# Patient Record
Sex: Female | Born: 1970
Health system: Southern US, Community
[De-identification: ages and names within clinical notes are randomized; demographics above are authoritative.]

## PROBLEM LIST (undated history)

## (undated) DIAGNOSIS — Z803 Family history of malignant neoplasm of breast: Secondary | ICD-10-CM

## (undated) DIAGNOSIS — Z227 Latent tuberculosis: Secondary | ICD-10-CM

## (undated) DIAGNOSIS — R112 Nausea with vomiting, unspecified: Secondary | ICD-10-CM

## (undated) DIAGNOSIS — Z8042 Family history of malignant neoplasm of prostate: Secondary | ICD-10-CM

## (undated) DIAGNOSIS — Z9889 Other specified postprocedural states: Secondary | ICD-10-CM

## (undated) DIAGNOSIS — K219 Gastro-esophageal reflux disease without esophagitis: Secondary | ICD-10-CM

## (undated) DIAGNOSIS — T7840XA Allergy, unspecified, initial encounter: Secondary | ICD-10-CM

## (undated) DIAGNOSIS — C50919 Malignant neoplasm of unspecified site of unspecified female breast: Secondary | ICD-10-CM

## (undated) DIAGNOSIS — Z800 Family history of malignant neoplasm of digestive organs: Secondary | ICD-10-CM

## (undated) HISTORY — DX: Gastro-esophageal reflux disease without esophagitis: K21.9

## (undated) HISTORY — PX: WISDOM TOOTH EXTRACTION: SHX21

## (undated) HISTORY — DX: Family history of malignant neoplasm of digestive organs: Z80.0

## (undated) HISTORY — DX: Latent tuberculosis: Z22.7

## (undated) HISTORY — DX: Family history of malignant neoplasm of breast: Z80.3

## (undated) HISTORY — PX: DILATION AND CURETTAGE OF UTERUS: SHX78

## (undated) HISTORY — DX: Family history of malignant neoplasm of prostate: Z80.42

## (undated) HISTORY — DX: Malignant neoplasm of unspecified site of unspecified female breast: C50.919

## (undated) HISTORY — DX: Allergy, unspecified, initial encounter: T78.40XA

---

## 2003-12-26 ENCOUNTER — Emergency Department (HOSPITAL_COMMUNITY): Admission: AD | Admit: 2003-12-26 | Discharge: 2003-12-26 | Payer: Self-pay | Admitting: Family Medicine

## 2004-02-27 ENCOUNTER — Other Ambulatory Visit: Admission: RE | Admit: 2004-02-27 | Discharge: 2004-02-27 | Payer: Self-pay | Admitting: Obstetrics and Gynecology

## 2004-12-18 ENCOUNTER — Ambulatory Visit: Payer: Self-pay | Admitting: Internal Medicine

## 2005-01-13 ENCOUNTER — Other Ambulatory Visit: Admission: RE | Admit: 2005-01-13 | Discharge: 2005-01-13 | Payer: Self-pay | Admitting: Obstetrics and Gynecology

## 2005-01-21 ENCOUNTER — Encounter (INDEPENDENT_AMBULATORY_CARE_PROVIDER_SITE_OTHER): Payer: Self-pay | Admitting: *Deleted

## 2005-01-21 ENCOUNTER — Ambulatory Visit (HOSPITAL_COMMUNITY): Admission: RE | Admit: 2005-01-21 | Discharge: 2005-01-21 | Payer: Self-pay | Admitting: Obstetrics and Gynecology

## 2005-07-09 ENCOUNTER — Encounter (INDEPENDENT_AMBULATORY_CARE_PROVIDER_SITE_OTHER): Payer: Self-pay | Admitting: Specialist

## 2005-07-09 ENCOUNTER — Ambulatory Visit (HOSPITAL_COMMUNITY): Admission: RE | Admit: 2005-07-09 | Discharge: 2005-07-09 | Payer: Self-pay | Admitting: Obstetrics and Gynecology

## 2005-11-19 ENCOUNTER — Ambulatory Visit: Payer: Self-pay | Admitting: Internal Medicine

## 2006-10-04 ENCOUNTER — Encounter: Admission: RE | Admit: 2006-10-04 | Discharge: 2006-11-07 | Payer: Self-pay | Admitting: Obstetrics and Gynecology

## 2006-10-24 ENCOUNTER — Observation Stay (HOSPITAL_COMMUNITY): Admission: AD | Admit: 2006-10-24 | Discharge: 2006-10-25 | Payer: Self-pay | Admitting: Obstetrics and Gynecology

## 2006-11-05 ENCOUNTER — Inpatient Hospital Stay (HOSPITAL_COMMUNITY): Admission: AD | Admit: 2006-11-05 | Discharge: 2006-11-05 | Payer: Self-pay | Admitting: Obstetrics and Gynecology

## 2006-11-08 ENCOUNTER — Encounter (INDEPENDENT_AMBULATORY_CARE_PROVIDER_SITE_OTHER): Payer: Self-pay | Admitting: Specialist

## 2006-11-08 ENCOUNTER — Inpatient Hospital Stay (HOSPITAL_COMMUNITY): Admission: AD | Admit: 2006-11-08 | Discharge: 2006-11-11 | Payer: Self-pay | Admitting: Obstetrics and Gynecology

## 2007-06-03 ENCOUNTER — Emergency Department (HOSPITAL_COMMUNITY): Admission: EM | Admit: 2007-06-03 | Discharge: 2007-06-03 | Payer: Self-pay | Admitting: Emergency Medicine

## 2007-07-29 DIAGNOSIS — J309 Allergic rhinitis, unspecified: Secondary | ICD-10-CM | POA: Insufficient documentation

## 2007-08-16 ENCOUNTER — Ambulatory Visit: Payer: Self-pay | Admitting: Internal Medicine

## 2008-06-12 ENCOUNTER — Emergency Department (HOSPITAL_COMMUNITY): Admission: EM | Admit: 2008-06-12 | Discharge: 2008-06-12 | Payer: Self-pay | Admitting: Family Medicine

## 2008-06-27 ENCOUNTER — Ambulatory Visit: Payer: Self-pay | Admitting: Internal Medicine

## 2009-05-31 ENCOUNTER — Ambulatory Visit: Payer: Self-pay | Admitting: Internal Medicine

## 2010-07-23 ENCOUNTER — Encounter: Admission: RE | Admit: 2010-07-23 | Discharge: 2010-07-23 | Payer: Self-pay | Admitting: Obstetrics and Gynecology

## 2011-01-04 ENCOUNTER — Encounter: Payer: Self-pay | Admitting: Obstetrics and Gynecology

## 2011-05-01 NOTE — Op Note (Signed)
NAME:  Megan [name], Megan [name] NO.:  [id number]   MEDICAL RECORD NO.:  [id number]          PATIENT TYPE:  INP   LOCATION:  9199                          FACILITY:  [facility]   PHYSICIAN:  Juluis Mire, M.D.   DATE OF BIRTH:  [birth year]   DATE OF PROCEDURE:  11/08/2006  DATE OF DISCHARGE:                               OPERATIVE REPORT   PREOP DIAGNOSIS:  Intrauterine pregnancy at 35 weeks with spontaneous  rupture of membranes; footling breech presentation;  and advanced  cervical dilatation.   POSTOP DIAGNOSIS:  Intrauterine pregnancy at 35 weeks with spontaneous  rupture of membranes; footling breech presentation;  and advanced  cervical dilatation.   PROCEDURE:  Low transverse cesarean section; however, we did have to T  the uterine incision.   ANESTHESIA:  Spinal.   ESTIMATED BLOOD LOSS:  Approximately 5-600 mL.   PACKS AND DRAINS:  None.   INTRAOPERATIVE BLOOD:  None.   COMPLICATIONS:  None.   INDICATIONS:  A  40 year old female presents to [facility] with spontaneous  rupture of membranes at 35 weeks.  Exams revealed a footling breech with  almost complete cervical dilatation and feet present in the vagina.  Her  prenatal course was complicated by gestational diabetes requiring  metformin.  The decision was to proceed with urgent cesarean section.  The risks were discussed with the risk of infection.  The risk of  hemorrhage that could require transfusion, risk of AIDS or hepatitis.  Risk of injury to adjacent organs.  This includes bladder, bowel,  ureters that could require further exploratory surgery.  Risk of deep  venous thrombosis and pulmonary embolus.  The patient expressed  understanding of the indications and risks   DESCRIPTION OF PROCEDURE:  The patient was taken to the OR, placed in  supine position with left lateral tilt.  After satisfactory level spinal  anesthesia obtained, the abdomen prepped out with Betadine and draped  into a sterile field.   A low transverse skin incision made with a knife and carried through  subcutaneous tissue.  The fascia was entered sharp and the incision  fashioned laterally, fascia taken off the muscle superiorly inferiorly.  Rectus muscles were separated midline.  The anterior peritoneum was  entered sharply and this incision into the peritoneum extended both  superiorly inferiorly.  A low transverse bladder flap was developed,  a  low transverse uterine incision begun with a knife extending laterally  using manual traction.   The infant was in footling breech  presentation.  We first delivered the  legs and we could get the baby down to the head and the uterus  contracted around that.  We had to T uterine incision to complete the  delivery the head. The infant was a viable female weighing was 5 pounds 11  ounces, Apgars were 7/9.  Umbilical artery pH was 7.26.  Placenta was  delivered manually.  Uterus was exteriorized for closure.   First the T part of the uterine incision was closed with a running  locking suture of zero chromic using a two-layer closure technique.  Then  the low transverse incision was closed with a running locking  suture of zero chromic using a two-layer closure technique.  Some  bleeding from the left side, brought under control with figure-of-eights  of zero chromic.  Urine output remained clear and adequate.  The uterus  was returned to the abdominal cavity.  We thoroughly irrigated the  pelvis and had good hemostasis.  Tubes and ovaries unremarkable.  Muscles and peritoneum closed with a suture of 3-0 Vicryl.  Fascia was  with a suture of zero PDS.  Skin was closed with staples and Steri-  Strips.  Sponge, instrument count reported as correct by circulating  nurse x2.  Foley catheter remained clear at time of closure.   The patient tolerated the procedure well and was returned to the  recovery room in good condition.      Juluis Mire, M.D.  Electronically  Signed     JSM/MEDQ  D:  11/08/2006  T:  11/08/2006  Job:  [id number]

## 2011-05-01 NOTE — Op Note (Signed)
NAME:  Megan [name], Megan [name]                ACCOUNT NO.:  [id number]   MEDICAL RECORD NO.:  [id number]          PATIENT TYPE:  AMB   LOCATION:  SDC                           [facility]ACILITY:  [facility]   PHYSICIAN:  Dineen Kid. Rana Snare, M.D.    DATE OF BIRTH:  1971-04-18   DATE OF PROCEDURE:  01/21/2005  DATE OF DISCHARGE:                                 OPERATIVE REPORT   PREOPERATIVE DIAGNOSES:  Missed abortion eight weeks estimated gestational  size.   POSTOPERATIVE DIAGNOSES:  Missed abortion eight weeks estimated gestational  size.   SURGEON:  Dineen Kid. Rana Snare, M.D.   PROCEDURE:  Dilation evacuation.   INDICATIONS FOR PROCEDURE:  Ms. Laski is a 40 year old, G2, P1 who  presented to the office for new OB evaluation.  Unable to hear the fetal  heart tones, she underwent an ultrasound showing an approximately eight week  size fetal demise consistent with a missed abortion. She presents today for  dilation and evacuation. The risks and benefits were discussed, informed  consent was obtained. Blood type is O positive.   ANESTHESIA:  Monitored anesthetic care and paracervical block.   DESCRIPTION OF PROCEDURE:  After adequate analgesia, the patient was placed  in the dorsal lithotomy position, she was sterilely prepped and draped. The  bladder was sterilely drained. A Graves speculum was placed, Tenaculum was  placed on the anterior lip of the cervix, paracervical block was placed with  1% Xylocaine with 1:100,000 epinephrine, 20 mL total used.  The uterus  sounded to 9 cm, use of the dilator to a #27 Pratt dilator. An 8 mm suction  curette was inserted, products of conception were retrieved. This is  performed until all products were retrieved and a gritty surface felt  throughout the endometrial cavity. The patient was given Methergine 0.2 mg  IM with good unit response.  The curette was then removed from the uterus,  tenaculum was then removed, cervix was noted to be hemostatic. The speculum  was then  removed and the patient transferred to the recovery room in stable  condition.  The patient received Toradol 30 mg IV. Estimated blood loss was  minimal.   DISPOSITION:  The patient will be discharged home to followup in the office  in 2-3 weeks, sent home with a routine instruction sheet for D&E, also a  prescription for doxycycline 100 mg p.o. b.i.d. x7 days and Methergine 0.2  mg to take three times a day for two days.      DCL/MEDQ  D:  01/21/2005  T:  01/21/2005  Job:  [id number]

## 2011-05-01 NOTE — H&P (Signed)
NAME:  LURLIE, Megan [name]                ACCOUNT NO.:  [id number]   MEDICAL RECORD NO.:  [id number]          PATIENT TYPE:  AMB   LOCATION:  SDC                           [facility]   PHYSICIAN:  Dineen Kid. Rana Snare, M.D.    DATE OF BIRTH:  [birth year]   DATE OF ADMISSION:  DATE OF DISCHARGE:                                HISTORY & PHYSICAL   HISTORY OF PRESENT ILLNESS:  Megan [name] is a 40 year old G3 P1 who presented  to the office today with cramping. She is in the first trimester. Ultrasound  evaluation today shows a fetus measuring 7-and-a-half weeks gestational age  with no fetal heart activity consistent with a fetal demise. She presents  for dilatation and evacuation. She had a previous miscarriage earlier this  year in February that required a D&E as well. Her blood type is O positive.   PAST MEDICAL HISTORY:  Negative.   PAST SURGICAL HISTORY:  Significant only for a D&E in February. She has had  a vaginal delivery in 2003.   PHYSICAL EXAMINATION:  VITAL SIGNS:  Her blood pressure is 118/60.  HEART:  Regular rate and rhythm.  LUNGS:  Clear to auscultation bilaterally.  ABDOMEN:  Nondistended, nontender. Uterus by ultrasound measuring 7-week  size consistent with a missed abortion.   IMPRESSION AND PLAN:  Missed abortion, approximately 7-and-a-half weeks  size, 9 weeks estimated gestational age. The patient desires dilation and  evacuation. The risks and benefits of the procedure were discussed at length  which include but are not limited to risk of infection, bleeding, damage to  uterus, tubes, ovaries, bowel, or bladder. Her blood type is O positive. She  does give her informed consent and wished to proceed. I did discuss  laboratory evaluation for habitual aborter. We will go ahead and perform  preliminary work with antinuclear antibody, lupus anticoagulant,  anticardiolipin antibody, glucose level, thyroid function test.       DCL/MEDQ  D:  07/08/2005  T:  07/08/2005   Job:  [id number]

## 2011-05-01 NOTE — Discharge Summary (Signed)
NAME:  Megan [name], Megan [name] NO.:  [id number]   MEDICAL RECORD NO.:  [id number]          PATIENT TYPE:  INP   LOCATION:  9110                          [facility]ACILITY:  [facility]   PHYSICIAN:  Zelphia Cairo, MD    DATE OF BIRTH:  1971-04-20   DATE OF ADMISSION:  11/08/2006  DATE OF DISCHARGE:  11/11/2006                               DISCHARGE SUMMARY   ADMITTING DIAGNOSES:  1. Intrauterine pregnancy at 35 weeks estimated gestational age.  2. Spontaneous rupture of membranes.  3. Footling breech presentation.   DISCHARGE DIAGNOSES:  1. Status post low transverse cesarean section.  2. Viable female infant.   PROCEDURE:  Primary low transverse cesarean section with T extension of  the incisional site.   REASON FOR ADMISSION:  Please see written H&P.   HOSPITAL COURSE:  The patient was a 40 year old female that presented to  [facility] with spontaneous rupture of membranes at 35  weeks estimated gestational age.  The exam revealed a footling breech  with almost complete cervical dilatation and feet present in the vagina.   The patient's prenatal course had been complicated by gestational  diabetes requiring metformin.  Decision was made to proceed with a  primary low transverse cesarean section.  The patient was then  transferred to the operating room, where spinal anesthesia was  administered without difficulty.  A low transverse incision was made  with delivery of the legs, so that we could get the baby down; but the  head was contracted by the uterus.  Therefore, a T uterine incision was  completed to deliver the head.  A viable female infant was delivered  weighing 5 pounds 11 ounces; Apgar's of 7 at one minute and 9 at 5  minutes.  Umbilical artery pH was 7.26.   The patient tolerated the procedure well and was taken to the recovery  room in stable condition.  In the recovery room the patient was noted to  have some decreased urine output.  IV fluids were  increased, with  increasing urine output.  On the following morning the patient was  without complaint.  She denied any dizziness.  She was ambulating well.  Vital signs were stable with temperature current 99.3.  Abdomen was  soft.  Fundus firm and nontender.  Abdominal dressing was noted to be  clean, dry and intact.  The patient was now voiding adequately.  Hemoglobin 8.5, platelet count 164,000, WBC count 23.1.  Fasting blood  sugar was 109 mg/dL.  CBC was ordered for the a.m. and the patient was  started on some iron supplementation.   On postoperative day #2 the patient was without complaint.  Vital signs  remained stable.  Abdomen soft.  Fundus firm and nontender.  Incision  was clean, dry and intact.  Repeat laboratory data revealed hemoglobin  of 8.1, platelet count 198,000, WBC count of 21.7.  Fasting blood sugar  was 219 mg/dL; however, the patient was noted to have orange juice prior  to fasting blood sugar being drawn.   On the following morning the patient was without complaint.  Vital signs  were stable.  Abdomen soft.  Fundus firm and nontender.  Incision was  clean, dry and intact.  Staples removed and the patient was later  discharged home.   CONDITION ON DISCHARGE:  Good.   DIET:  Regular as tolerated.   ACTIVITY:  No heavy lifting, no driving x2 weeks, no vaginal entry.   FOLLOW-UP:  To follow up in the office in 1-2 weeks for incision check.  She is to call for temperature greater than 100 degrees, persistent  nausea, vomiting, heavy vaginal bleeding and/or redness or drainage from  the incisional site.   DISCHARGE MEDICATIONS:  1. Percocet 5/325 mg (#31) p.o. q.4-6h. p.r.n.  2. Motrin 600 mg every 6 hours.  3. Prenatal vitamins one p.o. daily.  4. Colace one p.o. daily.      Julio Sicks, N.P.      Zelphia Cairo, MD  Electronically Signed    CC/MEDQ  D:  12/20/2006  T:  12/20/2006  Job:  [id number]

## 2011-05-01 NOTE — Op Note (Signed)
NAME:  Megan [name], Megan [name]                ACCOUNT NO.:  [id number]   MEDICAL RECORD NO.:  [id number]          PATIENT TYPE:  AMB   LOCATION:  SDC                           [facility]   PHYSICIAN:  Dineen Kid. Rana Snare, M.D.    DATE OF BIRTH:  09/26/1971   DATE OF PROCEDURE:  07/09/2005  DATE OF DISCHARGE:                                 OPERATIVE REPORT   PREOPERATIVE DIAGNOSIS:  Missed abortion at approximately 7-1/2 weeks'  gestational size.   POSTOPERATIVE DIAGNOSIS:  Missed abortion at approximately 7-1/2 weeks'  gestational size.   PROCEDURE:  Dilation and evacuation.   SURGEON:  Dineen Kid. Rana Snare, M.D.   ANESTHESIA:  Monitored anesthesia care and local by paracervical block.   INDICATIONS:  [name] is a 40 year old G3, P1, who presented to the  office with cramping.  Ultrasound shows a 7-1/2 week size fetus with no  fetal heart activity consistent with fetal demise.  She desires dilation and  evacuation.  The risks and benefits were discussed at length and informed  consent was obtained.  Blood type is O positive.   DESCRIPTION OF PROCEDURE:  After adequate analgesia, the patient was placed  in the dorsal lithotomy position, where she was sterilely prepped and  draped, the bladder was sterilely drained, a Graves speculum was placed.  A  tenaculum was placed on the anterior lip of the cervix.  A paracervical  block was placed with 1% Xylocaine and 1:100,000 epinephrine 20 mL total  used.  The uterus was sounded to 9 cm, easily dilated to a #27 Pratt  dilator.  A 7 mm suction curette was inserted.  Products of conception were  retrieved.  Suction curettage was performed until no residual products were  being retrieved and a gritty surface felt throughout the endometrial cavity.  The patient was given Methergine 0.2 mg IM and a good uterine response  noted.  The curette was then removed, the tenaculum released from the  cervix.  Hemostasis was achieved with direct pressure.  The  speculum was  then removed and the patient was transferred to the recovery room in stable  condition.  The patient received Toradol 30 mg IV.  Estimated blood loss was  minimal.   DISPOSITION:  The patient will be discharged home and will follow up in the  office in three weeks.  Sent home with a routine instruction sheet for D&E,  told to return for any increased pain, fever or bleeding.  Given a  prescription for Methergine 0.2 mg to take every eight hours for two days  and doxycycline 100 mg p.o. b.i.d. x7 days.       DCL/MEDQ  D:  07/09/2005  T:  07/09/2005  Job:  [id number]

## 2011-06-23 ENCOUNTER — Other Ambulatory Visit: Payer: Self-pay | Admitting: Internal Medicine

## 2011-12-31 ENCOUNTER — Ambulatory Visit (INDEPENDENT_AMBULATORY_CARE_PROVIDER_SITE_OTHER): Payer: 59 | Admitting: Family

## 2011-12-31 ENCOUNTER — Encounter: Payer: Self-pay | Admitting: Family

## 2011-12-31 VITALS — BP 110/82 | Temp 98.4°F | Wt 155.0 lb

## 2011-12-31 DIAGNOSIS — R05 Cough: Secondary | ICD-10-CM

## 2011-12-31 DIAGNOSIS — J069 Acute upper respiratory infection, unspecified: Secondary | ICD-10-CM

## 2011-12-31 MED ORDER — AMOXICILLIN-POT CLAVULANATE 875-125 MG PO TABS: 1.0000 | ORAL_TABLET | Freq: Two times a day (BID) | ORAL | Status: AC

## 2011-12-31 NOTE — Progress Notes (Signed)
  Subjective:    Patient ID: Megan [name], female    DOB: 10-01-1971, 41 y.o.   MRN: [id number]  HPI A 41 year old white female, nonsmoker, patient of Dr. Cato Mulligan is in today with complaints of cough, chest congestion, nasal congestion and one of her mouth. Her symptoms wax and wane. She's been taking Mucinex, Robitussin, and Flonase was little to no relief. She denies any fever, muscle aches or pain, shortness of breath, chest pain or palpitations.    Review of Systems  Constitutional: Negative.   HENT: Positive for congestion and postnasal drip.   Respiratory: Positive for cough.   Cardiovascular: Negative.   Genitourinary: Negative.   Musculoskeletal: Negative.   Skin: Negative.   Neurological: Negative.   Hematological: Negative.   Psychiatric/Behavioral: Negative.    Past Medical History  Diagnosis Date  . Allergic rhinitis     History   Social History  . Marital Status: Married    Spouse Name: N/A    Number of Children: N/A  . Years of Education: N/A   Occupational History  . Not on file.   Social History Main Topics  . Smoking status: Never Smoker   . Smokeless tobacco: Not on file  . Alcohol Use: No  . Drug Use: No  . Sexually Active: Not on file   Other Topics Concern  . 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 Wisdom tooth extraction     Family History  Problem Relation Age of Onset  . Breast cancer    . Hyperlipidemia    . Hypertension    . Stroke      No Known Allergies  Current Outpatient Prescriptions on File Prior to Visit  Medication Sig Dispense Refill  . fluticasone (FLONASE) 50 MCG/ACT nasal spray USE 2 SPRAYS IN EACH NOSTRIL DAILY  16 g  2    BP 110/82  Temp(Src) 98.4 F (36.9 C) (Oral)  Wt 155 lb (70.308 kg)chart   Objective:   Physical Exam  Constitutional: She is oriented to person, place, and time. She appears well-developed and  well-nourished.  HENT:  Right Ear: External ear normal.  Left Ear: External ear normal.  Nose: Nose normal.  Mouth/Throat: Oropharynx is clear and moist.  Neck: Neck supple.  Cardiovascular: Normal rate and normal heart sounds.   Pulmonary/Chest: Effort normal and breath sounds normal.  Neurological: She is alert and oriented to person, place, and time.  Skin: Skin is warm and dry.  Psychiatric: She has a normal mood and affect.          Assessment & Plan:  Assessment: Upper respiratory infection, cough  Plan: Augmentin 875 one by mouth twice a day x10 days. Continue Mucinex D. over-the-counter rest. Drink clear fluids. As his symptoms worsen or persist. Recheck as scheduled, and when necessary.

## 2011-12-31 NOTE — Patient Instructions (Signed)

## 2012-05-16 ENCOUNTER — Other Ambulatory Visit: Payer: Self-pay | Admitting: Internal Medicine

## 2012-06-07 ENCOUNTER — Ambulatory Visit (INDEPENDENT_AMBULATORY_CARE_PROVIDER_SITE_OTHER): Payer: 59 | Admitting: Family Medicine

## 2012-06-07 ENCOUNTER — Encounter: Payer: Self-pay | Admitting: Family Medicine

## 2012-06-07 VITALS — BP 106/70 | HR 91 | Temp 98.7°F | Wt 151.0 lb

## 2012-06-07 DIAGNOSIS — L259 Unspecified contact dermatitis, unspecified cause: Secondary | ICD-10-CM

## 2012-06-07 MED ORDER — METHYLPREDNISOLONE ACETATE 80 MG/ML IJ SUSP
120.0000 mg | Freq: Once | INTRAMUSCULAR | Status: AC
  Administered 2012-06-07: 120 mg via INTRAMUSCULAR

## 2012-06-07 MED ORDER — FLUTICASONE PROPIONATE 50 MCG/ACT NA SUSP: 2.0000 | Freq: Every day | NASAL | Status: DC

## 2012-06-07 MED ORDER — METHYLPREDNISOLONE 4 MG PO KIT: PACK | ORAL | Status: AC

## 2012-06-07 NOTE — Progress Notes (Signed)
  Subjective:    Patient ID: Megan [name], female    DOB: June 04, 1971, 41 y.o.   MRN: [id number]  HPI Here for itchy red rashes all over her body that started 4 days ago. Using Benadryl. She thinks this came from clearing some weeds from around her house.    Review of Systems  Constitutional: Negative.   Skin: Positive for rash.       Objective:   Physical Exam  Constitutional: She appears well-developed and well-nourished.  Skin:       Widespread small patches of red macules and vesicles on arms, legs, neck, and face           Assessment & Plan:  Recheck prn

## 2012-06-07 NOTE — Addendum Note (Signed)
Addended by: Aniceto Boss A on: 06/07/2012 03:32 PM   Modules accepted: Orders

## 2012-08-17 ENCOUNTER — Other Ambulatory Visit: Payer: Self-pay | Admitting: Obstetrics and Gynecology

## 2012-08-17 DIAGNOSIS — R928 Other abnormal and inconclusive findings on diagnostic imaging of breast: Secondary | ICD-10-CM

## 2012-08-19 ENCOUNTER — Ambulatory Visit
Admission: RE | Admit: 2012-08-19 | Discharge: 2012-08-19 | Disposition: A | Discharge status: Home / Self Care | Payer: 59 | Source: Ambulatory Visit | Attending: Obstetrics and Gynecology | Admitting: Obstetrics and Gynecology

## 2012-08-19 ENCOUNTER — Other Ambulatory Visit: Payer: Self-pay | Admitting: Obstetrics and Gynecology

## 2012-08-19 DIAGNOSIS — R928 Other abnormal and inconclusive findings on diagnostic imaging of breast: Secondary | ICD-10-CM

## 2012-08-24 ENCOUNTER — Other Ambulatory Visit: Payer: Self-pay | Admitting: Diagnostic Radiology

## 2012-08-24 ENCOUNTER — Ambulatory Visit
Admission: RE | Admit: 2012-08-24 | Discharge: 2012-08-24 | Disposition: A | Discharge status: Home / Self Care | Payer: 59 | Source: Ambulatory Visit | Attending: Obstetrics and Gynecology | Admitting: Obstetrics and Gynecology

## 2012-08-24 ENCOUNTER — Other Ambulatory Visit: Payer: Self-pay | Admitting: Obstetrics and Gynecology

## 2012-08-24 DIAGNOSIS — R928 Other abnormal and inconclusive findings on diagnostic imaging of breast: Secondary | ICD-10-CM

## 2012-08-24 HISTORY — PX: BREAST BIOPSY: SHX20

## 2013-03-14 ENCOUNTER — Other Ambulatory Visit: Payer: Self-pay | Admitting: Occupational Medicine

## 2013-03-14 ENCOUNTER — Ambulatory Visit: Payer: Self-pay

## 2013-03-14 DIAGNOSIS — R7612 Nonspecific reaction to cell mediated immunity measurement of gamma interferon antigen response without active tuberculosis: Secondary | ICD-10-CM

## 2013-07-31 ENCOUNTER — Other Ambulatory Visit: Payer: Self-pay | Admitting: Family Medicine

## 2013-09-08 ENCOUNTER — Other Ambulatory Visit: Payer: Self-pay

## 2013-09-08 DIAGNOSIS — Z1231 Encounter for screening mammogram for malignant neoplasm of breast: Secondary | ICD-10-CM

## 2013-09-26 ENCOUNTER — Ambulatory Visit: Payer: Self-pay

## 2013-10-10 ENCOUNTER — Ambulatory Visit
Admission: RE | Admit: 2013-10-10 | Discharge: 2013-10-10 | Disposition: A | Discharge status: Home / Self Care | Payer: 59 | Source: Ambulatory Visit

## 2013-10-10 DIAGNOSIS — Z1231 Encounter for screening mammogram for malignant neoplasm of breast: Secondary | ICD-10-CM

## 2013-11-17 ENCOUNTER — Encounter: Payer: Self-pay | Admitting: Family Medicine

## 2013-11-17 ENCOUNTER — Ambulatory Visit (INDEPENDENT_AMBULATORY_CARE_PROVIDER_SITE_OTHER): Payer: 59 | Admitting: Family Medicine

## 2013-11-17 VITALS — BP 110/78 | Temp 99.8°F | Wt 158.0 lb

## 2013-11-17 DIAGNOSIS — J309 Allergic rhinitis, unspecified: Secondary | ICD-10-CM

## 2013-11-17 DIAGNOSIS — Z23 Encounter for immunization: Secondary | ICD-10-CM

## 2013-11-17 MED ORDER — FLUTICASONE PROPIONATE 50 MCG/ACT NA SUSP: 2.0000 | Freq: Every day | NASAL | Status: DC

## 2013-11-17 NOTE — Progress Notes (Signed)
Chief Complaint  Patient presents with  . seasonal allergies    HPI:  Acute visit for:  Nasal Congestion: -reports this is her allergies -she reports has had this chronically and used flonase in the past and needs refills -symptoms: sneezing, nasal congestion, itchy eyes -has been using zyrtec which helps a little -denies: fevers, chills, sinus pain, ear pain, wheezing, cough, SOB  ROS: See pertinent positives and negatives per HPI.  Past Medical History  Diagnosis Date  . Allergic rhinitis     Past Surgical History  Procedure Laterality Date  . Wisdom tooth extraction      Family History  Problem Relation Age of Onset  . Breast cancer    . Hyperlipidemia    . Hypertension    . Stroke      History   Social History  . Marital Status: Married    Spouse Name: N/A    Number of Children: N/A  . Years of Education: N/A   Social History Main Topics  . Smoking status: Never Smoker   . Smokeless tobacco: Never Used  . Alcohol Use: Yes     Comment: twice a year  . Drug Use: No  . Sexual Activity: None   Other Topics Concern  . None   Social History Narrative   Occupation: Charity fundraiser   Married   Regular exercise- no    Current outpatient prescriptions:Calcium-Vitamin D (CALTRATE 600 PLUS-VIT D PO), Take by mouth., Disp: , Rfl: ;  oxymetazoline (AFRIN) 0.05 % nasal spray, Place 2 sprays into the nose 2 (two) times daily., Disp: , Rfl: ;  rifampin (RIFADIN) 300 MG capsule, Take 600 mg by mouth daily., Disp: , Rfl: ;  fluticasone (FLONASE) 50 MCG/ACT nasal spray, Place 2 sprays into both nostrils daily., Disp: 16 g, Rfl: 6  EXAM:  Filed Vitals:   11/17/13 0838  BP: 110/78  Temp: 99.8 F (37.7 C)    There is no height on file to calculate BMI.  GENERAL: vitals reviewed and listed above, alert, oriented, appears well hydrated and in no acute distress  HEENT: atraumatic, conjunttiva clear, no obvious abnormalities on inspection of external nose and ears  NECK: no  obvious masses on inspection  LUNGS: clear to auscultation bilaterally, no wheezes, rales or rhonchi, good air movement  CV: HRRR, no peripheral edema  MS: moves all extremities without noticeable abnormality  PSYCH: pleasant and cooperative, no obvious depression or anxiety  ASSESSMENT AND PLAN:  Discussed the following assessment and plan:  Need for prophylactic vaccination and inoculation against influenza - Plan: Flu Vaccine QUAD 36+ mos PF IM (Fluarix)  ALLERGIC RHINITIS - Plan: fluticasone (FLONASE) 50 MCG/ACT nasal spray  -refilled flonase -discussed allergy treatment -Patient advised to return or notify a doctor immediately if symptoms worsen or persist or new concerns arise.  There are no Patient Instructions on file for this visit.   Kriste Basque R.

## 2013-11-17 NOTE — Progress Notes (Signed)
Pre visit review using our clinic review tool, if applicable. No additional management support is needed unless otherwise documented below in the visit note. 

## 2014-10-26 ENCOUNTER — Other Ambulatory Visit: Payer: Self-pay

## 2014-10-26 DIAGNOSIS — Z1231 Encounter for screening mammogram for malignant neoplasm of breast: Secondary | ICD-10-CM

## 2014-11-20 ENCOUNTER — Ambulatory Visit
Admission: RE | Admit: 2014-11-20 | Discharge: 2014-11-20 | Disposition: A | Discharge status: Home / Self Care | Payer: 59 | Source: Ambulatory Visit

## 2014-11-20 DIAGNOSIS — Z1231 Encounter for screening mammogram for malignant neoplasm of breast: Secondary | ICD-10-CM

## 2014-12-11 ENCOUNTER — Telehealth: Payer: Self-pay | Admitting: Internal Medicine

## 2014-12-11 NOTE — Telephone Encounter (Signed)
Pt would like to switch to dr kim. Can I sch?

## 2014-12-11 NOTE — Telephone Encounter (Signed)
Sure. Please schedule npv. Thanks.

## 2014-12-13 NOTE — Telephone Encounter (Signed)
lmom for pt to cb

## 2014-12-17 NOTE — Telephone Encounter (Signed)
Pt has been sch

## 2015-01-22 ENCOUNTER — Ambulatory Visit (INDEPENDENT_AMBULATORY_CARE_PROVIDER_SITE_OTHER): Payer: 59 | Admitting: Family Medicine

## 2015-01-22 ENCOUNTER — Encounter: Payer: Self-pay | Admitting: Family Medicine

## 2015-01-22 VITALS — BP 128/80 | HR 71 | Temp 98.3°F | Ht 65.25 in | Wt 169.1 lb

## 2015-01-22 DIAGNOSIS — Z7689 Persons encountering health services in other specified circumstances: Secondary | ICD-10-CM

## 2015-01-22 DIAGNOSIS — Z7189 Other specified counseling: Secondary | ICD-10-CM

## 2015-01-22 DIAGNOSIS — Z0000 Encounter for general adult medical examination without abnormal findings: Secondary | ICD-10-CM

## 2015-01-22 DIAGNOSIS — R5383 Other fatigue: Secondary | ICD-10-CM

## 2015-01-22 DIAGNOSIS — Z23 Encounter for immunization: Secondary | ICD-10-CM

## 2015-01-22 NOTE — Progress Notes (Signed)
HPI:  Megan [name] is here to establish care. She used to see Dr. Leanne Chang. Sees Dr. Corinna Capra for physicals - has yearly paps as has remote hx mildly abnormal pap, mammos. Wants her basic labs, preventive care.  Has the following chronic problems that require follow up and concerns today:  Chronic fatigue: -started over 1 year ago: -feels like in good health, CV exercise on a regular basis, diet is pretty healthy -symptoms: mild fatigue intermittently and has had some weight gain over the last year -Wants to check labs -denies: weight loss, fevers, night sweats, cough, SOB or any other symptoms, anxiety or depression -hx positive quantiferon - s/p treatment, always had neg CXR, has had several PPD tests since which were negative as well -FDLMP: has IUD so rarely has menstrual bleeding  Vaccines: Tdap today  Declined STI testing  Seeing gyn for her breast cancer and followed more closely due to [place] breast Ca.  ROS negative for unless reported above: fevers, unintentional weight loss, hearing or vision loss, chest pain, palpitations, struggling to breath, hemoptysis, melena, hematochezia, hematuria, falls, loc, si, thoughts of self harm  Past Medical History  Diagnosis Date  . Allergic rhinitis   . TB lung, latent     +  quantiferon, neg PPD - s/p treatment - completed    Past Surgical History  Procedure Laterality Date  . Wisdom tooth extraction    . Dilation and curettage of uterus    . Cesarean section      Family History  Problem Relation Age of Onset  . Cancer Mother     breast  . Hypertension Mother   . Hyperlipidemia Father   . Diabetes Father   . Stroke Maternal Grandmother     History   Social History  . Marital Status: Married    Spouse Name: N/A    Number of Children: N/A  . Years of Education: N/A   Social History Main Topics  . Smoking status: Never Smoker   . Smokeless tobacco: Never Used  . Alcohol Use: Yes     Comment: twice a year  . Drug Use:  No  . Sexual Activity: None   Other Topics Concern  . 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  levonorgestrel (MIRENA) 20 MCG/24HR IUD, 1 each by Intrauterine route once., Disp: , Rfl:  .  Multiple Vitamin (MULTIVITAMIN) capsule, Take 1 capsule by mouth daily., Disp: , Rfl:   EXAM:  Filed Vitals:   01/22/15 1426  BP: 128/80  Pulse: 71  Temp: 98.3 F (36.8 C)    Body mass index is 27.94 kg/(m^2).  GENERAL: vitals reviewed and listed above, alert, oriented, appears well hydrated and in no acute distress  HEENT: atraumatic, conjunttiva clear, no obvious abnormalities on inspection of external nose and ears  NECK: no obvious masses on inspection  LUNGS: clear to auscultation bilaterally, no wheezes, rales or rhonchi, good air movement  CV: HRRR, no peripheral edema  ABD: BS+, soft, NTTP  GU: declined - sees gyn  BREAST: declined - sees gyn  MS: moves all extremities without noticeable abnormality  PSYCH: pleasant and cooperative, no obvious depression or anxiety  ASSESSMENT AND PLAN:  Discussed the following assessment and plan:  Visit for preventive health  examination - Plan: Lipid Panel, CMP, CBC with Differential, TSH  Other fatigue - Plan: CBC with Differential, TSH  Encounter to establish care   -We reviewed the PMH, PSH, FH, SH, Meds and Allergies. -We provided refills for any medications we will prescribe as needed. -We addressed current concerns per orders and patient instructions. -We have asked for records for pertinent exams, studies, vaccines and notes from previous providers. -We have advised patient to follow up per instructions below. -all uspstf level a and b recs discussed -she will set up FASTING lab appointment -Tdap   -Patient advised to return or notify  a doctor immediately if symptoms worsen or persist or new concerns arise.  Patient Instructions  BEFORE YOU LEAVE: -schedule fasting lab appointment - drink plenty of water -Tdap  We recommend the following healthy lifestyle measures: - eat a healthy diet consisting of lots of vegetables, fruits, beans, nuts, seeds, healthy meats such as white chicken and fish and whole grains.  - avoid fried foods, fast food, processed foods, sodas, red meet and other fattening foods.  - get a least 150 minutes of aerobic exercise per week.   Get at least 8 hours of sleep per night and try to allow yourself to sleep in one day per week if possible     KIM, HANNAH R.

## 2015-01-22 NOTE — Progress Notes (Signed)
Pre visit review using our clinic review tool, if applicable. No additional management support is needed unless otherwise documented below in the visit note. 

## 2015-01-22 NOTE — Addendum Note (Signed)
Addended by: Agnes Lawrence on: 01/22/2015 05:27 PM   Modules accepted: Orders

## 2015-01-22 NOTE — Patient Instructions (Signed)
BEFORE YOU LEAVE: -schedule fasting lab appointment - drink plenty of water -Tdap  We recommend the following healthy lifestyle measures: - eat a healthy diet consisting of lots of vegetables, fruits, beans, nuts, seeds, healthy meats such as white chicken and fish and whole grains.  - avoid fried foods, fast food, processed foods, sodas, red meet and other fattening foods.  - get a least 150 minutes of aerobic exercise per week.   Get at least 8 hours of sleep per night and try to allow yourself to sleep in one day per week if possible

## 2015-01-25 ENCOUNTER — Other Ambulatory Visit (INDEPENDENT_AMBULATORY_CARE_PROVIDER_SITE_OTHER): Payer: 59

## 2015-01-25 DIAGNOSIS — R5383 Other fatigue: Secondary | ICD-10-CM

## 2015-01-25 DIAGNOSIS — Z0000 Encounter for general adult medical examination without abnormal findings: Secondary | ICD-10-CM

## 2015-01-25 LAB — CBC WITH DIFFERENTIAL/PLATELET
BASOS PCT: 0.5 % (ref 0.0–3.0)
Basophils Absolute: 0 10*3/uL (ref 0.0–0.1)
Eosinophils Absolute: 0.1 10*3/uL (ref 0.0–0.7)
Eosinophils Relative: 1.3 % (ref 0.0–5.0)
HCT: 39.6 % (ref 36.0–46.0)
Hemoglobin: 13.5 g/dL (ref 12.0–15.0)
LYMPHS PCT: 28.3 % (ref 12.0–46.0)
Lymphs Abs: 1.8 10*3/uL (ref 0.7–4.0)
MCHC: 34.2 g/dL (ref 30.0–36.0)
MCV: 91.8 fl (ref 78.0–100.0)
Monocytes Absolute: 0.5 10*3/uL (ref 0.1–1.0)
Monocytes Relative: 8.5 % (ref 3.0–12.0)
Neutro Abs: 3.9 10*3/uL (ref 1.4–7.7)
Neutrophils Relative %: 61.4 % (ref 43.0–77.0)
PLATELETS: 251 10*3/uL (ref 150.0–400.0)
RBC: 4.32 Mil/uL (ref 3.87–5.11)
RDW: 13.3 % (ref 11.5–15.5)
WBC: 6.4 10*3/uL (ref 4.0–10.5)

## 2015-01-25 LAB — LIPID PANEL
Cholesterol: 147 mg/dL (ref 0–200)
HDL: 45.5 mg/dL (ref >39.00)
LDL Cholesterol: 82 mg/dL (ref 0–99)
NONHDL: 101.5
Total CHOL/HDL Ratio: 3
Triglycerides: 98 mg/dL (ref 0.0–149.0)
VLDL: 19.6 mg/dL (ref 0.0–40.0)

## 2015-01-25 LAB — COMPREHENSIVE METABOLIC PANEL
ALBUMIN: 4.4 g/dL (ref 3.5–5.2)
ALT: 16 U/L (ref 0–35)
AST: 16 U/L (ref 0–37)
Alkaline Phosphatase: 84 U/L (ref 39–117)
BILIRUBIN TOTAL: 0.7 mg/dL (ref 0.2–1.2)
BUN: 12 mg/dL (ref 6–23)
CO2: 28 mEq/L (ref 19–32)
Calcium: 9.3 mg/dL (ref 8.4–10.5)
Chloride: 105 mEq/L (ref 96–112)
Creatinine, Ser: 0.71 mg/dL (ref 0.40–1.20)
GFR: 96.24 mL/min (ref >60.00)
Glucose, Bld: 91 mg/dL (ref 70–99)
POTASSIUM: 4.1 meq/L (ref 3.5–5.1)
SODIUM: 138 meq/L (ref 135–145)
TOTAL PROTEIN: 7.4 g/dL (ref 6.0–8.3)

## 2015-01-25 LAB — TSH: TSH: 1.4 u[IU]/mL (ref 0.35–4.50)

## 2015-04-26 ENCOUNTER — Other Ambulatory Visit: Payer: Self-pay | Admitting: Obstetrics and Gynecology

## 2015-04-29 LAB — CYTOLOGY - PAP

## 2015-12-13 ENCOUNTER — Encounter: Payer: Self-pay | Admitting: Family Medicine

## 2015-12-13 ENCOUNTER — Ambulatory Visit (INDEPENDENT_AMBULATORY_CARE_PROVIDER_SITE_OTHER): Payer: 59 | Admitting: Family Medicine

## 2015-12-13 VITALS — BP 120/80 | HR 95 | Temp 98.6°F | Ht 65.25 in | Wt 169.9 lb

## 2015-12-13 DIAGNOSIS — J309 Allergic rhinitis, unspecified: Secondary | ICD-10-CM | POA: Diagnosis not present

## 2015-12-13 MED ORDER — FLUTICASONE PROPIONATE 50 MCG/ACT NA SUSP: 2.0000 | Freq: Every day | NASAL | Status: DC

## 2015-12-13 NOTE — Patient Instructions (Signed)
BEFORE YOU LEAVE: -schedule follow up in 1 month for allergies  Start flonase 2 sprays each nostril daily for 1 month, then 1 spray each nostril daily  Stop the claritin-D and start allegra or zyrtec daily

## 2015-12-13 NOTE — Progress Notes (Signed)
   HPI:  Acute visit for:  Allergic Rhinitis: -started: chronic, worse for several months -symptoms:nasal congestion, sneezing, itchy nose, PND -denies:fever, SOB, NVD, tooth pain, sinus pain -has tried: claritin d, used to use flonase and this worked great but then cost too much over the counter so stopped -sick contacts/travel/risks: denies flu exposure, tick exposure or or Ebola risks  ROS: See pertinent positives and negatives per HPI.  Past Medical History  Diagnosis Date  . Allergic rhinitis   . TB lung, latent     +  quantiferon, neg PPD - s/p treatment - completed    Past Surgical History  Procedure Laterality Date  . Wisdom tooth extraction    . Dilation and curettage of uterus    . Cesarean section      Family History  Problem Relation Age of Onset  . Cancer Mother     breast  . Hypertension Mother   . Hyperlipidemia Father   . Diabetes Father   . Stroke Maternal Grandmother     Social History   Social History  . Marital Status: Married    Spouse Name: N/A  . Number of Children: N/A  . Years of Education: N/A   Social History Main Topics  . Smoking status: Never Smoker   . Smokeless tobacco: Never Used  . Alcohol Use: Yes     Comment: twice a year  . Drug Use: No  . Sexual Activity: Not Asked   Other Topics Concern  . 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  levonorgestrel (MIRENA) 20 MCG/24HR IUD, 1 each by Intrauterine route once., Disp: , Rfl:  .  Multiple Vitamin (MULTIVITAMIN) capsule, Take 1 capsule by mouth daily., Disp: , Rfl:  .  fluticasone (FLONASE) 50 MCG/ACT nasal spray, Place 2 sprays into both nostrils daily., Disp: 16 g, Rfl: 6  EXAM:  Filed Vitals:   12/13/15 1051  BP: 120/80  Pulse: 95  Temp: 98.6 F (37 C)    Body mass index is 28.07  kg/(m^2).  GENERAL: vitals reviewed and listed above, alert, oriented, appears well hydrated and in no acute distress  HEENT: atraumatic, conjunttiva clear, no obvious abnormalities on inspection of external nose and ears, normal appearance of ear canals and TMs, clear nasal congestion, boggy pale turbinates, mild post oropharyngeal erythema with PND, no tonsillar edema or exudate, no sinus TTP  NECK: no obvious masses on inspection  LUNGS: clear to auscultation bilaterally, no wheezes, rales or rhonchi, good air movement  CV: HRRR, no peripheral edema  MS: moves all extremities without noticeable abnormality  PSYCH: pleasant and cooperative, no obvious depression or anxiety  ASSESSMENT AND PLAN:  Discussed the following assessment and plan:  Allergic rhinitis, unspecified allergic rhinitis type  -stop allegra d -add INS -use zyrtec or allegra -follow up in 1 month -of course, we advised to return or notify a doctor immediately if symptoms worsen or persist or new concerns arise.    Patient Instructions  BEFORE YOU LEAVE: -schedule follow up in 1 month for allergies  Start flonase 2 sprays each nostril daily for 1 month, then 1 spray each nostril daily  Stop the claritin-D and start allegra or zyrtec daily     Avrianna Smart R.

## 2015-12-13 NOTE — Progress Notes (Signed)
Pre visit review using our clinic review tool, if applicable. No additional management support is needed unless otherwise documented below in the visit note. 

## 2015-12-15 DIAGNOSIS — A4902 Methicillin resistant Staphylococcus aureus infection, unspecified site: Secondary | ICD-10-CM

## 2015-12-15 HISTORY — DX: Methicillin resistant Staphylococcus aureus infection, unspecified site: A49.02

## 2016-01-14 ENCOUNTER — Encounter: Payer: Self-pay | Admitting: Family Medicine

## 2016-01-14 ENCOUNTER — Ambulatory Visit (INDEPENDENT_AMBULATORY_CARE_PROVIDER_SITE_OTHER): Payer: 59 | Admitting: Family Medicine

## 2016-01-14 VITALS — BP 108/80 | HR 71 | Temp 98.8°F | Ht 65.25 in | Wt 170.7 lb

## 2016-01-14 DIAGNOSIS — J309 Allergic rhinitis, unspecified: Secondary | ICD-10-CM

## 2016-01-14 NOTE — Progress Notes (Signed)
Pre visit review using our clinic review tool, if applicable. No additional management support is needed unless otherwise documented below in the visit note. 

## 2016-01-14 NOTE — Progress Notes (Signed)
  HPI:  Allergic Rhinitis: -had severe nasal congestion, snoring, PND -advised antihistamine and INS -reports amazing resolution of symptoms -using flonase 2 sprays each nostril, no antihistamine -denies: nose bleeds, breathing issues, sinus pain  ROS: See pertinent positives and negatives per HPI.  Past Medical History  Diagnosis Date  . Allergic rhinitis   . TB lung, latent     +  quantiferon, neg PPD - s/p treatment - completed    Past Surgical History  Procedure Laterality Date  . Wisdom tooth extraction    . Dilation and curettage of uterus    . Cesarean section      Family History  Problem Relation Age of Onset  . Cancer Mother     breast  . Hypertension Mother   . Hyperlipidemia Father   . Diabetes Father   . Stroke Maternal Grandmother     Social History   Social History  . Marital Status: Married    Spouse Name: N/A  . Number of Children: N/A  . Years of Education: N/A   Social History Main Topics  . Smoking status: Never Smoker   . Smokeless tobacco: Never Used  . Alcohol Use: Yes     Comment: twice a year  . Drug Use: No  . Sexual Activity: Not Asked   Other Topics Concern  . 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  fluticasone (FLONASE) 50 MCG/ACT nasal spray, Place 2 sprays into both nostrils daily., Disp: 16 g, Rfl: 6 .  levonorgestrel (MIRENA) 20 MCG/24HR IUD, 1 each by Intrauterine route once., Disp: , Rfl:  .  Multiple Vitamin (MULTIVITAMIN) capsule, Take 1 capsule by mouth daily., Disp: , Rfl:   EXAM:  Filed Vitals:   01/14/16 1052  BP: 108/80  Pulse: 71  Temp: 98.8 F (37.1 C)    Body mass index is 28.2 kg/(m^2).  GENERAL: vitals reviewed and listed above, alert, oriented, appears well hydrated and in no acute distress  HEENT: atraumatic,  conjunttiva clear, no obvious abnormalities on inspection of external nose and ears, normal appearance of ear canals and TMs, no nasal congestion, mild post oropharyngeal erythema with PND, no tonsillar edema or exudate, no sinus TTP  NECK: no obvious masses on inspection  MS: moves all extremities without noticeable abnormality  PSYCH: pleasant and cooperative, no obvious depression or anxiety  ASSESSMENT AND PLAN:  Discussed the following assessment and plan:  Allergic rhinitis, unspecified allergic rhinitis type  -dong great, decrease dose flonase to 1 spray each nostril -follow up as needed and yearly -Patient advised to return or notify a doctor immediately if symptoms worsen or persist or new concerns arise.  There are no Patient Instructions on file for this visit.   Colin Benton R.

## 2016-02-11 ENCOUNTER — Other Ambulatory Visit: Payer: Self-pay

## 2016-02-11 DIAGNOSIS — Z1231 Encounter for screening mammogram for malignant neoplasm of breast: Secondary | ICD-10-CM

## 2016-02-12 MED FILL — FLUTICASONE PROP 50 MCG SPR: 50 | 30 days supply | Qty: 16 | Fill #1

## 2016-02-28 ENCOUNTER — Ambulatory Visit
Admission: RE | Admit: 2016-02-28 | Discharge: 2016-02-28 | Disposition: A | Discharge status: Home / Self Care | Payer: 59 | Source: Ambulatory Visit

## 2016-02-28 DIAGNOSIS — Z1231 Encounter for screening mammogram for malignant neoplasm of breast: Secondary | ICD-10-CM

## 2016-04-28 DIAGNOSIS — Z01419 Encounter for gynecological examination (general) (routine) without abnormal findings: Secondary | ICD-10-CM | POA: Diagnosis not present

## 2016-04-28 DIAGNOSIS — Z6827 Body mass index (BMI) 27.0-27.9, adult: Secondary | ICD-10-CM | POA: Diagnosis not present

## 2016-04-28 DIAGNOSIS — N951 Menopausal and female climacteric states: Secondary | ICD-10-CM | POA: Diagnosis not present

## 2016-06-02 ENCOUNTER — Encounter: Payer: Self-pay | Admitting: Family Medicine

## 2016-06-02 ENCOUNTER — Ambulatory Visit (INDEPENDENT_AMBULATORY_CARE_PROVIDER_SITE_OTHER): Payer: 59 | Admitting: Family Medicine

## 2016-06-02 VITALS — BP 130/70 | HR 76 | Temp 98.3°F | Ht 65.25 in | Wt 166.5 lb

## 2016-06-02 DIAGNOSIS — L089 Local infection of the skin and subcutaneous tissue, unspecified: Secondary | ICD-10-CM

## 2016-06-02 DIAGNOSIS — L03317 Cellulitis of buttock: Secondary | ICD-10-CM | POA: Diagnosis not present

## 2016-06-02 DIAGNOSIS — L0889 Other specified local infections of the skin and subcutaneous tissue: Secondary | ICD-10-CM

## 2016-06-02 MED ORDER — SULFAMETHOXAZOLE-TRIMETHOPRIM 800-160 MG PO TABS: 1.0000 | ORAL_TABLET | Freq: Two times a day (BID) | ORAL | Status: DC

## 2016-06-02 MED ORDER — CEPHALEXIN 500 MG PO CAPS: 500.0000 mg | ORAL_CAPSULE | Freq: Three times a day (TID) | ORAL | Status: DC

## 2016-06-02 MED FILL — SULFAMETHOXAZOLE/TMP DS TAB: 800-160 | 5 days supply | Qty: 10 | Fill #0

## 2016-06-02 MED FILL — CEPHALEXIN 500 MG CAPSULE: 500 | 5 days supply | Qty: 15 | Fill #0

## 2016-06-02 MED FILL — FLUTICASONE PROP 50 MCG SPR: 50 | 30 days supply | Qty: 16 | Fill #2

## 2016-06-02 NOTE — Patient Instructions (Signed)
Follow-up in 2 days on Thursday.   Seek care sooner if symptoms are worsening, spreading redness or fevers or sickness.   Start antibiotics today. Cannot be pregnant while taking these antibiotics.

## 2016-06-02 NOTE — Progress Notes (Signed)
Pre visit review using our clinic review tool, if applicable. No additional management support is needed unless otherwise documented below in the visit note. 

## 2016-06-02 NOTE — Progress Notes (Signed)
HPI:   Megan [name] is a pleasant 45 year old here for an acute visit for a painful skin lesion on the left buttock. She reports this started about 3 days ago as a small pimple. Now she has developed some redness around the area. Denies drainage, fevers, malaise. Does not believe she had an insect bite in this area. She will be leaving for [place] this weekend and will be gone for 2 weeks.  ROS: See pertinent positives and negatives per HPI.  Past Medical History  Diagnosis Date  . Allergic rhinitis   . TB lung, latent     +  quantiferon, neg PPD - s/p treatment - completed    Past Surgical History  Procedure Laterality Date  . Wisdom tooth extraction    . Dilation and curettage of uterus    . Cesarean section      Family History  Problem Relation Age of Onset  . Cancer Mother     breast  . Hypertension Mother   . Hyperlipidemia Father   . Diabetes Father   . Stroke Maternal Grandmother     Social History   Social History  . Marital Status: Married    Spouse Name: N/A  . Number of Children: N/A  . Years of Education: N/A   Social History Main Topics  . Smoking status: Never Smoker   . Smokeless tobacco: Never Used  . Alcohol Use: Yes     Comment: twice a year  . Drug Use: No  . Sexual Activity: Not Asked   Other Topics Concern  . 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  fluticasone (FLONASE) 50 MCG/ACT nasal spray, Place 2 sprays into both nostrils daily., Disp: 16 g, Rfl: 6 .  levonorgestrel (MIRENA) 20 MCG/24HR IUD, 1 each by Intrauterine route once., Disp: , Rfl:  .  Multiple Vitamin (MULTIVITAMIN) capsule, Take 1 capsule by mouth daily., Disp: , Rfl:  .  cephALEXin (KEFLEX) 500 MG capsule, Take 1 capsule (500 mg total) by mouth 3 (three) times daily., Disp: 15 capsule, Rfl: 0 .   sulfamethoxazole-trimethoprim (BACTRIM DS,SEPTRA DS) 800-160 MG tablet, Take 1 tablet by mouth 2 (two) times daily., Disp: 10 tablet, Rfl: 0  EXAM:  Filed Vitals:   06/02/16 1553  BP: 130/70  Pulse: 76  Temp: 98.3 F (36.8 C)    Body mass index is 27.51 kg/(m^2).  GENERAL: vitals reviewed and listed above, alert, oriented, appears well hydrated and in no acute distress  HEENT: atraumatic, conjunttiva clear, no obvious abnormalities on inspection of external nose and ears  NECK: no obvious masses on inspection  SKIN:  Small pustule left buttock, some erythema and induration in an area about the size of a small orange, no fluctuance or abscess appreciated on exam. I was able to express the entire contents of the pustule and this material was sent for a wound culture.  MS: moves all extremities without noticeable abnormality  PSYCH: pleasant and cooperative, no obvious depression or anxiety  ASSESSMENT AND PLAN:  Discussed the following assessment and plan:  Pustule - Plan: WOUND CULTURE  Cellulitis of buttock - Plan: cephALEXin (KEFLEX) 500 MG capsule, sulfamethoxazole-trimethoprim (BACTRIM DS,SEPTRA DS) 800-160 MG tablet  - TX with ABX including MRSA coverage given pustule -Follow up in 2  days; advised she seek care immediately if worsening, spreading redness, fever or malaise -Patient advised to return or notify a doctor immediately if symptoms worsen or persist or new concerns arise.  Patient Instructions   Follow-up in 2 days on Thursday.   Seek care sooner if symptoms are worsening, spreading redness or fevers or sickness.   Start antibiotics today. Cannot be pregnant while taking these antibiotics.       Colin Benton R.

## 2016-06-04 ENCOUNTER — Ambulatory Visit (INDEPENDENT_AMBULATORY_CARE_PROVIDER_SITE_OTHER): Payer: 59 | Admitting: Family Medicine

## 2016-06-04 ENCOUNTER — Encounter: Payer: Self-pay | Admitting: Family Medicine

## 2016-06-04 VITALS — BP 110/70 | HR 79 | Temp 98.3°F | Resp 12 | Ht 65.25 in | Wt 167.4 lb

## 2016-06-04 DIAGNOSIS — L03317 Cellulitis of buttock: Secondary | ICD-10-CM | POA: Diagnosis not present

## 2016-06-04 DIAGNOSIS — L0231 Cutaneous abscess of buttock: Secondary | ICD-10-CM | POA: Diagnosis not present

## 2016-06-04 MED ORDER — SULFAMETHOXAZOLE-TRIMETHOPRIM 800-160 MG PO TABS: 1.0000 | ORAL_TABLET | Freq: Two times a day (BID) | ORAL | Status: AC

## 2016-06-04 NOTE — Patient Instructions (Signed)
A few things to remember from today's visit:   1. Abscess of buttock, left  Complete 7-8 days of Bactrim. Stop Keflex. Warm compresses.  Monitor for warning signs and f/u as needed.    - sulfamethoxazole-trimethoprim (BACTRIM DS,SEPTRA DS) 800-160 MG tablet; Take 1 tablet by mouth 2 (two) times daily.  Dispense: 6 tablet; Refill: 0

## 2016-06-04 NOTE — Progress Notes (Signed)
Pre visit review using our clinic review tool, if applicable. No additional management support is needed unless otherwise documented below in the visit note. 

## 2016-06-04 NOTE — Progress Notes (Signed)
HPI:   [name] is a 45 y.o. female, who is here today to follow on recent office visit.  She was seen on 06/02/16 because erythematosus lesion on left buttocks.  She was started on Keflex and Bactrim, 2nd day today. She has tolerated medication well and denies any side effect. Overall she is feeling much better: lesion has decreased in size, pain has improved as well as erythema. She denies any fever, chills, or myalgias. She has been applying warm compresses and has noted yellowish drainage upon applying pressure.  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     Results for orders placed or performed in visit on 06/02/16  WOUND CULTURE     Status: None (Preliminary result)   Collection Time: 06/02/16  5:08 PM  Result Value Ref Range Status   Gram Stain No WBC Seen  Preliminary   Gram Stain No Squamous Epithelial Cells Seen  Preliminary   Gram Stain No Organisms Seen  Preliminary   Preliminary Report Moderate STAPHYLOCOCCUS AUREUS  Preliminary    Comment: Rifampin and Gentamicin should not be used as single drugs for treatment of Staph infections.     Concerns today: None.   Review of Systems  Constitutional: Negative for fever, chills, activity change, appetite change and fatigue.  HENT: Negative for mouth sores, sore throat and trouble swallowing.   Eyes: Negative for visual disturbance.  Respiratory: Negative for cough, shortness of breath and wheezing.   Gastrointestinal: Negative for nausea, vomiting and abdominal pain.       No changes in bowel habits.  Musculoskeletal: Negative for myalgias and back pain.  Neurological: Negative for syncope, weakness, numbness and headaches.  Hematological: Negative for adenopathy.      Current Outpatient Prescriptions on File Prior to Visit  Medication Sig Dispense Refill  .  fluticasone (FLONASE) 50 MCG/ACT nasal spray Place 2 sprays into both nostrils daily. 16 g 6  . levonorgestrel (MIRENA) 20 MCG/24HR IUD 1 each by Intrauterine route once.    . Multiple Vitamin (MULTIVITAMIN) capsule Take 1 capsule by mouth daily.     No current facility-administered medications on file prior to visit.     Past Medical History  Diagnosis Date  . Allergic rhinitis   . TB lung, latent     +  quantiferon, neg PPD - s/p treatment - completed   No Known Allergies  Social History   Social History  . Marital Status: Married    Spouse Name: N/A  . Number of Children: N/A  . Years of Education: N/A   Social History Main Topics  . Smoking status: Never Smoker   . Smokeless tobacco: Never Used  . Alcohol Use: Yes     Comment: twice a year  . Drug Use: No  . Sexual Activity: Not Asked   Other Topics Concern  . 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      Physical Exam  Constitutional: She is oriented  to person, place, and time. She appears well-developed and well-nourished. She does not appear ill. No distress.  HENT:  Head: Atraumatic.  Eyes: Conjunctivae are normal.  Respiratory: No respiratory distress.  Musculoskeletal: She exhibits no edema.  Neurological: She is alert and oriented to person, place, and time. Coordination and gait normal.  Skin: Skin is warm. There is erythema.     3 cm induration on upper aspect of left buttocks, punctuate wound in meddle with yellowish drainage when applying pressure, minimal amount drained, no fluctuant area appreciated.  Psychiatric: She has a normal mood and affect.  Well groomed, good eye contact.      ASSESSMENT AND  PLAN:     Megan [name] was seen today for follow-up.  Diagnoses and all orders for this visit:  Abscess of buttock, left  Cellulitis of buttock -     sulfamethoxazole-trimethoprim (BACTRIM DS,SEPTRA DS) 800-160 MG tablet; Take 1 tablet by mouth 2 (two) times daily.   Reporting great improvement. Wound Cx final is still pending. Recommend stopping Keflex, complete 7-8 days of Bactrim. Continue warm compresses on area. Clearly instructed about warning signs. We will continue following culture, I think at this point she can follow up as needed.        -She was advised to return or notify a doctor immediately if symptoms worsen or persist or new concerns arise, she voices understanding.       Megan G. Martin[place], MD  [facility]. [place] office.

## 2016-06-05 LAB — WOUND CULTURE
GRAM STAIN: NONE SEEN
Gram Stain: NONE SEEN
Gram Stain: NONE SEEN

## 2016-06-05 MED FILL — SULFAMETHOXAZOLE/TMP DS TAB: 800-160 | 3 days supply | Qty: 6 | Fill #0

## 2016-06-08 ENCOUNTER — Ambulatory Visit: Payer: Self-pay | Admitting: Family Medicine

## 2017-04-02 ENCOUNTER — Other Ambulatory Visit: Payer: Self-pay | Admitting: Obstetrics and Gynecology

## 2017-04-02 DIAGNOSIS — Z1231 Encounter for screening mammogram for malignant neoplasm of breast: Secondary | ICD-10-CM

## 2017-04-16 DIAGNOSIS — Z30433 Encounter for removal and reinsertion of intrauterine contraceptive device: Secondary | ICD-10-CM | POA: Diagnosis not present

## 2017-04-27 ENCOUNTER — Ambulatory Visit
Admission: RE | Admit: 2017-04-27 | Discharge: 2017-04-27 | Disposition: A | Discharge status: Home / Self Care | Payer: 59 | Source: Ambulatory Visit | Attending: Obstetrics and Gynecology | Admitting: Obstetrics and Gynecology

## 2017-04-27 DIAGNOSIS — Z1231 Encounter for screening mammogram for malignant neoplasm of breast: Secondary | ICD-10-CM

## 2017-05-21 DIAGNOSIS — Z01419 Encounter for gynecological examination (general) (routine) without abnormal findings: Secondary | ICD-10-CM | POA: Diagnosis not present

## 2017-05-21 DIAGNOSIS — Z6827 Body mass index (BMI) 27.0-27.9, adult: Secondary | ICD-10-CM | POA: Diagnosis not present

## 2017-09-02 ENCOUNTER — Encounter: Payer: Self-pay | Admitting: Family Medicine

## 2017-12-23 ENCOUNTER — Encounter: Payer: Self-pay | Admitting: Family Medicine

## 2018-01-19 DIAGNOSIS — H5213 Myopia, bilateral: Secondary | ICD-10-CM | POA: Diagnosis not present

## 2018-05-25 NOTE — Progress Notes (Signed)
HPI:  Using dictation device. Unfortunately this device frequently misinterprets words/phrases.  Was supposed to have physical but has issues to address so changed to regular visit.  GERD/Bloating: -x several months at least -has bloating/gerd frequently - taking tums and zantac but not helping -so bad that at least once per month she will throw up at night -some intermittent loose stools/constipation -no fevers, focal abd pain, hematochezia, melena, wt loss (reports wt gain) -feels eating bread makes it worse, has been avoiding -father has chron's   ROS: See pertinent positives and negatives per HPI.  Past Medical History:  Diagnosis Date  . Allergic rhinitis   . TB lung, latent    +  quantiferon, neg PPD - s/p treatment - completed    Past Surgical History:  Procedure Laterality Date  . BREAST BIOPSY Right 08/24/2012   benign  . CESAREAN SECTION    . DILATION AND CURETTAGE OF UTERUS    . WISDOM TOOTH EXTRACTION      Family History  Problem Relation Age of Onset  . Cancer Mother        breast  . Hypertension Mother   . Hyperlipidemia Father   . Diabetes Father   . Stroke Maternal Grandmother     SOCIAL HX: see hpi   Current Outpatient Medications:  .  Calcium Carbonate Antacid (TUMS PO), Take by mouth as needed., Disp: , Rfl:  .  fluticasone (FLONASE) 50 MCG/ACT nasal spray, Place 2 sprays into both nostrils daily., Disp: 16 g, Rfl: 6 .  levonorgestrel (MIRENA) 20 MCG/24HR IUD, 1 each by Intrauterine route once., Disp: , Rfl:  .  Multiple Vitamin (MULTIVITAMIN) capsule, Take 1 capsule by mouth daily., Disp: , Rfl:  .  OVER THE COUNTER MEDICATION, Omega 3, Disp: , Rfl:  .  raNITIdine HCl (ZANTAC PO), Take by mouth as needed., Disp: , Rfl:  .  esomeprazole (NEXIUM) 40 MG capsule, Take 1 capsule (40 mg total) by mouth daily., Disp: 14 capsule, Rfl: 0  EXAM:  Vitals:   05/26/18 1650  BP: 110/70  Pulse: 73  Temp: 98.7 F (37.1 C)    Body mass index is  29.41 kg/m.  GENERAL: vitals reviewed and listed above, alert, oriented, appears well hydrated and in no acute distress  HEENT: atraumatic, conjunttiva clear, no obvious abnormalities on inspection of external nose and ears  NECK: no obvious masses on inspection  LUNGS: clear to auscultation bilaterally, no wheezes, rales or rhonchi, good air movement  CV: HRRR, no peripheral edema  ABD: BS+, soft, NTTP, no rebound or guarding  MS: moves all extremities without noticeable abnormality  PSYCH: pleasant and cooperative, no obvious depression or anxiety  ASSESSMENT AND PLAN:  Discussed the following assessment and plan:  Gastroesophageal reflux disease, esophagitis presence not specified  Screening for depression  Bloating - Plan: Hemoglobin A1c, C-reactive Protein  Vomiting, intractability of vomiting not specified, presence of nausea not specified, unspecified vomiting type - Plan: CBC, Comprehensive metabolic panel, TSH, Helicobacter pylori special antigen  Screening cholesterol level - Plan: Cholesterol, total, HDL cholesterol  -we discussed possible serious and likely etiologies, workup and treatment, treatment risks and return precautions -after this discussion, Megan [name] opted for labs per orders, trial nexium, already trying lifestyle changes for GERD, OTC dose nexium after 2 wks prescriptions dose -wheat daily for 8 wks so can do celiac screen -GI eval if persistent symptoms or abnormalities on labs -follow up advised 1 month, sooner as needed -of course, we advised Megan [name]  to  return or notify a doctor immediately if symptoms worsen or persist or new concerns arise.  Patient Instructions  BEFORE YOU LEAVE: -lab -follow up or CPE in one month (CPE not done today)   Start the nexium and take once daily for 2 weeks then take 20mg  daily until our next visit in 1 month  Eat 1 serving of wheat per day  We have ordered labs or studies at this visit. It can take up to 1-2  weeks for results and processing. IF results require follow up or explanation, we will call you with instructions. Clinically stable results will be released to your [facility] If you have not heard from [place] or cannot find your results in [facility] in 2 weeks please contact our office at [phone].  If you are not yet signed up for [facility], please consider signing up.   Follow up sooner if any worsening or concerns.       Megan Kern, DO

## 2018-05-26 ENCOUNTER — Encounter: Payer: Self-pay | Admitting: Family Medicine

## 2018-05-26 ENCOUNTER — Ambulatory Visit (INDEPENDENT_AMBULATORY_CARE_PROVIDER_SITE_OTHER): Payer: 59 | Admitting: Family Medicine

## 2018-05-26 VITALS — BP 110/70 | HR 73 | Temp 98.7°F | Ht 65.25 in | Wt 178.1 lb

## 2018-05-26 DIAGNOSIS — Z1331 Encounter for screening for depression: Secondary | ICD-10-CM

## 2018-05-26 DIAGNOSIS — Z13220 Encounter for screening for lipoid disorders: Secondary | ICD-10-CM | POA: Diagnosis not present

## 2018-05-26 DIAGNOSIS — R1110 Vomiting, unspecified: Secondary | ICD-10-CM

## 2018-05-26 DIAGNOSIS — K219 Gastro-esophageal reflux disease without esophagitis: Secondary | ICD-10-CM

## 2018-05-26 DIAGNOSIS — R140 Abdominal distension (gaseous): Secondary | ICD-10-CM

## 2018-05-26 MED ORDER — ESOMEPRAZOLE MAGNESIUM 40 MG PO CPDR: 40.0000 mg | DELAYED_RELEASE_CAPSULE | Freq: Every day | ORAL | 0 refills | Status: DC

## 2018-05-26 MED FILL — ESOMEPRAZOLE MAG DR 40 MG C: 40 | 14 days supply | Qty: 14 | Fill #0

## 2018-05-26 NOTE — Patient Instructions (Addendum)
BEFORE YOU LEAVE: -lab -follow up or CPE in one month (CPE not done today)   Start the nexium and take once daily for 2 weeks then take 20mg  daily until our next visit in 1 month  Eat 1 serving of wheat per day  We have ordered labs or studies at this visit. It can take up to 1-2 weeks for results and processing. IF results require follow up or explanation, we will call you with instructions. Clinically stable results will be released to your [facility]. If you have not heard from [place] or cannot find your results in [facility] in 2 weeks please contact our office at [phone].  If you are not yet signed up for [facility], please consider signing up.   Follow up sooner if any worsening or concerns.

## 2018-05-27 LAB — C-REACTIVE PROTEIN: CRP: 0.1 mg/dL — ABNORMAL LOW (ref 0.5–20.0)

## 2018-05-27 LAB — CBC
HCT: 41.8 % (ref 36.0–46.0)
HEMOGLOBIN: 14.5 g/dL (ref 12.0–15.0)
MCHC: 34.6 g/dL (ref 30.0–36.0)
MCV: 92.5 fl (ref 78.0–100.0)
Platelets: 295 10*3/uL (ref 150.0–400.0)
RBC: 4.53 Mil/uL (ref 3.87–5.11)
RDW: 13.1 % (ref 11.5–15.5)
WBC: 10.4 10*3/uL (ref 4.0–10.5)

## 2018-05-27 LAB — HELICOBACTER PYLORI  SPECIAL ANTIGEN
MICRO NUMBER:: 90710701
SPECIMEN QUALITY: ADEQUATE

## 2018-05-27 LAB — CHOLESTEROL, TOTAL: Cholesterol: 213 mg/dL — ABNORMAL HIGH (ref 0–200)

## 2018-05-27 LAB — COMPREHENSIVE METABOLIC PANEL WITH GFR
ALT: 15 U/L (ref 0–35)
AST: 14 U/L (ref 0–37)
Albumin: 4.9 g/dL (ref 3.5–5.2)
Alkaline Phosphatase: 80 U/L (ref 39–117)
BUN: 17 mg/dL (ref 6–23)
CO2: 27 meq/L (ref 19–32)
Calcium: 9.9 mg/dL (ref 8.4–10.5)
Chloride: 103 meq/L (ref 96–112)
Creatinine, Ser: 0.75 mg/dL (ref 0.40–1.20)
GFR: 88.93 mL/min
Glucose, Bld: 88 mg/dL (ref 70–99)
Potassium: 4 meq/L (ref 3.5–5.1)
Sodium: 139 meq/L (ref 135–145)
Total Bilirubin: 0.4 mg/dL (ref 0.2–1.2)
Total Protein: 7.7 g/dL (ref 6.0–8.3)

## 2018-05-27 LAB — HDL CHOLESTEROL: HDL: 45.4 mg/dL (ref >39.00)

## 2018-05-27 LAB — TSH: TSH: 2.55 u[IU]/mL (ref 0.35–4.50)

## 2018-05-27 LAB — HEMOGLOBIN A1C: Hgb A1c MFr Bld: 5.7 % (ref 4.6–6.5)

## 2018-06-26 ENCOUNTER — Encounter: Payer: Self-pay | Admitting: Family Medicine

## 2018-07-19 ENCOUNTER — Other Ambulatory Visit: Payer: Self-pay | Admitting: Obstetrics and Gynecology

## 2018-07-19 DIAGNOSIS — Z1231 Encounter for screening mammogram for malignant neoplasm of breast: Secondary | ICD-10-CM

## 2018-08-02 ENCOUNTER — Ambulatory Visit (INDEPENDENT_AMBULATORY_CARE_PROVIDER_SITE_OTHER): Payer: 59 | Admitting: Family Medicine

## 2018-08-02 ENCOUNTER — Encounter: Payer: Self-pay | Admitting: Family Medicine

## 2018-08-02 VITALS — BP 118/82 | HR 71 | Temp 98.4°F | Ht 65.0 in | Wt 178.4 lb

## 2018-08-02 DIAGNOSIS — R1084 Generalized abdominal pain: Secondary | ICD-10-CM

## 2018-08-02 DIAGNOSIS — Z0000 Encounter for general adult medical examination without abnormal findings: Secondary | ICD-10-CM | POA: Diagnosis not present

## 2018-08-02 DIAGNOSIS — Z1331 Encounter for screening for depression: Secondary | ICD-10-CM

## 2018-08-02 DIAGNOSIS — R195 Other fecal abnormalities: Secondary | ICD-10-CM | POA: Diagnosis not present

## 2018-08-02 NOTE — Patient Instructions (Signed)
BEFORE YOU LEAVE: -labs -follow up: yearly for physical and as needed pending labs  -We placed a referral for you as discussed to the gastroenterologist. It usually takes about 1-2 weeks to process and schedule this referral. If you have not heard from [place] regarding this appointment in 2 weeks please contact our office.   We recommend the following healthy lifestyle for LIFE: 1) Small portions. But, make sure to get regular (at least 3 per day), healthy meals and small healthy snacks if needed.  2) Eat a healthy clean diet.   TRY TO EAT: -at least 5-7 servings of low sugar, colorful, and nutrient rich vegetables per day (not corn, potatoes or bananas.) -berries are the best choice if you wish to eat fruit (only eat small amounts if trying to reduce weight)  -lean meets (fish, white meat of chicken or [place]) -vegan proteins for some meals - beans or tofu, whole grains, nuts and seeds -Replace bad fats with good fats - good fats include: fish, nuts and seeds, canola oil, olive oil -small amounts of low fat or non fat dairy -small amounts of100 % whole grains - check the lables -drink plenty of water  AVOID: -SUGAR, sweets, anything with added sugar, corn syrup or sweeteners - must read labels as even foods advertised as "healthy" often are loaded with sugar -if you must have a sweetener, small amounts of stevia may be best -sweetened beverages and artificially sweetened beverages -simple starches (rice, bread, potatoes, pasta, chips, etc - small amounts of 100% whole grains are ok) -red meat, pork, butter -fried foods, fast food, processed food, excessive dairy, eggs and coconut.  3)Get at least 150 minutes of sweaty aerobic exercise per week.  4)Reduce stress - consider counseling, meditation and relaxation to balance other aspects of your life.

## 2018-08-02 NOTE — Progress Notes (Signed)
HPI:  Using dictation device. Unfortunately this device frequently misinterprets words/phrases.  Here for CPE:  -Concerns and/or follow up today:  Chronic medical problems summarized below were reviewed for changes. She reports doing much much better on nexium. No further acid reflux of heartburn. She has continued to eat wheat to do the celiac testing and notices cramping and loose bowels every time she eats wheat. No fevers, malaise, wt loss, hematochezia, melena or rashes.  -Diet: variety of foods, balance and well rounded -Exercise: no regular exercise -Taking folic acid, vitamin D or calcium: no -Diabetes and Dyslipidemia Screening: done -Vaccines: see vaccine section Epic - plans to do flu shot at work -pap history: sees gyn, utd -FDLMP: see nursing notes -wants STI testing (Hep C if born [birth year]): no -FH breast, colon or ovarian ca: see FH Last mammogram: does with gyn Last colon cancer screening: n/a Breast Ca Risk Assessment: see family history and pt history DEXA (>/= 65): n/a  -Alcohol, Tobacco, drug use: see social history  Review of Systems - no fevers, unintentional weight loss, vision loss, hearing loss, chest pain, sob, hemoptysis, melena, hematochezia, hematuria, genital discharge, changing or concerning skin lesions, bleeding, bruising, loc, thoughts of self harm or SI  Past Medical History:  Diagnosis Date  . Allergic rhinitis   . TB lung, latent    +  quantiferon, neg PPD - s/p treatment - completed    Past Surgical History:  Procedure Laterality Date  . BREAST BIOPSY Right 08/24/2012   benign  . CESAREAN SECTION    . DILATION AND CURETTAGE OF UTERUS    . WISDOM TOOTH EXTRACTION      Family History  Problem Relation Age of Onset  . Cancer Mother        breast  . Hypertension Mother   . Hyperlipidemia Father   . Diabetes Father   . Stroke Maternal Grandmother     Social History   Socioeconomic History  . Marital status: Married    Spouse  name: Not on file  . Number of children: Not on file  . Years of education: Not on file  . Highest education level: Not on file  Occupational History  . Not on file  Social Needs  . Financial resource strain: Not on file  . Food insecurity:    Worry: Not on file    Inability: Not on file  . Transportation needs:    Medical: Not on file    Non-medical: Not on file  Tobacco Use  . Smoking status: Never Smoker  . Smokeless tobacco: Never Used  Substance and Sexual Activity  . Alcohol use: Yes    Comment: twice a year  . Drug use: No  . Sexual activity: Not on file  Lifestyle  . Physical activity:    Days per week: Not on file    Minutes per session: Not on file  . Stress: Not on file  Relationships  . Social connections:    Talks on phone: Not on file    Gets together: Not on file    Attends religious service: Not on file    Active member of club or organization: Not on file    Attends meetings of clubs or organizations: Not on file    Relationship status: Not on file  Other Topics Concern  . Not on file  Social History Narrative   Occupation: Therapist, sports - [employer]   Married - 2 kids (45 and 8 in 2016)   Regular exercise-  yes   Eating a healthy diet   Spiritual: catholic     Current Outpatient Medications:  .  esomeprazole (NEXIUM) 40 MG capsule, Take 1 capsule (40 mg total) by mouth daily., Disp: 14 capsule, Rfl: 0 .  fluticasone (FLONASE) 50 MCG/ACT nasal spray, Place 2 sprays into both nostrils daily., Disp: 16 g, Rfl: 6 .  levonorgestrel (MIRENA) 20 MCG/24HR IUD, 1 each by Intrauterine route once., Disp: , Rfl:   EXAM:  Vitals:   08/02/18 1631  BP: 118/82  Pulse: 71  Temp: 98.4 F (36.9 C)    GENERAL: vitals reviewed and listed below, alert, oriented, appears well hydrated and in no acute distress  HEENT: head atraumatic, PERRLA, normal appearance of eyes, ears, nose and mouth. moist mucus membranes.  NECK: supple, no masses or  lymphadenopathy  LUNGS: clear to auscultation bilaterally, no rales, rhonchi or wheeze  CV: HRRR, no peripheral edema or cyanosis, normal pedal pulses  ABDOMEN: bowel sounds normal, soft, non tender to palpation, no masses, no rebound or guarding  GU/BREAST: declined, sees gym  SKIN: no rash or abnormal lesions  MS: normal gait, moves all extremities normally  NEURO: normal gait, speech and thought processing grossly intact, muscle tone grossly intact throughout  PSYCH: normal affect, pleasant and cooperative  ASSESSMENT AND PLAN:  Discussed the following assessment and plan:  PREVENTIVE EXAM: -Discussed and advised all [place] preventive services health task force level A and B recommendations for age, sex and risks. -Advised at least 150 minutes of exercise per week and a healthy diet with avoidance of (less then 1 serving per week) processed foods, white starches, red meat, fast foods and sweets and consisting of: * 5-9 servings of fresh fruits and vegetables (not corn or potatoes) *nuts and seeds, beans *olives and olive oil *lean meats such as fish and white chicken  *whole grains -labs, studies and vaccines per orders this encounter   2. Screening for depression -see epic  3. Generalized abdominal pain 4. Loose stools - Celiac Disease Comprehensive Panel with Reflexes -refer gi   Patient advised to return to clinic immediately if symptoms worsen or persist or new concerns.  Patient Instructions  BEFORE YOU LEAVE: -labs -follow up: yearly for physical and as needed pending labs  -We placed a referral for you as discussed to the gastroenterologist. It usually takes about 1-2 weeks to process and schedule this referral. If you have not heard from [place] regarding this appointment in 2 weeks please contact our office.   We recommend the following healthy lifestyle for LIFE: 1) Small portions. But, make sure to get regular (at least 3 per day), healthy meals and small  healthy snacks if needed.  2) Eat a healthy clean diet.   TRY TO EAT: -at least 5-7 servings of low sugar, colorful, and nutrient rich vegetables per day (not corn, potatoes or bananas.) -berries are the best choice if you wish to eat fruit (only eat small amounts if trying to reduce weight)  -lean meets (fish, white meat of chicken or [place]) -vegan proteins for some meals - beans or tofu, whole grains, nuts and seeds -Replace bad fats with good fats - good fats include: fish, nuts and seeds, canola oil, olive oil -small amounts of low fat or non fat dairy -small amounts of100 % whole grains - check the lables -drink plenty of water  AVOID: -SUGAR, sweets, anything with added sugar, corn syrup or sweeteners - must read labels as even foods advertised as "healthy"  often are loaded with sugar -if you must have a sweetener, small amounts of stevia may be best -sweetened beverages and artificially sweetened beverages -simple starches (rice, bread, potatoes, pasta, chips, etc - small amounts of 100% whole grains are ok) -red meat, pork, butter -fried foods, fast food, processed food, excessive dairy, eggs and coconut.  3)Get at least 150 minutes of sweaty aerobic exercise per week.  4)Reduce stress - consider counseling, meditation and relaxation to balance other aspects of your life.    No follow-ups on file.  Lucretia Kern, DO

## 2018-08-03 LAB — CELIAC DISEASE COMPREHENSIVE PANEL WITH REFLEXES
(tTG) Ab, IgA: 1 U/mL
Immunoglobulin A: 191 mg/dL (ref 47–310)

## 2018-08-04 ENCOUNTER — Encounter: Payer: Self-pay | Admitting: Family Medicine

## 2018-08-04 NOTE — Telephone Encounter (Signed)
Dr. Kim patient 

## 2018-08-22 ENCOUNTER — Ambulatory Visit
Admission: RE | Admit: 2018-08-22 | Discharge: 2018-08-22 | Disposition: A | Discharge status: Home / Self Care | Payer: 59 | Source: Ambulatory Visit | Attending: Obstetrics and Gynecology | Admitting: Obstetrics and Gynecology

## 2018-08-22 DIAGNOSIS — Z1231 Encounter for screening mammogram for malignant neoplasm of breast: Secondary | ICD-10-CM | POA: Diagnosis not present

## 2018-09-12 ENCOUNTER — Encounter: Payer: Self-pay | Admitting: Gastroenterology

## 2018-10-14 ENCOUNTER — Encounter: Payer: Self-pay | Admitting: Gastroenterology

## 2018-10-14 ENCOUNTER — Other Ambulatory Visit: Payer: 59

## 2018-10-14 ENCOUNTER — Ambulatory Visit (INDEPENDENT_AMBULATORY_CARE_PROVIDER_SITE_OTHER): Payer: 59 | Admitting: Gastroenterology

## 2018-10-14 ENCOUNTER — Other Ambulatory Visit (INDEPENDENT_AMBULATORY_CARE_PROVIDER_SITE_OTHER): Payer: 59

## 2018-10-14 VITALS — BP 110/80 | HR 80 | Ht 65.75 in | Wt 173.2 lb

## 2018-10-14 DIAGNOSIS — R195 Other fecal abnormalities: Secondary | ICD-10-CM

## 2018-10-14 DIAGNOSIS — R1084 Generalized abdominal pain: Secondary | ICD-10-CM

## 2018-10-14 LAB — C-REACTIVE PROTEIN: CRP: 0.2 mg/dL — ABNORMAL LOW (ref 0.5–20.0)

## 2018-10-14 LAB — SEDIMENTATION RATE: Sed Rate: 12 mm/hr (ref 0–20)

## 2018-10-14 MED ORDER — ESOMEPRAZOLE MAGNESIUM 40 MG PO CPDR: 40.0000 mg | DELAYED_RELEASE_CAPSULE | Freq: Every day | ORAL | 0 refills | Status: DC

## 2018-10-14 MED FILL — ESOMEPRAZOLE MAG DR 40 MG C: 40 | 14 days supply | Qty: 14 | Fill #0

## 2018-10-14 NOTE — Progress Notes (Signed)
Referring Provider: Lucretia Kern, DO Primary Care Physician:  Lucretia Kern, DO   Reason for Consultation: Generalized abdominal pain   IMPRESSION:  Generalized abdominal pain and heartburn x 1 year Loose stools Father with Crohns  Differential is broad and includes irritable bowel syndrome, IBD, chronic infection (such as giardia), food intolerance, microscopic colitis,  other functional GI disease.     PLAN: - Trial of proobiotics - Continue Nexium 40 mg daily  - Stool and food diary - Fecal calprotectin, ESR, and CRP to screen for IBD - Giardia testing - EGD with biopsies and colonoscopy with biopsies - do not reduce gluten from the diet for 2 weeks prior to the endoscopy  - low threshold to evauate for SBBO and/or trial FODMAP diet if evaluation above is negative  I consented the patient at the bedside today discussing the risks, benefits, and alternatives to endoscopic evaluation. In particular, we discussed the risks that include, but are not limited to, reaction to medication, cardiopulmonary compromise, bleeding requiring blood transfusion, aspiration resulting in pneumonia, perforation requiring surgery, and even death. We reviewed the risk of missed lesion including polyps or even cancer. The patient acknowledges these risks and asks that we proceed.   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 Maudie Mercury for further evaluation of generalized abdominal pain and loose stools.  The history is obtained through the patient and review of her electronic health record.  Almost one year of severe heartburn. Twice a month nocturnal symptoms with the need to vomit. No speicific triggering foods. Tums bottle every 2 weeks. Dietary changes did not help. Ate a bisquit this summer, felt bloated and 9 months pregnant. Stopped eating bread after that episode with some improvement.  Short of breath. Triggered appointment with Dr. Maudie Mercury.  Nexium relieved heartburn and nocturnal vomiting. Tested for celiac on a wheat heavy diet.   Diffuse, non-radiating, lower abdominal cramping and loose stools. Pasty, brown. 2-3 BM within a couple hours of eating. Worse with meals that contain wheat. No other associated symptoms. No identified exacerbating or relieving features.   Recent labs obtained through her PCP include a H. pylori antigen stool study that was negative, C-reactive protein that was low, and negative celiac disease profile including TTGA 1nd IgA. TSH 2.55.  There is been no recent abdominal imaging.  No prior endoscopy.   Father with mild crohns. He is on azathioprine.   Past Medical History:  Diagnosis Date  . Allergic rhinitis   . TB lung, latent    +  quantiferon, neg PPD - s/p treatment - completed    Past Surgical History:  Procedure Laterality Date  . BREAST BIOPSY Right 08/24/2012   benign  . CESAREAN SECTION    . DILATION AND CURETTAGE OF UTERUS    . WISDOM TOOTH EXTRACTION      Prior to Admission medications   Medication Sig Start Date End Date Taking? Authorizing Provider  esomeprazole (NEXIUM) 40 MG capsule Take 1 capsule (40 mg total) by mouth daily. 05/26/18   Lucretia Kern, DO  fluticasone (FLONASE) 50 MCG/ACT nasal spray Place 2 sprays into both nostrils daily. 12/13/15   Lucretia Kern, DO  levonorgestrel (MIRENA) 20 MCG/24HR IUD 1 each by Intrauterine route once.    [provider]    Current Outpatient Medications  Medication Sig Dispense Refill  . esomeprazole (NEXIUM) 40 MG capsule Take 1 capsule (40 mg  total) by mouth daily. 14 capsule 0  . fluticasone (FLONASE) 50 MCG/ACT nasal spray Place 2 sprays into both nostrils daily. 16 g 6  . levonorgestrel (MIRENA) 20 MCG/24HR IUD 1 each by Intrauterine route once.     No current facility-administered medications for this visit.     Allergies as of 10/14/2018  . (No Known Allergies)    Family History  Problem Relation Age of Onset    . Cancer Mother        breast  . Hypertension Mother   . Hyperlipidemia Father   . Diabetes Father   . Stroke Maternal Grandmother     Social History   Socioeconomic History  . Marital status: Married    Spouse name: Not on file  . Number of children: Not on file  . Years of education: Not on file  . Highest education level: Not on file  Occupational History  . Not on file  Social Needs  . Financial resource strain: Not on file  . Food insecurity:    Worry: Not on file    Inability: Not on file  . Transportation needs:    Medical: Not on file    Non-medical: Not on file  Tobacco Use  . Smoking status: Never Smoker  . Smokeless tobacco: Never Used  Substance and Sexual Activity  . Alcohol use: Yes    Comment: twice a year  . Drug use: No  . Sexual activity: Not on file  Lifestyle  . Physical activity:    Days per week: Not on file    Minutes per session: Not on file  . Stress: Not on file  Relationships  . Social connections:    Talks on phone: Not on file    Gets together: Not on file    Attends religious service: Not on file    Active member of club or organization: Not on file    Attends meetings of clubs or organizations: Not on file    Relationship status: Not on file  . Intimate partner violence:    Fear of current or ex partner: Not on file    Emotionally abused: Not on file    Physically abused: Not on file    Forced sexual activity: Not on file  Other Topics Concern  . 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   Physical Exam: Vital signs were reviewed. General:   Alert, well-nourished, pleasant and cooperative in NAD Head:  Normocephalic and atraumatic. Eyes:  Sclera clear, no icterus.   Conjunctiva pink. Mouth:  No deformity or  lesions.   Neck:  Supple; no thyromegaly. Lungs:  Clear throughout to auscultation.   No wheezes.  Heart:  Regular rate and rhythm; no murmurs Abdomen:  Soft, nontender, normal bowel sounds. No rebound or guarding. No hepatosplenomegaly Rectal:  Deferred  Msk:  Symmetrical without gross deformities. Extremities:  No gross deformities or edema. Neurologic:  Alert and  oriented x4;  grossly nonfocal Skin:  No rash or bruise. Psych:  Alert and cooperative. Normal mood and affect.   Addie Alonge L. Tarri Glenn Md, MPH [facility] 10/14/2018, 8:01 AM

## 2018-10-14 NOTE — Patient Instructions (Signed)
Continue Nexium 40 mg daily.   Keep a food and stool diary until your procedures.   Try a daily probiotic such as Align.   Your provider has requested that you go to the basement level for lab work before leaving today. Press "B" on the elevator. The lab is located at the first door on the left as you exit the elevator.  It has been recommended to you by your physician that you have a(n) endoscopy and colonoscopy completed. Per your request, we did not schedule the procedure(s) today. Please contact our office at [phone] should you decide to have the procedure completed.

## 2018-10-15 LAB — GIARDIA ANTIGEN
MICRO NUMBER:: 91317239
RESULT: NOT DETECTED
SPECIMEN QUALITY:: ADEQUATE

## 2018-10-16 ENCOUNTER — Encounter: Payer: Self-pay | Admitting: Gastroenterology

## 2018-10-18 LAB — CALPROTECTIN, FECAL: Calprotectin, Fecal: 106 ug/g (ref 0–120)

## 2018-11-17 ENCOUNTER — Encounter: Payer: Self-pay | Admitting: Gastroenterology

## 2018-11-17 ENCOUNTER — Ambulatory Visit (AMBULATORY_SURGERY_CENTER): Payer: Self-pay | Admitting: *Deleted

## 2018-11-17 ENCOUNTER — Other Ambulatory Visit: Payer: Self-pay

## 2018-11-17 VITALS — Ht 65.0 in | Wt 176.0 lb

## 2018-11-17 DIAGNOSIS — R195 Other fecal abnormalities: Secondary | ICD-10-CM

## 2018-11-17 DIAGNOSIS — R1084 Generalized abdominal pain: Secondary | ICD-10-CM

## 2018-11-17 MED ORDER — SUPREP BOWEL PREP KIT 17.5-3.13-1.6 GM/177ML PO SOLN: 1.0000 | Freq: Once | ORAL | 0 refills | Status: AC

## 2018-11-17 MED FILL — SUPREP BOWEL PREP KIT: 17.5-3.13-1 | 1 days supply | Qty: 354 | Fill #0

## 2018-11-17 NOTE — Progress Notes (Signed)
Patient denies any allergies to egg or soy products. Patient denies complications with anesthesia/sedation.  Patient denies oxygen use at home and denies diet medications.  Pamphlet given on colonoscopy and endoscopy.

## 2018-11-28 DIAGNOSIS — Z6828 Body mass index (BMI) 28.0-28.9, adult: Secondary | ICD-10-CM | POA: Diagnosis not present

## 2018-11-28 DIAGNOSIS — Z8042 Family history of malignant neoplasm of prostate: Secondary | ICD-10-CM | POA: Diagnosis not present

## 2018-11-28 DIAGNOSIS — Z803 Family history of malignant neoplasm of breast: Secondary | ICD-10-CM | POA: Diagnosis not present

## 2018-11-28 DIAGNOSIS — Z01419 Encounter for gynecological examination (general) (routine) without abnormal findings: Secondary | ICD-10-CM | POA: Diagnosis not present

## 2018-12-01 ENCOUNTER — Encounter: Payer: Self-pay | Admitting: Gastroenterology

## 2018-12-01 ENCOUNTER — Ambulatory Visit (AMBULATORY_SURGERY_CENTER): Payer: 59 | Admitting: Gastroenterology

## 2018-12-01 VITALS — BP 125/69 | HR 61 | Temp 97.3°F | Resp 14 | Ht 65.0 in | Wt 176.0 lb

## 2018-12-01 DIAGNOSIS — D124 Benign neoplasm of descending colon: Secondary | ICD-10-CM

## 2018-12-01 DIAGNOSIS — K635 Polyp of colon: Secondary | ICD-10-CM

## 2018-12-01 DIAGNOSIS — K2970 Gastritis, unspecified, without bleeding: Secondary | ICD-10-CM

## 2018-12-01 DIAGNOSIS — R197 Diarrhea, unspecified: Secondary | ICD-10-CM | POA: Diagnosis not present

## 2018-12-01 DIAGNOSIS — D123 Benign neoplasm of transverse colon: Secondary | ICD-10-CM

## 2018-12-01 DIAGNOSIS — K2900 Acute gastritis without bleeding: Secondary | ICD-10-CM

## 2018-12-01 DIAGNOSIS — K2950 Unspecified chronic gastritis without bleeding: Secondary | ICD-10-CM | POA: Diagnosis not present

## 2018-12-01 DIAGNOSIS — R195 Other fecal abnormalities: Secondary | ICD-10-CM

## 2018-12-01 DIAGNOSIS — R1084 Generalized abdominal pain: Secondary | ICD-10-CM

## 2018-12-01 MED ORDER — SODIUM CHLORIDE 0.9 % IV SOLN: 500.0000 mL | Freq: Once | INTRAVENOUS | Status: DC

## 2018-12-01 NOTE — Patient Instructions (Signed)
YOU HAD AN ENDOSCOPIC PROCEDURE TODAY AT [facility]:   Refer to the procedure report that was given to you for any specific questions about what was found during the examination.  If the procedure report does not answer your questions, please call your gastroenterologist to clarify.  If you requested that your care partner not be given the details of your procedure findings, then the procedure report has been included in a sealed envelope for you to review at your convenience later.  YOU SHOULD EXPECT: Some feelings of bloating in the abdomen. Passage of more gas than usual.  Walking can help get rid of the air that was put into your GI tract during the procedure and reduce the bloating. If you had a lower endoscopy (such as a colonoscopy or flexible sigmoidoscopy) you may notice spotting of blood in your stool or on the toilet paper. If you underwent a bowel prep for your procedure, you may not have a normal bowel movement for a few days.  Please Note:  You might notice some irritation and congestion in your nose or some drainage.  This is from the oxygen used during your procedure.  There is no need for concern and it should clear up in a day or so.  SYMPTOMS TO REPORT IMMEDIATELY:   Following lower endoscopy (colonoscopy or flexible sigmoidoscopy):  Excessive amounts of blood in the stool  Significant tenderness or worsening of abdominal pains  Swelling of the abdomen that is new, acute  Fever of 100F or higher   Following upper endoscopy (EGD)  Vomiting of blood or coffee ground material  New chest pain or pain under the shoulder blades  Painful or persistently difficult swallowing  New shortness of breath  Fever of 100F or higher  Black, tarry-looking stools  For urgent or emergent issues, a gastroenterologist can be reached at any hour by calling [phone].   DIET:  We do recommend a small meal at first, but then you may proceed to your regular diet.  Drink  plenty of fluids but you should avoid alcoholic beverages for 24 hours.  MEDICATIONS: Continue present medications.  Please see handouts given to you by your recovery nurse.  Follow up with Dr. Tarri Glenn in her office to review these results.  ACTIVITY:  You should plan to take it easy for the rest of today and you should NOT DRIVE or use heavy machinery until tomorrow (because of the sedation medicines used during the test).    FOLLOW UP: Our staff will call the number listed on your records the next business day following your procedure to check on you and address any questions or concerns that you may have regarding the information given to you following your procedure. If we do not reach you, we will leave a message.  However, if you are feeling well and you are not experiencing any problems, there is no need to return our call.  We will assume that you have returned to your regular daily activities without incident.  If any biopsies were taken you will be contacted by phone or by letter within the next 1-3 weeks.  Please call us at [phone] if you have not heard about the biopsies in 3 weeks.   Thank you for allowing [place] to provide for your healthcare needs today.   SIGNATURES/CONFIDENTIALITY: You and/or your care partner have signed paperwork which will be entered into your electronic medical record.  These signatures attest to the fact that that  the information above on your After Visit Summary has been reviewed and is understood.  Full responsibility of the confidentiality of this discharge information lies with you and/or your care-partner.

## 2018-12-01 NOTE — Progress Notes (Signed)
Called to room to assist during endoscopic procedure.  Patient ID and intended procedure confirmed with present staff. Received instructions for my participation in the procedure from the performing physician.  

## 2018-12-01 NOTE — Progress Notes (Signed)
PT taken to PACU. Monitors in place. VSS. Report given to RN. 

## 2018-12-01 NOTE — Op Note (Signed)
[place] Patient Name: Megan [name] Procedure Date: 12/01/2018 2:59 PM MRN: [id number] Endoscopist: Thornton Park MD, MD Age: 47 Referring MD:  Date of Birth: November 09, 1971 Gender: Female Account #: [id number] Procedure:                Upper GI endoscopy Indications:              Generalized abdominal pain Medicines:                See the Anesthesia note for documentation of the                            administered medications Procedure:                Pre-Anesthesia Assessment:                           - Prior to the procedure, a History and Physical                            was performed, and patient medications and                            allergies were reviewed. The patient's tolerance of                            previous anesthesia was also reviewed. The risks                            and benefits of the procedure and the sedation                            options and risks were discussed with the patient.                            All questions were answered, and informed consent                            was obtained. Prior Anticoagulants: The patient has                            taken no previous anticoagulant or antiplatelet                            agents. ASA Grade Assessment: II - A patient with                            mild systemic disease. After reviewing the risks                            and benefits, the patient was deemed in                            satisfactory condition to undergo the procedure.  After obtaining informed consent, the endoscope was                            passed under direct vision. Throughout the                            procedure, the patient's blood pressure, pulse, and                            oxygen saturations were monitored continuously. The                            Model GIF-HQ190 [id number]) scope was introduced                            through the mouth, and  advanced to the second part                            of duodenum. The upper GI endoscopy was                            accomplished without difficulty. The patient                            tolerated the procedure well. Scope In: Scope Out: Findings:                 The examined esophagus was normal.                           The entire examined stomach was normal. Biopsies                            were taken with a cold forceps for histology.                            Estimated blood loss was minimal.                           The examined duodenum was normal. Biopsies for                            histology were taken with a cold forceps for                            evaluation of celiac disease. Complications:            No immediate complications. Estimated Blood Loss:     Estimated blood loss was minimal. Impression:               - Normal esophagus.                           - Normal stomach. Biopsied.                           -  Normal examined duodenum. Biopsied.                           - No source for her abdominal pain was identified                            on this examination. Awaiting biopsy results. Recommendation:           - Await pathology results.                           - Proceed with colonoscopy as previously planned. Thornton Park MD, MD 12/01/2018 3:29:34 PM This report has been signed electronically.

## 2018-12-01 NOTE — Op Note (Signed)
[place] Patient Name: Megan [name] Procedure Date: 12/01/2018 2:58 PM MRN: [id number] Endoscopist: Thornton Park MD, MD Age: 47 Referring MD:  Date of Birth: Jun 17, 1971 Gender: Female Account #: [id number] Procedure:                Colonoscopy Indications:              Generalized abdominal pain. No known family history                            of colon cancer or polyps. Medicines:                See the Anesthesia note for documentation of the                            administered medications Procedure:                Pre-Anesthesia Assessment:                           - Prior to the procedure, a History and Physical                            was performed, and patient medications and                            allergies were reviewed. The patient's tolerance of                            previous anesthesia was also reviewed. The risks                            and benefits of the procedure and the sedation                            options and risks were discussed with the patient.                            All questions were answered, and informed consent                            was obtained. Prior Anticoagulants: The patient has                            taken no previous anticoagulant or antiplatelet                            agents. ASA Grade Assessment: II - A patient with                            mild systemic disease. After reviewing the risks                            and benefits, the patient was deemed in  satisfactory condition to undergo the procedure.                           After obtaining informed consent, the colonoscope                            was passed under direct vision. Throughout the                            procedure, the patient's blood pressure, pulse, and                            oxygen saturations were monitored continuously. The                            Colonoscope was introduced  through the anus and                            advanced to the the terminal ileum, with                            identification of the appendiceal orifice and IC                            valve. Scope In: 3:11:58 PM Scope Out: 3:25:03 PM Scope Withdrawal Time: 0 hours 11 minutes 29 seconds  Total Procedure Duration: 0 hours 13 minutes 5 seconds  Findings:                 The perianal and digital rectal examinations were                            normal.                           Three sessile polyps were found in the descending                            colon and transverse colon. The polyps were 2 to 3                            mm in size. These polyps were removed with a cold                            biopsy forceps. Resection and retrieval were                            complete.                           The colon (entire examined portion) appeared                            normal. Biopsies were taken with a cold forceps for  histology.                           The exam was otherwise without abnormality on                            direct and retroflexion views. Complications:            No immediate complications. Estimated Blood Loss:     Estimated blood loss: none. Impression:               - Three 2 to 3 mm polyps in the descending colon                            and in the transverse colon, removed with a cold                            biopsy forceps. Resected and retrieved.                           - The entire examined colon is normal. Biopsied.                           - The examination was otherwise normal on direct                            and retroflexion views.                           - No obvious source for her symptoms identified on                            this examination. No evidence for Crohn's disease. Recommendation:           - Discharge patient to home.                           - Resume regular diet today.                            - Await biopsy results.                           - Repeat colonoscopy in 5 years for surveillance if                            at least one or two polyps is adenomatous. Repeat                            colonoscopy in 3 years if all three polyps are                            adenomatous.                           - Follow-up in the office to review these results.  Thornton Park MD, MD 12/01/2018 3:36:19 PM This report has been signed electronically.

## 2018-12-02 ENCOUNTER — Telehealth: Payer: Self-pay

## 2018-12-02 NOTE — Telephone Encounter (Signed)
  Follow up Call-  Call back number 12/01/2018  Post procedure Call Back phone  # [phone]  Permission to leave phone message Yes  Some recent data might be hidden     Patient questions:   Do you have a fever, pain , or abdominal swelling? No. Pain Score  0 *  Have you tolerated food without any problems? Yes.    Have you been able to return to your normal activities? Yes.    Do you have any questions about your discharge instructions: Diet   No. Medications  No. Follow up visit  No.  Do you have questions or concerns about your Care? No.  Actions: * If pain score is 4 or above: No action needed, pain <4.

## 2018-12-15 ENCOUNTER — Encounter: Payer: Self-pay | Admitting: Gastroenterology

## 2019-11-16 ENCOUNTER — Other Ambulatory Visit: Payer: Self-pay

## 2019-11-17 ENCOUNTER — Ambulatory Visit: Payer: Self-pay | Admitting: *Deleted

## 2019-11-17 ENCOUNTER — Ambulatory Visit (INDEPENDENT_AMBULATORY_CARE_PROVIDER_SITE_OTHER): Payer: No Typology Code available for payment source | Admitting: Internal Medicine

## 2019-11-17 ENCOUNTER — Encounter: Payer: Self-pay | Admitting: Internal Medicine

## 2019-11-17 ENCOUNTER — Other Ambulatory Visit: Payer: Self-pay | Admitting: Internal Medicine

## 2019-11-17 VITALS — BP 120/64 | HR 69 | Temp 97.5°F | Wt 169.0 lb

## 2019-11-17 DIAGNOSIS — E785 Hyperlipidemia, unspecified: Secondary | ICD-10-CM | POA: Insufficient documentation

## 2019-11-17 DIAGNOSIS — E559 Vitamin D deficiency, unspecified: Secondary | ICD-10-CM

## 2019-11-17 DIAGNOSIS — Z0000 Encounter for general adult medical examination without abnormal findings: Secondary | ICD-10-CM | POA: Diagnosis not present

## 2019-11-17 DIAGNOSIS — K219 Gastro-esophageal reflux disease without esophagitis: Secondary | ICD-10-CM | POA: Diagnosis not present

## 2019-11-17 LAB — LIPID PANEL
Cholesterol: 200 mg/dL (ref 0–200)
HDL: 38.4 mg/dL — ABNORMAL LOW (ref >39.00)
LDL Cholesterol: 139 mg/dL — ABNORMAL HIGH (ref 0–99)
NonHDL: 161.99
Total CHOL/HDL Ratio: 5
Triglycerides: 113 mg/dL (ref 0.0–149.0)
VLDL: 22.6 mg/dL (ref 0.0–40.0)

## 2019-11-17 LAB — COMPREHENSIVE METABOLIC PANEL
ALT: 29 U/L (ref 0–35)
AST: 17 U/L (ref 0–37)
Albumin: 4.7 g/dL (ref 3.5–5.2)
Alkaline Phosphatase: 89 U/L (ref 39–117)
BUN: 11 mg/dL (ref 6–23)
CO2: 26 mEq/L (ref 19–32)
Calcium: 9.5 mg/dL (ref 8.4–10.5)
Chloride: 104 mEq/L (ref 96–112)
Creatinine, Ser: 0.67 mg/dL (ref 0.40–1.20)
GFR: 94.68 mL/min (ref >60.00)
Glucose, Bld: 105 mg/dL — ABNORMAL HIGH (ref 70–99)
Potassium: 4.4 mEq/L (ref 3.5–5.1)
Sodium: 141 mEq/L (ref 135–145)
Total Bilirubin: 0.9 mg/dL (ref 0.2–1.2)
Total Protein: 6.9 g/dL (ref 6.0–8.3)

## 2019-11-17 LAB — TSH: TSH: 1.81 u[IU]/mL (ref 0.35–4.50)

## 2019-11-17 LAB — CBC WITH DIFFERENTIAL/PLATELET
Basophils Absolute: 0.1 10*3/uL (ref 0.0–0.1)
Basophils Relative: 0.7 % (ref 0.0–3.0)
Eosinophils Absolute: 0.1 10*3/uL (ref 0.0–0.7)
Eosinophils Relative: 1 % (ref 0.0–5.0)
HCT: 42.2 % (ref 36.0–46.0)
Hemoglobin: 14 g/dL (ref 12.0–15.0)
Lymphocytes Relative: 24.4 % (ref 12.0–46.0)
Lymphs Abs: 2.1 10*3/uL (ref 0.7–4.0)
MCHC: 33.2 g/dL (ref 30.0–36.0)
MCV: 93.5 fl (ref 78.0–100.0)
Monocytes Absolute: 0.7 10*3/uL (ref 0.1–1.0)
Monocytes Relative: 8.5 % (ref 3.0–12.0)
Neutro Abs: 5.5 10*3/uL (ref 1.4–7.7)
Neutrophils Relative %: 65.4 % (ref 43.0–77.0)
Platelets: 285 10*3/uL (ref 150.0–400.0)
RBC: 4.51 Mil/uL (ref 3.87–5.11)
RDW: 13.2 % (ref 11.5–15.5)
WBC: 8.4 10*3/uL (ref 4.0–10.5)

## 2019-11-17 LAB — VITAMIN D 25 HYDROXY (VIT D DEFICIENCY, FRACTURES): VITD: 20.5 ng/mL — ABNORMAL LOW (ref 30.00–100.00)

## 2019-11-17 LAB — VITAMIN B12: Vitamin B-12: 314 pg/mL (ref 211–911)

## 2019-11-17 LAB — HEMOGLOBIN A1C: Hgb A1c MFr Bld: 5.5 % (ref 4.6–6.5)

## 2019-11-17 MED ORDER — VITAMIN D (ERGOCALCIFEROL) 1.25 MG (50000 UNIT) PO CAPS: 50000.0000 [IU] | ORAL_CAPSULE | ORAL | 0 refills | Status: AC

## 2019-11-17 MED FILL — VIT D2 1.25 MG (50,000 UNIT: 1.25 MG | 84 days supply | Qty: 12 | Fill #0

## 2019-11-17 NOTE — Progress Notes (Signed)
Established Patient Office Visit     This visit occurred during the SARS-CoV-2 public health emergency.  Safety protocols were in place, including screening questions prior to the visit, additional usage of staff PPE, and extensive cleaning of exam room while observing appropriate contact time as indicated for disinfecting solutions.    CC/Reason for Visit: Annual preventive exam  HPI: Megan [name] is a 48 y.o. female who is coming in today for the above mentioned reasons. Past Medical History is significant for: GERD that have been acting up until recently when she restarted taking her Nexium.  She had an EGD and colonoscopy back in December 2019 by Dr. Tarri Glenn due to a sensation of abdominal bloating and nausea.  She states these scopes were negative.  She has been taking Nexium again consistently for the past 4 weeks and has noticed significant improvement in her symptoms.  Other than this her history was significant for seasonal allergies.  She has routine eye and dental care, her immunizations are up-to-date, colonoscopy was in 2019 and she is a 5-year callback.  She is overdue for screening mammogram which is important given her mother was diagnosed with breast cancer in her 46s.  She works as a Contractor.  She has 2 boys 1 is a senior in high school and is 1/7 grader, no known drug allergies, past surgical history is only significant for C-section and 2 D&Cs.  She is a never smoker, does not drink alcohol.  Family history significant for a father with hyperlipidemia and Crohn's disease and a mother with hypertension and breast cancer at age 46.   Past Medical/Surgical History: Past Medical History:  Diagnosis Date  . Allergic rhinitis   . Allergy   . GERD (gastroesophageal reflux disease)   . SVD (spontaneous vaginal delivery)    x 1  . TB lung, latent    +  quantiferon, neg PPD - s/p treatment - completed    Past Surgical History:  Procedure  Laterality Date  . BREAST BIOPSY Right 08/24/2012   benign  . CESAREAN SECTION     x 1  . DILATION AND CURETTAGE OF UTERUS     x 2  . WISDOM TOOTH EXTRACTION      Social History:  reports that she has never smoked. She has never used smokeless tobacco. She reports current alcohol use. She reports that she does not use drugs.  Allergies: No Known Allergies  Family History:  Family History  Problem Relation Age of Onset  . Hypertension Mother   . Breast cancer Mother   . Hyperlipidemia Father   . Diabetes Father   . Crohn's disease Father   . Stroke Maternal Grandmother   . Diabetes Maternal Grandmother   . Colon cancer Neg Hx   . Rectal cancer Neg Hx   . Stomach cancer Neg Hx   . Esophageal cancer Neg Hx      Current Outpatient Medications:  .  cetirizine (ZYRTEC) 10 MG tablet, Take 1 mg by mouth daily., Disp: , Rfl:  .  esomeprazole (NEXIUM) 40 MG capsule, Take 1 capsule (40 mg total) by mouth daily., Disp: 14 capsule, Rfl: 0 .  fluticasone (FLONASE) 50 MCG/ACT nasal spray, Place 2 sprays into both nostrils daily., Disp: 16 g, Rfl: 6 .  levonorgestrel (MIRENA) 20 MCG/24HR IUD, 1 each by Intrauterine route once., Disp: , Rfl:  .  Probiotic Product (ALIGN PO), Take 1 capsule by mouth., Disp: , Rfl:  Current Facility-Administered Medications:  .  0.9 %  sodium chloride infusion, 500 mL, Intravenous, Once, [place], MD  Review of Systems:  Constitutional: Denies fever, chills, diaphoresis, appetite change and fatigue.  HEENT: Denies photophobia, eye pain, redness, hearing loss, ear pain, congestion, sore throat, rhinorrhea, sneezing, mouth sores, trouble swallowing, neck pain, neck stiffness and tinnitus.   Respiratory: Denies SOB, DOE, cough, chest tightness,  and wheezing.   Cardiovascular: Denies chest pain, palpitations and leg swelling.  Gastrointestinal: Denies diarrhea, constipation, blood in stool and abdominal distention.  Genitourinary: Denies dysuria,  urgency, frequency, hematuria, flank pain and difficulty urinating.  Endocrine: Denies: hot or cold intolerance, sweats, changes in hair or nails, polyuria, polydipsia. Musculoskeletal: Denies myalgias, back pain, joint swelling, arthralgias and gait problem.  Skin: Denies pallor, rash and wound.  Neurological: Denies dizziness, seizures, syncope, weakness, light-headedness, numbness and headaches.  Hematological: Denies adenopathy. Easy bruising, personal or family bleeding history  Psychiatric/Behavioral: Denies suicidal ideation, mood changes, confusion, nervousness, sleep disturbance and agitation    Physical Exam: Vitals:   11/17/19 0817  BP: 120/64  Pulse: 69  Temp: (!) 97.5 F (36.4 C)  TempSrc: Temporal  SpO2: 96%  Weight: 169 lb (76.7 kg)    Body mass index is 28.12 kg/m.   Constitutional: NAD, calm, comfortable Eyes: PERRL, lids and conjunctivae normal ENMT: Mucous membranes are moist.Tympanic membrane is pearly white, no erythema or bulging. Neck: normal, supple, no masses, no thyromegaly Respiratory: clear to auscultation bilaterally, no wheezing, no crackles. Normal respiratory effort. No accessory muscle use.  Cardiovascular: Regular rate and rhythm, no murmurs / rubs / gallops. No extremity edema. 2+ pedal pulses.  Abdomen: no tenderness, no masses palpated. No hepatosplenomegaly. Bowel sounds positive.  Musculoskeletal: no clubbing / cyanosis. No joint deformity upper and lower extremities. Good ROM, no contractures. Normal muscle tone.  Skin: no rashes, lesions, ulcers. No induration Neurologic: CN 2-12 grossly intact. Sensation intact, DTR normal. Strength 5/5 in all 4.  Psychiatric: Normal judgment and insight. Alert and oriented x 3. Normal mood.     Office Visit from 11/17/2019 in [facility] [place]  PHQ-9 Total Score  0       Impression and Plan:  Encounter for preventive health examination  -She has routine eye and dental care.  -Immunizations are up-to-date and age-appropriate. -Screening labs today. -Healthy lifestyle has been discussed in detail. -She had a colonoscopy in 2019 and is a 5-year callback. -She is overdue for screening mammogram, she will call breast center to schedule. -Her GYN [facility], she has routine follow-up with him for cervical cancer screening.  Gastroesophageal reflux disease without esophagitis -Symptoms are well controlled now that she is back on Nexium.     Patient Instructions  -Nice seeing you today!!  -Lab work today; will notify you once results are available.  -Schedule follow up in 1 year or sooner if needed.   Preventive Care 57-27 Years Old, Female Preventive care refers to visits with your health care provider and lifestyle choices that can promote health and wellness. This includes:  A yearly physical exam. This may also be called an annual well check.  Regular dental visits and eye exams.  Immunizations.  Screening for certain conditions.  Healthy lifestyle choices, such as eating a healthy diet, getting regular exercise, not using drugs or products that contain nicotine and tobacco, and limiting alcohol use. What can I expect for my preventive care visit? Physical exam Your health care provider  will check your:  Height and weight. This may be used to calculate body mass index (BMI), which tells if you are at a healthy weight.  Heart rate and blood pressure.  Skin for abnormal spots. Counseling Your health care provider may ask you questions about your:  Alcohol, tobacco, and drug use.  Emotional well-being.  Home and relationship well-being.  Sexual activity.  Eating habits.  Work and work Statistician.  Method of birth control.  Menstrual cycle.  Pregnancy history. What immunizations do I need?  Influenza (flu) vaccine  This is recommended every year. Tetanus, diphtheria, and pertussis (Tdap) vaccine   You may need a Td booster every 10 years. Varicella (chickenpox) vaccine  You may need this if you have not been vaccinated. Zoster (shingles) vaccine  You may need this after age 80. Measles, mumps, and rubella (MMR) vaccine  You may need at least one dose of MMR if you were born in 1957 or later. You may also need a second dose. Pneumococcal conjugate (PCV13) vaccine  You may need this if you have certain conditions and were not previously vaccinated. Pneumococcal polysaccharide (PPSV23) vaccine  You may need one or two doses if you smoke cigarettes or if you have certain conditions. Meningococcal conjugate (MenACWY) vaccine  You may need this if you have certain conditions. Hepatitis A vaccine  You may need this if you have certain conditions or if you travel or work in places where you may be exposed to hepatitis A. Hepatitis B vaccine  You may need this if you have certain conditions or if you travel or work in places where you may be exposed to hepatitis B. Haemophilus influenzae type b (Hib) vaccine  You may need this if you have certain conditions. Human papillomavirus (HPV) vaccine  If recommended by your health care provider, you may need three doses over 6 months. You may receive vaccines as individual doses or as more than one vaccine together in one shot (combination vaccines). Talk with your health care provider about the risks and benefits of combination vaccines. What tests do I need? Blood tests  Lipid and cholesterol levels. These may be checked every 5 years, or more frequently if you are over 18 years old.  Hepatitis C test.  Hepatitis B test. Screening  Lung cancer screening. You may have this screening every year starting at age 44 if you have a 30-pack-year history of smoking and currently smoke or have quit within the past 15 years.  Colorectal cancer screening. All adults should have this screening starting at age 11 and continuing until age 38.  Your health care provider may recommend screening at age 71 if you are at increased risk. You will have tests every 1-10 years, depending on your results and the type of screening test.  Diabetes screening. This is done by checking your blood sugar (glucose) after you have not eaten for a while (fasting). You may have this done every 1-3 years.  Mammogram. This may be done every 1-2 years. Talk with your health care provider about when you should start having regular mammograms. This may depend on whether you have a family history of breast cancer.  BRCA-related cancer screening. This may be done if you have a family history of breast, ovarian, tubal, or peritoneal cancers.  Pelvic exam and Pap test. This may be done every 3 years starting at age 62. Starting at age 42, this may be done every 5 years if you have a Pap test in  combination with an HPV test. Other tests  Sexually transmitted disease (STD) testing.  Bone density scan. This is done to screen for osteoporosis. You may have this scan if you are at high risk for osteoporosis. Follow these instructions at home: Eating and drinking  Eat a diet that includes fresh fruits and vegetables, whole grains, lean protein, and low-fat dairy.  Take vitamin and mineral supplements as recommended by your health care provider.  Do not drink alcohol if: ? Your health care provider tells you not to drink. ? You are pregnant, may be pregnant, or are planning to become pregnant.  If you drink alcohol: ? Limit how much you have to 0-1 drink a day. ? Be aware of how much alcohol is in your drink. In the [place] one drink equals one 12 oz bottle of beer (355 mL), one 5 oz glass of wine (148 mL), or one 1 oz glass of hard liquor (44 mL). Lifestyle  Take daily care of your teeth and gums.  Stay active. Exercise for at least 30 minutes on 5 or more days each week.  Do not use any products that contain nicotine or tobacco, such as cigarettes,  e-cigarettes, and chewing tobacco. If you need help quitting, ask your health care provider.  If you are sexually active, practice safe sex. Use a condom or other form of birth control (contraception) in order to prevent pregnancy and STIs (sexually transmitted infections).  If told by your health care provider, take low-dose aspirin daily starting at age 56. What's next?  Visit your health care provider once a year for a well check visit.  Ask your health care provider how often you should have your eyes and teeth checked.  Stay up to date on all vaccines. This information is not intended to replace advice given to you by your health care provider. Make sure you discuss any questions you have with your health care provider. Document Released: 12/27/2015 Document Revised: 08/11/2018 Document Reviewed: 08/11/2018 Elsevier Patient Education  2020 [place], MD [facility]

## 2019-11-17 NOTE — Telephone Encounter (Signed)
Message from Mathis Bud sent at 11/17/2019 2:15 PM EST  Summary: lab results   Patient is calling to get lab results. Office states NT can disclose. Patient would like detailed message if no answer  Call back [phone]          Pt called and given results. See result note.

## 2019-11-17 NOTE — Patient Instructions (Signed)
-Nice seeing you today!!  -Lab work today; will notify you once results are available.  -Schedule follow up in 1 year or sooner if needed.   Preventive Care 55-46 Years Old, Female Preventive care refers to visits with your health care provider and lifestyle choices that can promote health and wellness. This includes:  A yearly physical exam. This may also be called an annual well check.  Regular dental visits and eye exams.  Immunizations.  Screening for certain conditions.  Healthy lifestyle choices, such as eating a healthy diet, getting regular exercise, not using drugs or products that contain nicotine and tobacco, and limiting alcohol use. What can I expect for my preventive care visit? Physical exam Your health care provider will check your:  Height and weight. This may be used to calculate body mass index (BMI), which tells if you are at a healthy weight.  Heart rate and blood pressure.  Skin for abnormal spots. Counseling Your health care provider may ask you questions about your:  Alcohol, tobacco, and drug use.  Emotional well-being.  Home and relationship well-being.  Sexual activity.  Eating habits.  Work and work Statistician.  Method of birth control.  Menstrual cycle.  Pregnancy history. What immunizations do I need?  Influenza (flu) vaccine  This is recommended every year. Tetanus, diphtheria, and pertussis (Tdap) vaccine  You may need a Td booster every 10 years. Varicella (chickenpox) vaccine  You may need this if you have not been vaccinated. Zoster (shingles) vaccine  You may need this after age 60. Measles, mumps, and rubella (MMR) vaccine  You may need at least one dose of MMR if you were born in 1957 or later. You may also need a second dose. Pneumococcal conjugate (PCV13) vaccine  You may need this if you have certain conditions and were not previously vaccinated. Pneumococcal polysaccharide (PPSV23) vaccine  You may need  one or two doses if you smoke cigarettes or if you have certain conditions. Meningococcal conjugate (MenACWY) vaccine  You may need this if you have certain conditions. Hepatitis A vaccine  You may need this if you have certain conditions or if you travel or work in places where you may be exposed to hepatitis A. Hepatitis B vaccine  You may need this if you have certain conditions or if you travel or work in places where you may be exposed to hepatitis B. Haemophilus influenzae type b (Hib) vaccine  You may need this if you have certain conditions. Human papillomavirus (HPV) vaccine  If recommended by your health care provider, you may need three doses over 6 months. You may receive vaccines as individual doses or as more than one vaccine together in one shot (combination vaccines). Talk with your health care provider about the risks and benefits of combination vaccines. What tests do I need? Blood tests  Lipid and cholesterol levels. These may be checked every 5 years, or more frequently if you are over 46 years old.  Hepatitis C test.  Hepatitis B test. Screening  Lung cancer screening. You may have this screening every year starting at age 46 if you have a 30-pack-year history of smoking and currently smoke or have quit within the past 15 years.  Colorectal cancer screening. All adults should have this screening starting at age 46 and continuing until age 30. Your health care provider may recommend screening at age 46 if you are at increased risk. You will have tests every 1-10 years, depending on your results and the type  of screening test.  Diabetes screening. This is done by checking your blood sugar (glucose) after you have not eaten for a while (fasting). You may have this done every 1-3 years.  Mammogram. This may be done every 1-2 years. Talk with your health care provider about when you should start having regular mammograms. This may depend on whether you have a family  history of breast cancer.  BRCA-related cancer screening. This may be done if you have a family history of breast, ovarian, tubal, or peritoneal cancers.  Pelvic exam and Pap test. This may be done every 3 years starting at age 46. Starting at age 37, this may be done every 5 years if you have a Pap test in combination with an HPV test. Other tests  Sexually transmitted disease (STD) testing.  Bone density scan. This is done to screen for osteoporosis. You may have this scan if you are at high risk for osteoporosis. Follow these instructions at home: Eating and drinking  Eat a diet that includes fresh fruits and vegetables, whole grains, lean protein, and low-fat dairy.  Take vitamin and mineral supplements as recommended by your health care provider.  Do not drink alcohol if: ? Your health care provider tells you not to drink. ? You are pregnant, may be pregnant, or are planning to become pregnant.  If you drink alcohol: ? Limit how much you have to 0-1 drink a day. ? Be aware of how much alcohol is in your drink. In the [place] one drink equals one 12 oz bottle of beer (355 mL), one 5 oz glass of wine (148 mL), or one 1 oz glass of hard liquor (44 mL). Lifestyle  Take daily care of your teeth and gums.  Stay active. Exercise for at least 30 minutes on 5 or more days each week.  Do not use any products that contain nicotine or tobacco, such as cigarettes, e-cigarettes, and chewing tobacco. If you need help quitting, ask your health care provider.  If you are sexually active, practice safe sex. Use a condom or other form of birth control (contraception) in order to prevent pregnancy and STIs (sexually transmitted infections).  If told by your health care provider, take low-dose aspirin daily starting at age 26. What's next?  Visit your health care provider once a year for a well check visit.  Ask your health care provider how often you should have your eyes and teeth checked.   Stay up to date on all vaccines. This information is not intended to replace advice given to you by your health care provider. Make sure you discuss any questions you have with your health care provider. Document Released: 12/27/2015 Document Revised: 08/11/2018 Document Reviewed: 08/11/2018 Elsevier Patient Education  2020 [employer].

## 2020-01-29 ENCOUNTER — Other Ambulatory Visit: Payer: Self-pay | Admitting: Internal Medicine

## 2020-01-29 ENCOUNTER — Ambulatory Visit
Admission: RE | Admit: 2020-01-29 | Discharge: 2020-01-29 | Disposition: A | Discharge status: Home / Self Care | Payer: 59 | Source: Ambulatory Visit

## 2020-01-29 ENCOUNTER — Other Ambulatory Visit: Payer: Self-pay

## 2020-01-29 DIAGNOSIS — Z1231 Encounter for screening mammogram for malignant neoplasm of breast: Secondary | ICD-10-CM | POA: Diagnosis not present

## 2020-01-31 ENCOUNTER — Other Ambulatory Visit: Payer: Self-pay | Admitting: Internal Medicine

## 2020-01-31 DIAGNOSIS — R928 Other abnormal and inconclusive findings on diagnostic imaging of breast: Secondary | ICD-10-CM

## 2020-02-16 ENCOUNTER — Ambulatory Visit
Admission: RE | Admit: 2020-02-16 | Discharge: 2020-02-16 | Disposition: A | Discharge status: Home / Self Care | Payer: 59 | Source: Ambulatory Visit | Attending: Internal Medicine | Admitting: Internal Medicine

## 2020-02-16 ENCOUNTER — Other Ambulatory Visit: Payer: Self-pay | Admitting: Internal Medicine

## 2020-02-16 ENCOUNTER — Other Ambulatory Visit: Payer: Self-pay

## 2020-02-16 DIAGNOSIS — N6313 Unspecified lump in the right breast, lower outer quadrant: Secondary | ICD-10-CM | POA: Diagnosis not present

## 2020-02-16 DIAGNOSIS — N6002 Solitary cyst of left breast: Secondary | ICD-10-CM | POA: Diagnosis not present

## 2020-02-16 DIAGNOSIS — R928 Other abnormal and inconclusive findings on diagnostic imaging of breast: Secondary | ICD-10-CM

## 2020-02-16 DIAGNOSIS — N6320 Unspecified lump in the left breast, unspecified quadrant: Secondary | ICD-10-CM

## 2020-02-16 DIAGNOSIS — N6310 Unspecified lump in the right breast, unspecified quadrant: Secondary | ICD-10-CM

## 2020-02-19 ENCOUNTER — Other Ambulatory Visit: Payer: 59

## 2020-02-20 ENCOUNTER — Ambulatory Visit
Admission: RE | Admit: 2020-02-20 | Discharge: 2020-02-20 | Disposition: A | Discharge status: Home / Self Care | Payer: 59 | Source: Ambulatory Visit | Attending: Internal Medicine | Admitting: Internal Medicine

## 2020-02-20 ENCOUNTER — Other Ambulatory Visit: Payer: Self-pay | Admitting: Internal Medicine

## 2020-02-20 ENCOUNTER — Other Ambulatory Visit: Payer: Self-pay

## 2020-02-20 DIAGNOSIS — N6313 Unspecified lump in the right breast, lower outer quadrant: Secondary | ICD-10-CM | POA: Diagnosis not present

## 2020-02-20 DIAGNOSIS — N6082 Other benign mammary dysplasias of left breast: Secondary | ICD-10-CM | POA: Diagnosis not present

## 2020-02-20 DIAGNOSIS — N6321 Unspecified lump in the left breast, upper outer quadrant: Secondary | ICD-10-CM | POA: Diagnosis not present

## 2020-02-20 DIAGNOSIS — N6320 Unspecified lump in the left breast, unspecified quadrant: Secondary | ICD-10-CM

## 2020-02-20 DIAGNOSIS — N6310 Unspecified lump in the right breast, unspecified quadrant: Secondary | ICD-10-CM

## 2020-02-20 DIAGNOSIS — D0511 Intraductal carcinoma in situ of right breast: Secondary | ICD-10-CM | POA: Diagnosis not present

## 2020-02-27 ENCOUNTER — Other Ambulatory Visit: Payer: 59

## 2020-02-27 ENCOUNTER — Ambulatory Visit: Payer: Self-pay | Admitting: General Surgery

## 2020-02-27 DIAGNOSIS — D0511 Intraductal carcinoma in situ of right breast: Secondary | ICD-10-CM | POA: Diagnosis not present

## 2020-02-27 DIAGNOSIS — D242 Benign neoplasm of left breast: Secondary | ICD-10-CM | POA: Diagnosis not present

## 2020-02-28 ENCOUNTER — Encounter: Payer: Self-pay | Admitting: Adult Health

## 2020-02-28 DIAGNOSIS — C50411 Malignant neoplasm of upper-outer quadrant of right female breast: Secondary | ICD-10-CM | POA: Insufficient documentation

## 2020-02-28 DIAGNOSIS — Z170 Estrogen receptor positive status [ER+]: Secondary | ICD-10-CM | POA: Insufficient documentation

## 2020-02-29 ENCOUNTER — Other Ambulatory Visit: Payer: Self-pay | Admitting: General Surgery

## 2020-02-29 DIAGNOSIS — D0511 Intraductal carcinoma in situ of right breast: Secondary | ICD-10-CM

## 2020-03-01 ENCOUNTER — Telehealth: Payer: Self-pay | Admitting: Hematology and Oncology

## 2020-03-01 NOTE — Telephone Encounter (Signed)
Received a new pt referral from Dr. Marlou Starks for dx of breast cancer. Megan [name] has been cld and scheduled to see Dr. Lindi Adie on 3/24 at 1pm. Pt aware to arrive 15 minutes early.

## 2020-03-04 NOTE — Progress Notes (Signed)
Location of Breast Cancer: Right Breast DCIS   Histology per Pathology Report: 1. PROGNOSTIC INDICATORS Results: IMMUNOHISTOCHEMICAL AND MORPHOMETRIC ANALYSIS PERFORMED MANUALLY Estrogen Receptor: 95%, POSITIVE, STRONG STAINING INTENSITY Progesterone Receptor: 90%, POSITIVE, STRONG STAINING INTENSITY  1. Breast, right, needle core biopsy, 8:30 o'clock - DUCTAL CARCINOMA IN SITU - SEE COMMENT 2. Breast, left, needle core biopsy, 2 o'clock - PAPILLARY LESION WITH EXTENSIVE APOCRINE METAPLASIA - SEE COMMENT Receptor Status: ER(95% +), PR (90% +), Her2-neu (), Ki-()  Did patient present with symptoms (if so, please note symptoms) or was this found on screening mammography?: screening mammogram  Mammogram: 8 mm lesions in the lateral aspect of both breasts.  Left side was biopsied and came back as an ill-defined papillary lesion.  The right side came back as ductal carcinoma in situ that was ER/PR positive.  Past/Anticipated interventions by surgeon, if any: Dr. Marlou Starks 02/27/2020 -I have discussed with her in detail all the different options for treatment and at this point given her family history, young age, and ill defined lesion in the left breast she is favoring bilateral mastectomies with reconstruction. -She would be a candidate for sentinel node mapping on the right. -We will go ahead and make her referrals today to plastic surgery to talk about reconstruction. -I will also make a referral to medical and radiation oncology to discuss adjuvant therapy.   Past/Anticipated interventions by medical oncology, if any: Chemotherapy  Dr. Lindi Adie 03/06/2020 1 pm  Seeing Dr. Marla Roe on 3/29   Lymphedema issues, if any:  No  Pain issues, if any:  No  SAFETY ISSUES:  Prior radiation? No  Pacemaker/ICD? No  Possible current pregnancy? Has IUD  Is the patient on methotrexate? No  Current Complaints / other details:      Megan [name] M. Leonie Green, BSN

## 2020-03-05 ENCOUNTER — Other Ambulatory Visit: Payer: Self-pay

## 2020-03-05 ENCOUNTER — Encounter: Payer: Self-pay | Admitting: Radiation Oncology

## 2020-03-05 ENCOUNTER — Ambulatory Visit
Admission: RE | Admit: 2020-03-05 | Discharge: 2020-03-05 | Disposition: A | Discharge status: Home / Self Care | Payer: 59 | Source: Ambulatory Visit | Attending: Radiation Oncology | Admitting: Radiation Oncology

## 2020-03-05 VITALS — Ht 65.0 in | Wt 167.0 lb

## 2020-03-05 DIAGNOSIS — Z170 Estrogen receptor positive status [ER+]: Secondary | ICD-10-CM | POA: Diagnosis not present

## 2020-03-05 DIAGNOSIS — D242 Benign neoplasm of left breast: Secondary | ICD-10-CM | POA: Diagnosis not present

## 2020-03-05 DIAGNOSIS — D0511 Intraductal carcinoma in situ of right breast: Secondary | ICD-10-CM | POA: Diagnosis not present

## 2020-03-05 DIAGNOSIS — C50411 Malignant neoplasm of upper-outer quadrant of right female breast: Secondary | ICD-10-CM

## 2020-03-05 DIAGNOSIS — Z803 Family history of malignant neoplasm of breast: Secondary | ICD-10-CM | POA: Diagnosis not present

## 2020-03-05 NOTE — Progress Notes (Signed)
[facility] CONSULT NOTE  Patient Care Team: Isaac Bliss, Rayford Halsted, MD as PCP - General (Internal Medicine)  CHIEF COMPLAINTS/PURPOSE OF CONSULTATION:  Newly diagnosed breast cancer  HISTORY OF PRESENTING ILLNESS:  Megan [name] 49 y.o. female is here because of recent diagnosis of right breast ductal carcinoma in situ. Screening mammogram detected bilateral breast masses. Bilateral US on 02/16/20 showed a 0.8cm right breast mass at the 8:30 position, a 0.8cm mass at the 2 o'clock position, with no axillary adenopathy bilaterally. Biopsy on 02/20/20 showed intermediate grade DCIS in the right breast, ER+ 95%, PR+ 90%, and in the left breast, a papillary lesion with extensive apocrine metaplasia. She presents to the clinic today for initial evaluation and discussion of treatment options.  Patient has met with Dr. Marlou Starks who recommended bilateral lumpectomies.  She has been extremely stressed out and worries about recurrences and is contemplating on bilateral mastectomies.  She is very anxious because of her extensive family history of breast cancer.  I reviewed her records extensively and collaborated the history with the patient.  SUMMARY OF ONCOLOGIC HISTORY: Oncology History  Malignant neoplasm of upper-outer quadrant of right breast in female, estrogen receptor positive ([place])  02/20/2020 Cancer Staging   Staging form: Breast, AJCC 8th Edition - Clinical stage from 02/20/2020: Stage 0 (cTis (DCIS), cN0, cM0, ER+, PR+) - Signed by Gardenia Phlegm, NP on 02/28/2020   02/28/2020 Initial Diagnosis   Screening mammogram detected bilateral breast masses. Bilateral US showed a 0.8cm right breast mass, 8:30 position, a 0.8cm mass, 2 o'clock position, no axillary adenopathy bilaterally. Biopsy showed intermediate grade DCIS in the right breast, ER+ 95%, PR+ 90%, and in the left breast, a papillary lesion with extensive apocrine metaplasia.      MEDICAL HISTORY:  Past Medical  History:  Diagnosis Date  . Allergic rhinitis   . Allergy   . GERD (gastroesophageal reflux disease)   . SVD (spontaneous vaginal delivery)    x 1  . TB lung, latent    +  quantiferon, neg PPD - s/p treatment - completed    SURGICAL HISTORY: Past Surgical History:  Procedure Laterality Date  . BREAST BIOPSY Right 08/24/2012   benign  . CESAREAN SECTION     x 1  . DILATION AND CURETTAGE OF UTERUS     x 2  . WISDOM TOOTH EXTRACTION      SOCIAL HISTORY: Social History   Socioeconomic History  . Marital status: Married    Spouse name: Not on file  . Number of children: 2  . Years of education: Not on file  . Highest education level: Not on file  Occupational History  . Occupation: Nurse Tourist information centre manager  Tobacco Use  . Smoking status: Never Smoker  . Smokeless tobacco: Never Used  Substance and Sexual Activity  . Alcohol use: Not Currently    Comment: twice a year  . Drug use: No  . Sexual activity: Yes    Birth control/protection: I.U.D.    Comment: Mirena IUD-Inserted 2018  Other Topics Concern  . 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 Difficulty of Paying Living Expenses:   Food Insecurity:   . Worried About Charity fundraiser in  the Last Year:   . [place] in the Last Year:   Transportation Needs:   . Film/video editor (Medical):   Marland Kitchen Lack of Transportation (Non-Medical):   Physical Activity:   . Days of Exercise per Week:   . Minutes of Exercise per Session:   Stress:   . Feeling of Stress :   Social Connections:   . Frequency of Communication with Friends and Family:   . Frequency of Social Gatherings with Friends and Family:   . Attends Religious Services:   . Active Member of Clubs or Organizations:   . Attends Theatre manager Meetings:   Marland Kitchen Marital Status:   Intimate Partner Violence:   . Fear of Current or Ex-Partner:   . Emotionally Abused:   Marland Kitchen Physically Abused:   . Sexually Abused:     FAMILY HISTORY: Family History  Problem Relation Age of Onset  . Hypertension Mother   . Breast cancer Mother   . Hyperlipidemia Father   . Diabetes Father   . Crohn's disease Father   . Stroke Maternal Grandmother   . Diabetes Maternal Grandmother   . Colon cancer Neg Hx   . Rectal cancer Neg Hx   . Stomach cancer Neg Hx   . Esophageal cancer Neg Hx     ALLERGIES:  is allergic to no known allergies.  MEDICATIONS:  Current Outpatient Medications  Medication Sig Dispense Refill  . cetirizine (ZYRTEC) 10 MG tablet Take 1 mg by mouth daily.    Marland Kitchen esomeprazole (NEXIUM) 40 MG capsule Take 1 capsule (40 mg total) by mouth daily. 14 capsule 0  . fluticasone (FLONASE) 50 MCG/ACT nasal spray Place 2 sprays into both nostrils daily. 16 g 6  . levonorgestrel (MIRENA) 20 MCG/24HR IUD 1 each by Intrauterine route once.    . Probiotic Product (ALIGN PO) Take 1 capsule by mouth.     Current Facility-Administered Medications  Medication Dose Route Frequency Provider Last Rate Last Admin  . 0.9 %  sodium chloride infusion  500 mL Intravenous Once [place], MD        REVIEW OF SYSTEMS:   Constitutional: Denies fevers, chills or abnormal night sweats Eyes: Denies blurriness of vision, double vision or watery eyes Ears, nose, mouth, throat, and face: Denies mucositis or sore throat Respiratory: Denies cough, dyspnea or wheezes Cardiovascular: Denies palpitation, chest discomfort or lower extremity swelling Gastrointestinal:  Denies nausea, heartburn or change in bowel habits Skin: Denies abnormal skin rashes Lymphatics: Denies new lymphadenopathy or easy bruising Neurological:Denies numbness, tingling or new weaknesses Behavioral/Psych: Mood is stable, no new changes  Breast: Denies any palpable lumps  or discharge All other systems were reviewed with the patient and are negative.  PHYSICAL EXAMINATION: ECOG PERFORMANCE STATUS: 1 - Symptomatic but completely ambulatory  Vitals:   03/06/20 1308  BP: 132/90  Pulse: 66  Resp: 20  Temp: 97.8 F (36.6 C)  SpO2: 99%   Filed Weights   03/06/20 1308  Weight: 169 lb (76.7 kg)    GENERAL:alert, no distress and comfortable SKIN: skin color, texture, turgor are normal, no rashes or significant lesions EYES: normal, conjunctiva are pink and non-injected, sclera clear OROPHARYNX:no exudate, no erythema and lips, buccal mucosa, and tongue normal  NECK: supple, thyroid normal size, non-tender, without nodularity LYMPH:  no palpable lymphadenopathy in the cervical, axillary or inguinal LUNGS: clear to auscultation and percussion with normal breathing effort HEART: regular rate & rhythm and no murmurs and no lower  extremity edema ABDOMEN:abdomen soft, non-tender and normal bowel sounds Musculoskeletal:no cyanosis of digits and no clubbing  PSYCH: alert & oriented x 3 with fluent speech NEURO: no focal motor/sensory deficits BREAST: No palpable nodules in breast. No palpable axillary or supraclavicular lymphadenopathy (exam performed in the presence of a chaperone)   LABORATORY DATA:  I have reviewed the data as listed Lab Results  Component Value Date   WBC 8.4 11/17/2019   HGB 14.0 11/17/2019   HCT 42.2 11/17/2019   MCV 93.5 11/17/2019   PLT 285.0 11/17/2019   Lab Results  Component Value Date   NA 141 11/17/2019   K 4.4 11/17/2019   CL 104 11/17/2019   CO2 26 11/17/2019    RADIOGRAPHIC STUDIES: I have personally reviewed the radiological reports and agreed with the findings in the report.  ASSESSMENT AND PLAN:  Malignant neoplasm of upper-outer quadrant of right breast in female, estrogen receptor positive ([place]) 02/28/2020: Screening mammogram detected bilateral breast masses. Bilateral US on 02/16/20 showed a 0.8cm right  breast mass at the 8:30 position, a 0.8cm mass at the 2 o'clock position, with no axillary adenopathy bilaterally. Biopsy on 02/20/20 showed intermediate grade DCIS in the right breast, ER+ 95%, PR+ 90%, and in the left breast, a papillary lesion with extensive apocrine metaplasia.   Pathology review: I discussed with the patient the difference between DCIS and invasive breast cancer. It is considered a precancerous lesion. DCIS is classified as a 0. It is generally detected through mammograms as calcifications. We discussed the significance of grades and its impact on prognosis. We also discussed the importance of ER and PR receptors and their implications to adjuvant treatment options. Prognosis of DCIS dependence on grade, comedo necrosis. It is anticipated that if not treated, 20-30% of DCIS can develop into invasive breast cancer.  Recommendation: 1. Breast conserving surgery (patient is contemplating on bilateral mastectomies) 2. Followed by adjuvant radiation therapy 3. Followed by antiestrogen therapy with tamoxifen 5 years 4.  Genetic testing: We arrange for this today.  Tamoxifen counseling: We discussed the risks and benefits of tamoxifen. These include but not limited to insomnia, hot flashes, mood changes, vaginal dryness, and weight gain. Although rare, serious side effects including endometrial cancer, risk of blood clots were also discussed. We strongly believe that the benefits far outweigh the risks. Patient understands these risks and consented to starting treatment. Planned treatment duration is 5 years.  Apocrine metaplasia: I discussed with her that this is a benign finding and there is no clear-cut reasoning for this finding.  She is likely to have another lumpectomy on the side to remove the papillomatosis and apocrine metaplasia.  If patient undergoes bilateral mastectomies then she does not need either radiation or antiestrogen therapy.  I briefly discussed with her about  participating in [facility] clinical trial which randomized patients with low to intermediate grade DCIS that is hormone receptor positive to either surgery versus active surveillance with tamoxifen.  She informed me that she is not interested in the clinical trial.  Return to clinic after surgery to discuss the final pathology report and come up with an adjuvant treatment plan.  All questions were answered. The patient knows to call the clinic with any problems, questions or concerns.   Rulon Eisenmenger, MD, MPH 03/06/2020    I, Molly Dorshimer, am acting as scribe for Nicholas Lose, MD.  I have reviewed the above documentation for accuracy and completeness, and I agree with the above.

## 2020-03-06 ENCOUNTER — Encounter: Payer: Self-pay | Admitting: Licensed Clinical Social Worker

## 2020-03-06 ENCOUNTER — Ambulatory Visit: Payer: 59 | Admitting: Licensed Clinical Social Worker

## 2020-03-06 ENCOUNTER — Other Ambulatory Visit: Payer: Self-pay

## 2020-03-06 ENCOUNTER — Inpatient Hospital Stay: Payer: 59 | Attending: Hematology and Oncology | Admitting: Hematology and Oncology

## 2020-03-06 ENCOUNTER — Inpatient Hospital Stay: Payer: 59 | Admitting: Licensed Clinical Social Worker

## 2020-03-06 ENCOUNTER — Inpatient Hospital Stay: Payer: 59

## 2020-03-06 DIAGNOSIS — Z803 Family history of malignant neoplasm of breast: Secondary | ICD-10-CM | POA: Insufficient documentation

## 2020-03-06 DIAGNOSIS — D0511 Intraductal carcinoma in situ of right breast: Secondary | ICD-10-CM | POA: Diagnosis not present

## 2020-03-06 DIAGNOSIS — C50411 Malignant neoplasm of upper-outer quadrant of right female breast: Secondary | ICD-10-CM

## 2020-03-06 DIAGNOSIS — Z8042 Family history of malignant neoplasm of prostate: Secondary | ICD-10-CM | POA: Insufficient documentation

## 2020-03-06 DIAGNOSIS — Z170 Estrogen receptor positive status [ER+]: Secondary | ICD-10-CM | POA: Diagnosis not present

## 2020-03-06 DIAGNOSIS — Z800 Family history of malignant neoplasm of digestive organs: Secondary | ICD-10-CM

## 2020-03-06 NOTE — Progress Notes (Signed)
[place] Psychosocial Distress Screening Clinical Social Work  Clinical Social Work was referred by distress screening protocol.  The patient scored a 7 on the Psychosocial Distress Thermometer which indicates moderate distress. Clinical Social Worker met with patient in exam room"". to assess for distress and other psychosocial needs. Patient stressed but feeling better after meeting with radiation oncologist and medical oncologist. She has good support from her supervisor at work and is making accommodations to prepare for surgery. She has strong family support. Has told her two sons, ages 72 and 83.   CSW informed patient of the support team and support services at [facility].  CSW provided contact information and encouraged patient to call with any questions or concerns.      ONCBCN DISTRESS SCREENING 03/05/2020  Screening Type Initial Screening  Distress experienced in past week (1-10) 7  Other Contact via phone    Clinical Social Worker follow up needed: No.    Megan Janes E, LCSW

## 2020-03-06 NOTE — Progress Notes (Signed)
REFERRING PROVIDER: Nicholas Lose, MD [address] [place],  [place] [address]  PRIMARY PROVIDER:  Isaac Bliss, Rayford Halsted, MD  PRIMARY REASON FOR VISIT:  1. Family history of breast cancer   2. Family history of prostate cancer   3. Family history of colon cancer     I connected with Ms. Piekarski on 03/06/2020 at 2:00 PM EDT by Webex and verified that I am speaking with the correct person using two identifiers.    Patient location: [place] Provider location: [place]  HISTORY OF PRESENT ILLNESS:   Ms. Natal, a 49 y.o. female, was seen for a [place] cancer genetics consultation at the request of Dr. Lindi Adie due to a personal and family history of cancer.  Ms. Nass presents to clinic today to discuss the possibility of a hereditary predisposition to cancer, genetic testing, and to further clarify her future cancer risks, as well as potential cancer risks for family members.   In 2021, at the age of 72, Ms. Curiale was diagnosed with DCIS of the right breast, ER/PR+. The treatment plan includes breast conserving surgery, adjuvant radiation and adjuvant antiestrogen therapy. Marland Kitchen    CANCER HISTORY:  Oncology History  Malignant neoplasm of upper-outer quadrant of right breast in female, estrogen receptor positive ([place])  02/20/2020 Cancer Staging   Staging form: Breast, AJCC 8th Edition - Clinical stage from 02/20/2020: Stage 0 (cTis (DCIS), cN0, cM0, ER+, PR+) - Signed by Gardenia Phlegm, NP on 02/28/2020   02/28/2020 Initial Diagnosis   Screening mammogram detected bilateral breast masses. Bilateral US showed a 0.8cm right breast mass, 8:30 position, a 0.8cm mass, 2 o'clock position, no axillary adenopathy bilaterally. Biopsy showed intermediate grade DCIS in the right breast, ER+ 95%, PR+ 90%, and in the left breast, a papillary lesion with extensive apocrine metaplasia.      RISK FACTORS:  Menarche was at age 14.  First live birth at age 16.  OCP use:  yes Ovaries intact: yes.  Hysterectomy: no.  Menopausal status: premenopausal.  HRT use: 0 years. Colonoscopy: yes; normal. Mammogram within the last year: yes. Number of breast biopsies: 2. Up to date with pelvic exams: yes. Any excessive radiation exposure in the past: no  Past Medical History:  Diagnosis Date  . Allergic rhinitis   . Allergy   . Family history of breast cancer   . Family history of colon cancer   . Family history of prostate cancer   . GERD (gastroesophageal reflux disease)   . SVD (spontaneous vaginal delivery)    x 1  . TB lung, latent    +  quantiferon, neg PPD - s/p treatment - completed    Past Surgical History:  Procedure Laterality Date  . BREAST BIOPSY Right 08/24/2012   benign  . CESAREAN SECTION     x 1  . DILATION AND CURETTAGE OF UTERUS     x 2  . WISDOM TOOTH EXTRACTION      Social History   Socioeconomic History  . Marital status: Married    Spouse name: Not on file  . Number of children: 2  . Years of education: Not on file  . Highest education level: Not on file  Occupational History  . Occupation: Nurse Tourist information centre manager  Tobacco Use  . Smoking status: Never Smoker  . Smokeless tobacco: Never Used  Substance and Sexual Activity  . Alcohol use: Not Currently    Comment: twice a year  . Drug use: No  .  Sexual activity: Yes    Birth control/protection: I.U.D.    Comment: Mirena IUD-Inserted 2018  Other Topics Concern  . 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 Difficulty of Paying Living Expenses:   Food Insecurity:   . Worried About Charity fundraiser in the Last Year:   . Arboriculturist in the Last Year:   Transportation Needs:   . Film/video editor (Medical):   Marland Kitchen Lack of Transportation (Non-Medical):    Physical Activity:   . Days of Exercise per Week:   . Minutes of Exercise per Session:   Stress:   . Feeling of Stress :   Social Connections:   . Frequency of Communication with Friends and Family:   . Frequency of Social Gatherings with Friends and Family:   . Attends Religious Services:   . Active Member of Clubs or Organizations:   . Attends Archivist Meetings:   Marland Kitchen Marital Status:      FAMILY HISTORY:  We obtained a detailed, 4-generation family history.  Significant diagnoses are listed below: Family History  Problem Relation Age of Onset  . Hypertension Mother   . Breast cancer Mother 95  . Hyperlipidemia Father   . Diabetes Father   . Crohn's disease Father   . Stroke Maternal Grandmother   . Diabetes Maternal Grandmother   . Prostate cancer Paternal Uncle   . Colon cancer Paternal Grandmother   . Breast cancer Other   . Rectal cancer Neg Hx   . Stomach cancer Neg Hx   . Esophageal cancer Neg Hx    Ms. Rann has 2 sons. She has 1 brother, 71, with no cancer history.  Ms. Sabas mother was diagnosed with breast cancer at 40 and is living at 54. Patient has 1 full maternal uncle and a half maternal aunt, no cancers. No known cancers in maternal cousins. Maternal grandmother passed at 31. This grandmother had a sister who had breast cancer. Maternal grandfather passed at 31.  Ms. Borre father is living at 20. Patient had 5 paternal uncles and 4 paternal aunts. One uncle had prostate cancer. No known cancers in paternal cousins. Paternal grandmother had colon cancer in her 41s and died in her 41s. Paternal grandfather died due to a cardiac issue in his 17s.  Ms. Bolds is aware of previous family history of genetic testing for hereditary cancer risks. Patient's maternal ancestors are of [place] descent, and paternal ancestors are of English descent. There is no reported Ashkenazi Jewish ancestry. There is no known consanguinity.  GENETIC COUNSELING  ASSESSMENT: Ms. Salmons is a 49 y.o. female with a personal and family history which is somewhat suggestive of a hereditary cancer syndrome and predisposition to cancer. We, therefore, discussed and recommended the following at today's visit.   DISCUSSION: We discussed that 5 - 10% of breast cancer is hereditary, with most cases associated with BRCA1/BRCA2 mutations.  There are other genes that can be associated with hereditary breast cancer syndromes.  We discussed that testing is beneficial for several reasons including surgical decision-making for breast cancer, knowing how to follow individuals after completing their treatment, and understand if other family members could be at risk for cancer and allow them  to undergo genetic testing.   We reviewed the characteristics, features and inheritance patterns of hereditary cancer syndromes. We also discussed genetic testing, including the appropriate family members to test, the process of testing, insurance coverage and turn-around-time for results. We discussed the implications of a negative, positive and/or variant of uncertain significant result. We recommended Ms. Coppock pursue genetic testing for the [facility] Breast Cancer STAT + Common Hereditary Cancers gene panel.   The STAT Breast cancer panel offered by [facility] includes sequencing and rearrangement analysis for the following 9 genes:  ATM, BRCA1, BRCA2, CDH1, CHEK2, PALB2, PTEN, STK11 and TP53.    The Common Hereditary Cancers Panel offered by [facility] includes sequencing and/or deletion duplication testing of the following 48 genes: APC, ATM, AXIN2, BARD1, BMPR1A, BRCA1, BRCA2, BRIP1, CDH1, CDKN2A (p14ARF), CDKN2A (p16INK4a), CKD4, CHEK2, CTNNA1, DICER1, EPCAM (Deletion/duplication testing only), GREM1 (promoter region deletion/duplication testing only), KIT, MEN1, MLH1, MSH2, MSH3, MSH6, MUTYH, NBN, NF1, NHTL1, PALB2, PDGFRA, PMS2, POLD1, POLE, PTEN, RAD50, RAD51C, RAD51D, RNF43, SDHB, SDHC, SDHD,  SMAD4, SMARCA4. STK11, TP53, TSC1, TSC2, and VHL.  The following genes were evaluated for sequence changes only: SDHA and HOXB13 c.251G>A variant only.  Based on Ms. Dunlevy's personal and family history of cancer, she meets medical criteria for genetic testing. Despite that she meets criteria, she may still have an out of pocket cost.   Additionally, she may not need this testing should we find out her previous genetic testing was comprehensive. We will let Ms. Goltz know more once we obtain a copy of her prior testing from [facility].   PLAN: After considering the risks, benefits, and limitations, Ms. Hamill provided informed consent to pursue genetic testing and the blood sample was sent to [facility] for analysis of the Breast Cancer STAT Panel + Common Hereditary Cancers Panel.  Results should be available within approximately 1 weeks' time, at which point they will be disclosed by telephone to Ms. Dlugosz, as will any additional recommendations warranted by these results. Ms. Durney will receive a summary of her genetic counseling visit and a copy of her results once available. This information will also be available in Epic.  We will also reach out to [facility] to obtain a copy of her previous testing. Should this testing be comprehensive, we will cancel the above testing.   Ms. Winfree questions were answered to her satisfaction today. Our contact information was provided should additional questions or concerns arise. Thank you for the referral and allowing [place] to share in the care of your patient.   Faith Rogue, MS, [facility] Genetic Counselor [email or web address] Phone: [phone]  The patient was seen for a total of 15 minutes in face-to-face genetic counseling.  Drs. Magrinat, Lindi Adie and/or Burr Medico were available for discussion regarding this case.   _______________________________________________________________________ For Office Staff:  Number of  people involved in session: 1 Was an Intern/ student involved with case: no

## 2020-03-06 NOTE — Assessment & Plan Note (Addendum)
02/28/2020: Screening mammogram detected bilateral breast masses. Bilateral US on 02/16/20 showed a 0.8cm right breast mass at the 8:30 position, a 0.8cm mass at the 2 o'clock position, with no axillary adenopathy bilaterally. Biopsy on 02/20/20 showed intermediate grade DCIS in the right breast, ER+ 95%, PR+ 90%, and in the left breast, a papillary lesion with extensive apocrine metaplasia.   Pathology review: I discussed with the patient the difference between DCIS and invasive breast cancer. It is considered a precancerous lesion. DCIS is classified as a 0. It is generally detected through mammograms as calcifications. We discussed the significance of grades and its impact on prognosis. We also discussed the importance of ER and PR receptors and their implications to adjuvant treatment options. Prognosis of DCIS dependence on grade, comedo necrosis. It is anticipated that if not treated, 20-30% of DCIS can develop into invasive breast cancer.  Recommendation: 1. Breast conserving surgery 2. Followed by adjuvant radiation therapy 3. Followed by antiestrogen therapy with tamoxifen 5 years  Tamoxifen counseling: We discussed the risks and benefits of tamoxifen. These include but not limited to insomnia, hot flashes, mood changes, vaginal dryness, and weight gain. Although rare, serious side effects including endometrial cancer, risk of blood clots were also discussed. We strongly believe that the benefits far outweigh the risks. Patient understands these risks and consented to starting treatment. Planned treatment duration is 5 years.  Return to clinic after surgery to discuss the final pathology report and come up with an adjuvant treatment plan.

## 2020-03-07 ENCOUNTER — Telehealth: Payer: Self-pay | Admitting: Licensed Clinical Social Worker

## 2020-03-07 NOTE — Telephone Encounter (Signed)
Received copy of her 2019 genetic test results from [facility]. We revealed she did have the [facility] testing which included 36 genes, and this was negative. We discussed that we do not know why she has breast cancer or why there is cancer in the family. It could be due to a different gene that we are not testing, or something our current technology cannot pick up.  It will be important for her to keep in contact with genetics to learn if additional testing may be needed in the future. Patient would like to cancel the [facility] testing ordered yesterday since this testing from 2019 was comprehensive.

## 2020-03-07 NOTE — Progress Notes (Signed)
Radiation Oncology         [phone] ________________________________  Name: Megan [name]        MRN: [id number]  Date of Service: 03/05/2020 DOB: [birth year]  [id number] Everardo Beals, MD  Jovita Kussmaul, MD     REFERRING PHYSICIAN: Autumn Messing III, MD   DIAGNOSIS: The encounter diagnosis was Malignant neoplasm of upper-outer quadrant of right breast in female, estrogen receptor positive ([place]).   HISTORY OF PRESENT ILLNESS: Megan [name] is a 49 y.o. female with a new diagnosis of right breast cancer. The patient was noted to have a palpable lesion in the left breast and presented for diagnostic imaging. Workup revealed a 8 mm right breast mass at 8:30, and at 2:00 in the left breast there was an 8 mm lesion as well. Her axilla bilaterally was negative for adenopathy. She proceeded with biopsy on 02/20/20 and the right breast biopsy revealed an intermediate grade DCIS, that was ER/PR positive, and the left biopsy revealed a papillary lesion. She is planning an MRI on 03/15/20 to further evaluate her tissue, and is planning to meet tomorrow with Dr. Lindi Adie and with genetic testing. She is contemplating bilateral mastectomies.    PREVIOUS RADIATION THERAPY: No   PAST MEDICAL HISTORY:  Past Medical History:  Diagnosis Date  . Allergic rhinitis   . Allergy   . Family history of breast cancer   . Family history of colon cancer   . Family history of prostate cancer   . GERD (gastroesophageal reflux disease)   . SVD (spontaneous vaginal delivery)    x 1  . TB lung, latent    +  quantiferon, neg PPD - s/p treatment - completed       PAST SURGICAL HISTORY: Past Surgical History:  Procedure Laterality Date  . BREAST BIOPSY Right 08/24/2012   benign  . CESAREAN SECTION     x 1  . DILATION AND CURETTAGE OF UTERUS     x 2  . WISDOM TOOTH EXTRACTION       FAMILY HISTORY:  Family History  Problem Relation Age of Onset  . Hypertension Mother   . Breast cancer Mother 45    . Hyperlipidemia Father   . Diabetes Father   . Crohn's disease Father   . Stroke Maternal Grandmother   . Diabetes Maternal Grandmother   . Prostate cancer Paternal Uncle   . Colon cancer Paternal Grandmother   . Breast cancer Other   . Rectal cancer Neg Hx   . Stomach cancer Neg Hx   . Esophageal cancer Neg Hx      SOCIAL HISTORY:  reports that she has never smoked. She has never used smokeless tobacco. She reports previous alcohol use. She reports that she does not use drugs. 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   ALLERGIES: No known allergies   MEDICATIONS:  Current Outpatient Medications  Medication Sig Dispense Refill  . esomeprazole (NEXIUM) 40 MG capsule Take 1 capsule (40 mg total) by mouth daily. 14 capsule 0  . fluticasone (FLONASE) 50 MCG/ACT nasal spray Place 2 sprays into both nostrils daily. 16 g 6  . levonorgestrel (MIRENA) 20 MCG/24HR IUD 1 each by Intrauterine route once.    . cetirizine (ZYRTEC) 10 MG tablet Take 1 mg by mouth daily.    . Probiotic Product (ALIGN PO) Take 1 capsule by mouth.     Current Facility-Administered Medications  Medication Dose Route Frequency Provider Last Rate  Last Admin  . 0.9 %  sodium chloride infusion  500 mL Intravenous Once Thornton Park, MD         REVIEW OF SYSTEMS: On review of systems, the patient reports that she is doing well overall. She denies any chest pain, shortness of breath, cough, fevers, chills, night sweats, unintended weight changes. She denies any bowel or bladder disturbances, and denies abdominal pain, nausea or vomiting. She denies any new musculoskeletal or joint aches or pains. A complete review of systems is obtained and is otherwise negative.     PHYSICAL EXAM:  Wt Readings from Last 3 Encounters:  03/06/20 169 lb (76.7 kg)  03/05/20 167 lb (75.8 kg)  11/17/19 169 lb (76.7 kg)    In general this is a well appearing caucasian female in no acute distress. She's alert and  oriented x4 and appropriate throughout the examination. Cardiopulmonary assessment is negative for acute distress and she exhibits normal effort. Bilateral breast exam is deferred.    ECOG = 0  0 - Asymptomatic (Fully active, able to carry on all predisease activities without restriction)  1 - Symptomatic but completely ambulatory (Restricted in physically strenuous activity but ambulatory and able to carry out work of a light or sedentary nature. For example, light housework, office work)  2 - Symptomatic, <50% in bed during the day (Ambulatory and capable of all self care but unable to carry out any work activities. Up and about more than 50% of waking hours)  3 - Symptomatic, >50% in bed, but not bedbound (Capable of only limited self-care, confined to bed or chair 50% or more of waking hours)  4 - Bedbound (Completely disabled. Cannot carry on any self-care. Totally confined to bed or chair)  5 - Death   Eustace Pen MM, Creech RH, Tormey DC, et al. [phone]). "Toxicity and response criteria of the [facility]". [place] Oncol. 5 (6): 649-55    LABORATORY DATA:  Lab Results  Component Value Date   WBC 8.4 11/17/2019   HGB 14.0 11/17/2019   HCT 42.2 11/17/2019   MCV 93.5 11/17/2019   PLT 285.0 11/17/2019   Lab Results  Component Value Date   NA 141 11/17/2019   K 4.4 11/17/2019   CL 104 11/17/2019   CO2 26 11/17/2019   Lab Results  Component Value Date   ALT 29 11/17/2019   AST 17 11/17/2019   ALKPHOS 89 11/17/2019   BILITOT 0.9 11/17/2019      RADIOGRAPHY: US BREAST LTD UNI LEFT INC AXILLA  Result Date: 02/19/2020 CLINICAL DATA:  49 year old female presenting as a recall from screening for bilateral breast masses. EXAM: ULTRASOUND OF THE BILATERAL BREAST COMPARISON:  Previous exam(s). FINDINGS: Right breast: Targeted ultrasound is performed in the right breast at 8:30 o'clock 5 cm from nipple demonstrating an irregular hypoechoic mass with angular  margins and internal vascularity measuring 0.7 x 0.4 x 0.8 cm. This corresponds to the mammographic finding. Targeted ultrasound of the right axilla demonstrates no suspicious appearing lymph nodes. Left breast: Targeted ultrasound is performed in the left breast at 2 o'clock 3 cm from the nipple demonstrating an oval circumscribed anechoic mass measuring 0.6 x 0.5 x 0.8 cm. There is a single septation which demonstrates blood flow. This corresponds to the mammographic finding. Targeted ultrasound of the left axilla demonstrates no suspicious appearing lymph nodes. IMPRESSION: 1. Right breast mass at the 8:30 o'clock measuring 0.8 cm is indeterminate. Tissue sampling is recommended. 2. Left breast cystic  mass at 2 o'clock measuring 0.8 cm is also indeterminate. Tissue sampling is recommended. RECOMMENDATION: Bilateral ultrasound-guided core needle biopsy (1 site in each breast). This will be scheduled at the patient's earliest convenience. BI-RADS CATEGORY  4: Suspicious. Electronically Signed   By: Audie Pinto M.D.   On: 02/16/2020 16:36   US BREAST LTD UNI RIGHT INC AXILLA  Result Date: 02/16/2020 CLINICAL DATA:  49 year old female presenting as a recall from screening for bilateral breast masses. EXAM: ULTRASOUND OF THE BILATERAL BREAST COMPARISON:  Previous exam(s). FINDINGS: Right breast: Targeted ultrasound is performed in the right breast at 8:30 o'clock 5 cm from nipple demonstrating an irregular hypoechoic mass with angular margins and internal vascularity measuring 0.7 x 0.4 x 0.8 cm. This corresponds to the mammographic finding. Targeted ultrasound of the right axilla demonstrates no suspicious appearing lymph nodes. Left breast: Targeted ultrasound is performed in the left breast at 2 o'clock 3 cm from the nipple demonstrating an oval circumscribed anechoic mass measuring 0.6 x 0.5 x 0.8 cm. There is a single septation which demonstrates blood flow. This corresponds to the mammographic finding.  Targeted ultrasound of the left axilla demonstrates no suspicious appearing lymph nodes. IMPRESSION: 1. Right breast mass at the 8:30 o'clock measuring 0.8 cm is indeterminate. Tissue sampling is recommended. 2. Left breast cystic mass at 2 o'clock measuring 0.8 cm is also indeterminate. Tissue sampling is recommended. RECOMMENDATION: Bilateral ultrasound-guided core needle biopsy (1 site in each breast). This will be scheduled at the patient's earliest convenience. BI-RADS CATEGORY  4: Suspicious. Electronically Signed   By: Audie Pinto M.D.   On: 02/16/2020 16:36   MM CLIP PLACEMENT LEFT  Result Date: 02/20/2020 CLINICAL DATA:  49 year old female presenting for bilateral breast biopsies. EXAM: DIAGNOSTIC BILATERAL MAMMOGRAM POST ULTRASOUND BIOPSY COMPARISON:  Previous exam(s). FINDINGS: Mammographic images were obtained following ultrasound guided biopsy of a right breast mass at 8:30 o'clock. The coil biopsy marking clip is in expected position at the site of biopsy. Mammographic images were obtained following ultrasound guided biopsy of a left breast mass at 2 o'clock. The ribbon biopsy marking clip is in expected position at the site of biopsy. IMPRESSION: Appropriate positioning of the coil shaped biopsy marking clip at the site of biopsy in the right breast at 8:30 o'clock. Appropriate positioning of the ribbon shaped biopsy marking clip at the site of biopsy in the left breast at 2 o'clock. Final Assessment: Post Procedure Mammograms for Marker Placement Electronically Signed   By: Audie Pinto M.D.   On: 02/20/2020 15:57   MM CLIP PLACEMENT RIGHT  Result Date: 02/20/2020 CLINICAL DATA:  49 year old female presenting for bilateral breast biopsies. EXAM: DIAGNOSTIC BILATERAL MAMMOGRAM POST ULTRASOUND BIOPSY COMPARISON:  Previous exam(s). FINDINGS: Mammographic images were obtained following ultrasound guided biopsy of a right breast mass at 8:30 o'clock. The coil biopsy marking clip is in  expected position at the site of biopsy. Mammographic images were obtained following ultrasound guided biopsy of a left breast mass at 2 o'clock. The ribbon biopsy marking clip is in expected position at the site of biopsy. IMPRESSION: Appropriate positioning of the coil shaped biopsy marking clip at the site of biopsy in the right breast at 8:30 o'clock. Appropriate positioning of the ribbon shaped biopsy marking clip at the site of biopsy in the left breast at 2 o'clock. Final Assessment: Post Procedure Mammograms for Marker Placement Electronically Signed   By: Audie Pinto M.D.   On: 02/20/2020 15:57   [place] LT BREAST BX  W LOC DEV 1ST LESION IMG BX SPEC US GUIDE  Addendum Date: 02/22/2020   ADDENDUM REPORT: 02/21/2020 13:27 ADDENDUM: Pathology revealed INTERMEDIATE GRADE DUCTAL CARCINOMA IN SITU of the RIGHT breast, 8:30 o'clock. This was found to be concordant by Dr. Audie Pinto. Pathology revealed PAPILLARY LESION WITH EXTENSIVE APOCRINE METAPLASIA of the LEFT breast, 2 o'clock. This was found to be concordant by Dr. Audie Pinto, with excision recommended. Pathology results were discussed with the patient by telephone. The patient reported doing well after the biopsies with tenderness at the sites. Post biopsy instructions and care were reviewed and questions were answered. The patient was encouraged to call [facility] for any additional concerns. Surgical consultation has been arranged with Dr. Autumn Messing at [facility] on February 27, 2020. Pathology results reported by Stacie Acres RN on 02/21/2020. Electronically Signed   By: Audie Pinto M.D.   On: 02/21/2020 13:27   Result Date: 02/22/2020 CLINICAL DATA:  49 year old female presenting for bilateral breast biopsy. History of previous benign right breast biopsy. EXAM: ULTRASOUND GUIDED BILATERAL BREAST CORE NEEDLE BIOPSY COMPARISON:  Previous exam(s). PROCEDURE: I met with the patient and we  discussed the procedure of ultrasound-guided biopsy, including benefits and alternatives. We discussed the high likelihood of a successful procedure. We discussed the risks of the procedure, including infection, bleeding, tissue injury, clip migration, and inadequate sampling. Informed written consent was given. The usual time-out protocol was performed immediately prior to the procedure. 1.  Lesion quadrant: Lower outer quadrant Using sterile technique and 1% Lidocaine as local anesthetic, under direct ultrasound visualization, a 14 gauge spring-loaded device was used to perform biopsy of a right breast mass at 8:30 o'clock using a inferior approach. At the conclusion of the procedure a coil tissue marker clip was deployed into the biopsy cavity. Follow up 2 view mammogram was performed and dictated separately. 2.  Lesion quadrant: Upper outer quadrant Using sterile technique and 1% Lidocaine as local anesthetic, under direct ultrasound visualization, a 14 gauge spring-loaded device was used to perform biopsy of a left breast mass at 2 o'clock using a lateral approach. At the conclusion of the procedure a ribbon tissue marker clip was deployed into the biopsy cavity. Follow up 2 view mammogram was performed and dictated separately. IMPRESSION: Ultrasound guided biopsy of a right breast mass at 8:30 o'clock and of a left breast mass at 2 o'clock. No apparent complications. Electronically Signed: By: Audie Pinto M.D. On: 02/20/2020 15:59   [place] RT BREAST BX W LOC DEV 1ST LESION IMG BX SPEC US GUIDE  Addendum Date: 02/22/2020   ADDENDUM REPORT: 02/21/2020 13:27 ADDENDUM: Pathology revealed INTERMEDIATE GRADE DUCTAL CARCINOMA IN SITU of the RIGHT breast, 8:30 o'clock. This was found to be concordant by Dr. Audie Pinto. Pathology revealed PAPILLARY LESION WITH EXTENSIVE APOCRINE METAPLASIA of the LEFT breast, 2 o'clock. This was found to be concordant by Dr. Audie Pinto, with excision recommended.  Pathology results were discussed with the patient by telephone. The patient reported doing well after the biopsies with tenderness at the sites. Post biopsy instructions and care were reviewed and questions were answered. The patient was encouraged to call [facility] for any additional concerns. Surgical consultation has been arranged with Dr. Autumn Messing at [facility] on February 27, 2020. Pathology results reported by Stacie Acres RN on 02/21/2020. Electronically Signed   By: Audie Pinto M.D.   On: 02/21/2020 13:27   Result Date:  02/22/2020 CLINICAL DATA:  49 year old female presenting for bilateral breast biopsy. History of previous benign right breast biopsy. EXAM: ULTRASOUND GUIDED BILATERAL BREAST CORE NEEDLE BIOPSY COMPARISON:  Previous exam(s). PROCEDURE: I met with the patient and we discussed the procedure of ultrasound-guided biopsy, including benefits and alternatives. We discussed the high likelihood of a successful procedure. We discussed the risks of the procedure, including infection, bleeding, tissue injury, clip migration, and inadequate sampling. Informed written consent was given. The usual time-out protocol was performed immediately prior to the procedure. 1.  Lesion quadrant: Lower outer quadrant Using sterile technique and 1% Lidocaine as local anesthetic, under direct ultrasound visualization, a 14 gauge spring-loaded device was used to perform biopsy of a right breast mass at 8:30 o'clock using a inferior approach. At the conclusion of the procedure a coil tissue marker clip was deployed into the biopsy cavity. Follow up 2 view mammogram was performed and dictated separately. 2.  Lesion quadrant: Upper outer quadrant Using sterile technique and 1% Lidocaine as local anesthetic, under direct ultrasound visualization, a 14 gauge spring-loaded device was used to perform biopsy of a left breast mass at 2 o'clock using a lateral approach. At the  conclusion of the procedure a ribbon tissue marker clip was deployed into the biopsy cavity. Follow up 2 view mammogram was performed and dictated separately. IMPRESSION: Ultrasound guided biopsy of a right breast mass at 8:30 o'clock and of a left breast mass at 2 o'clock. No apparent complications. Electronically Signed: By: Audie Pinto M.D. On: 02/20/2020 15:59       IMPRESSION/PLAN: 1. ER/PR positive Intermediate grade DCIS of the right breast with papillary lesion in the left. Dr. Lisbeth Renshaw discusses the pathology findings and reviews the nature of noninvasive breast disease. The consensus from the breast conference includes breast conservation with lumpectomy, however the patient's family history is weighing heavily on her and she is thinking she may actually want bilateral mastectomies so she does not have to feel anxiety of routine imaging of the breast that could lead to more workup. She is meeting with genetic counseling tomorrow to also update any further genetic testing. We reviewed that if she were to consider breast conservation that there would be an anticipated role for radiotherapy to reduce the risk of local recurrence. She would also be a candidate for antiestrogen therapy. We discussed the risks, benefits, short, and long term effects of radiotherapy, and the delivery and logistics of radiotherapy. If she were to proceed, Dr. Lisbeth Renshaw would anticipate a course of 6 1/2 weeks of radiotherapy. We will follow along to see how she decides to proceed surgically. 2. Possible genetic predisposition to malignancy. The patient is a candidate for genetic testing given her personal and family history. She was offered referral and is interested in having her genetic testing that was done in her OB/GYN office updated.  This encounter was provided by telemedicine platform MyChart.  The patient has provided two factor identification and has given verbal consent for this type of encounter and has been  advised to only accept a meeting of this type in a secure network environment. The time spent during this encounter was 45 minutes including preparation, discussion, and coordination of the patient's care. The attendants for this meeting include Blenda Nicely, RN, Dr. Lisbeth Renshaw, Hayden Pedro  and Morrison Old.  During the encounter,  Blenda Nicely, RN, Dr. Lisbeth Renshaw, and Hayden Pedro were located at [facility] Radiation Oncology Department.  NOORAH GIAMMONA was located  at home.    The above documentation reflects my direct findings during this shared patient visit. Please see the separate note by Dr. Lisbeth Renshaw on this date for the remainder of the patient's plan of care.    Carola Rhine, PAC

## 2020-03-08 ENCOUNTER — Ambulatory Visit: Payer: Self-pay | Admitting: Licensed Clinical Social Worker

## 2020-03-08 ENCOUNTER — Encounter: Payer: Self-pay | Admitting: *Deleted

## 2020-03-08 DIAGNOSIS — C50411 Malignant neoplasm of upper-outer quadrant of right female breast: Secondary | ICD-10-CM

## 2020-03-08 DIAGNOSIS — Z803 Family history of malignant neoplasm of breast: Secondary | ICD-10-CM

## 2020-03-08 DIAGNOSIS — Z800 Family history of malignant neoplasm of digestive organs: Secondary | ICD-10-CM

## 2020-03-08 DIAGNOSIS — Z8042 Family history of malignant neoplasm of prostate: Secondary | ICD-10-CM

## 2020-03-08 DIAGNOSIS — Z170 Estrogen receptor positive status [ER+]: Secondary | ICD-10-CM

## 2020-03-08 DIAGNOSIS — Z1379 Encounter for other screening for genetic and chromosomal anomalies: Secondary | ICD-10-CM | POA: Insufficient documentation

## 2020-03-08 NOTE — Progress Notes (Signed)
HPI:  Megan [name] was previously seen in the [facility] due to a personal and family history of cancer and concerns regarding a hereditary predisposition to cancer. Please refer to our prior cancer genetics clinic note for more information regarding our discussion, assessment and recommendations, at the time. Megan [name] recent genetic test results were disclosed to her, as were recommendations warranted by these results. These results and recommendations are discussed in more detail below.  CANCER HISTORY:  Oncology History  Malignant neoplasm of upper-outer quadrant of right breast in female, estrogen receptor positive ([place])  02/20/2020 Cancer Staging   Staging form: Breast, AJCC 8th Edition - Clinical stage from 02/20/2020: Stage 0 (cTis (DCIS), cN0, cM0, ER+, PR+) - Signed by Gardenia Phlegm, NP on 02/28/2020   02/28/2020 Initial Diagnosis   Screening mammogram detected bilateral breast masses. Bilateral US showed a 0.8cm right breast mass, 8:30 position, a 0.8cm mass, 2 o'clock position, no axillary adenopathy bilaterally. Biopsy showed intermediate grade DCIS in the right breast, ER+ 95%, PR+ 90%, and in the left breast, a papillary lesion with extensive apocrine metaplasia.     FAMILY HISTORY:  We obtained a detailed, 4-generation family history.  Significant diagnoses are listed below: Family History  Problem Relation Age of Onset  . Hypertension Mother   . Breast cancer Mother 27  . Hyperlipidemia Father   . Diabetes Father   . Crohn's disease Father   . Stroke Maternal Grandmother   . Diabetes Maternal Grandmother   . Prostate cancer Paternal Uncle   . Colon cancer Paternal Grandmother   . Breast cancer Other   . Rectal cancer Neg Hx   . Stomach cancer Neg Hx   . Esophageal cancer Neg Hx    Megan [name] has 2 sons. She has 1 brother, 45, with no cancer history.  Megan [name] mother was diagnosed with breast cancer at 79 and is living at 54.  Patient has 1 full maternal uncle and a half maternal aunt, no cancers. No known cancers in maternal cousins. Maternal grandmother passed at 24. This grandmother had a sister who had breast cancer. Maternal grandfather passed at 10.  Megan [name] father is living at 93. Patient had 5 paternal uncles and 4 paternal aunts. One uncle had prostate cancer. No known cancers in paternal cousins. Paternal grandmother had colon cancer in her 56s and died in her 51s. Paternal grandfather died due to a cardiac issue in his 30s.  Megan [name] is aware of previous family history of genetic testing for hereditary cancer risks. Patient's maternal ancestors are of [place] descent, and paternal ancestors are of English descent. There is no reported Ashkenazi Jewish ancestry. There is no known consanguinity.  GENETIC TEST RESULTS: Patient had genetic testing ordered through Dr. Corinna Capra at [facility] in 2019.  Genetic testing reported out on 11/2018 through the [facility] cancer panel found no pathogenic mutations.  The [facility] gene panel offered by [employer] includes sequencing and deletion/duplication testing of the following 35 genes: APC, ATM, AXIN2, BARD1, BMPR1A, BRCA1, BRCA2, BRIP1, CHD1, CDK4, CDKN2A, CHEK2, EPCAM (large rearrangement only), HOXB13, GALNT12, MLH1, MSH2, MSH3, MSH6, MUTYH, NBN, NTHL1, PALB2, PMS2, PTEN, RAD51C, RAD51D, RNF43, RPS20, SMAD4, STK11, and TP53. Sequencing was performed for select regions of POLE and POLD1, and large rearrangement analysis was performed for select regions of GREM1.   The test report has been scanned into EPIC and is located under the Molecular Pathology section of the Results Review tab.  A portion of the result report is included below for reference.      We discussed with Megan [name] that because current genetic testing is not perfect, it is possible there may be a gene mutation in one of these genes that current testing cannot detect, but  that chance is small.  We also discussed, that there could be another gene that has not yet been discovered, or that we have not yet tested, that is responsible for the cancer diagnoses in the family. It is also possible there is a hereditary cause for the cancer in the family that Megan [name] did not inherit and therefore was not identified in her testing.  Therefore, it is important to remain in touch with cancer genetics in the future so that we can continue to offer Megan [name] the most up to date genetic testing.   ADDITIONAL GENETIC TESTING: We discussed with Megan [name] that her genetic testing was fairly extensive.  If there are genes identified to increase cancer risk that can be analyzed in the future, we would be happy to discuss and coordinate this testing at that time.    CANCER SCREENING RECOMMENDATIONS: Megan [name] test result is considered negative (normal).  This means that we have not identified a hereditary cause for her personal and family history of cancer at this time. Most cancers happen by chance and this negative test suggests that her cancer may fall into this category.    While reassuring, this does not definitively rule out a hereditary predisposition to cancer. It is still possible that there could be genetic mutations that are undetectable by current technology. There could be genetic mutations in genes that have not been tested or identified to increase cancer risk.  Therefore, it is recommended she continue to follow the cancer management and screening guidelines provided by her oncology and primary healthcare provider.   An individual's cancer risk and medical management are not determined by genetic test results alone. Overall cancer risk assessment incorporates additional factors, including personal medical history, family history, and any available genetic information that may result in a personalized plan for cancer prevention and surveillance.  RECOMMENDATIONS FOR FAMILY  MEMBERS:  Relatives in this family might be at some increased risk of developing cancer, over the general population risk, simply due to the family history of cancer.  We recommended female relatives in this family have a yearly mammogram beginning at age 63, or 91 years younger than the earliest onset of cancer, an annual clinical breast exam, and perform monthly breast self-exams. Female relatives in this family should also have a gynecological exam as recommended by their primary provider. All family members should have a colonoscopy by age 65, or as directed by their physicians.  It is also possible there is a hereditary cause for the cancer in Megan [name]'s family that she did not inherit and therefore was not identified in her.  Based on Megan [name]'s family history, we recommended her mother have genetic counseling and testing. Megan [name] will let us know if we can be of any assistance in coordinating genetic counseling and/or testing for these family members.  FOLLOW-UP: Lastly, we discussed with Ms. Tull that cancer genetics is a rapidly advancing field and it is possible that new genetic tests will be appropriate for her and/or her family members in the future. We encouraged her to remain in contact with cancer genetics on an annual basis so we can update her personal and family histories and let her  know of advances in cancer genetics that may benefit this family.   Our contact number was provided. Ms. Choi questions were answered to her satisfaction, and she knows she is welcome to call us at anytime with additional questions or concerns.   Faith Rogue, MS, [facility] Genetic Counselor [email or web address] Phone: [phone]

## 2020-03-11 ENCOUNTER — Other Ambulatory Visit: Payer: Self-pay

## 2020-03-11 ENCOUNTER — Encounter: Payer: Self-pay | Admitting: Plastic Surgery

## 2020-03-11 ENCOUNTER — Ambulatory Visit: Payer: 59 | Admitting: Plastic Surgery

## 2020-03-11 VITALS — BP 118/81 | HR 76 | Temp 97.1°F | Ht 65.0 in | Wt 169.0 lb

## 2020-03-11 DIAGNOSIS — C50411 Malignant neoplasm of upper-outer quadrant of right female breast: Secondary | ICD-10-CM

## 2020-03-11 DIAGNOSIS — Z170 Estrogen receptor positive status [ER+]: Secondary | ICD-10-CM

## 2020-03-11 DIAGNOSIS — Z719 Counseling, unspecified: Secondary | ICD-10-CM

## 2020-03-11 NOTE — Progress Notes (Addendum)
Patient ID: Megan [name], female    DOB: September 04, 1971, 49 y.o.   MRN: [id number]   Chief Complaint  Patient presents with  . Breast Cancer    The patient is a 49 year old white female here for a consultation for breast reconstruction.  She was recently diagnosed with right breast ductal carcinoma in situ after a screening mammogram detected bilateral breast masses.  She was followed up with in early March which showed a 0.8 cm right breast mass at the 2 o'clock position.  She did not have any axillary adenopathy.  The biopsy showed intermediate grade DCIS of the right breast.  It was and progesterone positive.  In the left breast she had a papillary lesion with extensive apocrine metaplasia.  Lumpectomies were discussed with the patient has decided on bilateral mastectomies.  So far radiation is not planned.  She has an extensive family history of breast cancer and prostate cancer she was tested genetically and found to be negative. She is 5 feet 5 inches tall and weighs 169.  Her preoperative bra size is a 36 B/C.  She would like to be around about the same size.  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  She is married and has 2 sons.     Review of Systems  Constitutional: Negative for activity change and appetite change.  HENT: Negative.   Eyes: Negative.   Respiratory: Negative.  Negative for chest tightness.   Cardiovascular: Negative.  Negative for leg swelling.  Gastrointestinal: Negative.  Negative for abdominal distention and abdominal pain.  Endocrine: Negative.   Genitourinary: Negative.   Musculoskeletal: Negative.   Psychiatric/Behavioral: Negative.     Past Medical History:  Diagnosis Date  . Allergic rhinitis   . Allergy   . Family history of breast cancer   . Family history of colon cancer   . Family history of prostate cancer   . GERD (gastroesophageal reflux disease)   . SVD (spontaneous vaginal delivery)    x 1  . TB lung, latent    +  quantiferon, neg PPD - s/p  treatment - completed    Past Surgical History:  Procedure Laterality Date  . BREAST BIOPSY Right 08/24/2012   benign  . CESAREAN SECTION     x 1  . DILATION AND CURETTAGE OF UTERUS     x 2  . WISDOM TOOTH EXTRACTION        Current Outpatient Medications:  .  esomeprazole (NEXIUM) 40 MG capsule, Take 1 capsule (40 mg total) by mouth daily., Disp: 14 capsule, Rfl: 0 .  fluticasone (FLONASE) 50 MCG/ACT nasal spray, Place 2 sprays into both nostrils daily., Disp: 16 g, Rfl: 6 .  levonorgestrel (MIRENA) 20 MCG/24HR IUD, 1 each by Intrauterine route once., Disp: , Rfl:   Current Facility-Administered Medications:  .  0.9 %  sodium chloride infusion, 500 mL, Intravenous, Once, [place], MD   Objective:   Vitals:   03/11/20 1050  BP: 118/81  Pulse: 76  Temp: (!) 97.1 F (36.2 C)  SpO2: 98%    Physical Exam Vitals and nursing note reviewed.  Constitutional:      Appearance: Normal appearance.  HENT:     Head: Normocephalic and atraumatic.  Eyes:     Extraocular Movements: Extraocular movements intact.  Cardiovascular:     Rate and Rhythm: Normal rate.     Pulses: Normal pulses.  Pulmonary:     Effort: Pulmonary effort is normal. No respiratory distress.  Abdominal:     General: Abdomen is flat. There is no distension.     Tenderness: There is no abdominal tenderness.  Skin:    Capillary Refill: Capillary refill takes less than 2 seconds.  Neurological:     General: No focal deficit present.     Mental Status: She is alert and oriented to person, place, and time.  Psychiatric:        Mood and Affect: Mood normal.        Behavior: Behavior normal.        Thought Content: Thought content normal.        Judgment: Judgment normal.     Assessment & Plan:  Malignant neoplasm of upper-outer quadrant of right breast in female, estrogen receptor positive ([place])  We had a detailed conversation about the patient's options for breast reconstruction. Several  reconstruction options were explained to the patient.  It is important to remember that breast reconstruction is an optional procedure. Reconstruction often requires several stages of surgery and this means more than one operation.  The surgeries are often done several months apart.  The entire process from start to finish can take a year or more. The major goal of breast reconstruction is to look normal in clothing. There will always be scars and a difference noticeable without clothes.  This is true for asymmetries where both breasts will not be identical.  Surgery may be needed or desired to the non-cancerous breast in order to achieve better symmetry and satisfactory results.  Regardless of the reconstructive method, there is always risks and the possibility that the procedure will fail or have complications.  This couls required additional surgeries.    We discussed the available methods of breast reconstruction and included:  1. Tissue expander with Acellular dermal matrix followed by implant based reconstruction. This can be done as one surgery or multiple surgeries.  2. Autologous reconstruction can include using a muscle or tissue from another area of the body for the reconstruction.  3. Combined procedures like the latissismus dorsi flaps that often uses the muscle with an expander or implant.  For each of the method discussed the risks, benefits, scars and recovery time were discussed in detail. Specific risks included bleeding, infection, hematoma, seroma, scarring, pain, wound healing complications, flap loss, fat necrosis, capsular contracture, need for implant removal, donor site complications, bulge, hernia, umbilical necrosis, need for urgent reoperation, and need for dressing changes.   After the options were discussed we focused on the patient's desires and the procedure that was best for her based on all the information.  A total of 50 minutes of face-to-face time was spent in this  encounter, of which >50% was spent in counseling.    The patient is interested in bilateral mastectomies.  She would like reconstruction with expanders and Flex HD placement.  We discussed the prepack and the submuscular approach.  She would like to have the submuscular approach.  She understands that this would require 2 surgeries for placement of the expander and then approximately 3 months later removal of the expander and placement of the implant.  I have placed a call to Dr. Marlou Starks and will discuss the above information with him.  Pictures were obtained of the patient and placed in the chart with the patient's or guardian's permission.  Wallace Going, DO    The 21st Century Cures Act was signed into law in 2016 which includes the topic of electronic health records.  This provides immediate  access to information in MyChart.  This includes consultation notes, operative notes, office notes, lab results and pathology reports.  If you have any questions about what you read please let us know at your next visit or call us at the office.  We are right here with you.

## 2020-03-14 ENCOUNTER — Encounter: Payer: Self-pay | Admitting: *Deleted

## 2020-03-14 ENCOUNTER — Telehealth: Payer: Self-pay | Admitting: Hematology and Oncology

## 2020-03-14 NOTE — Telephone Encounter (Signed)
Scheduled apt per 4/1 sch - msg - pt aware of appt date and time

## 2020-03-15 ENCOUNTER — Ambulatory Visit
Admission: RE | Admit: 2020-03-15 | Discharge: 2020-03-15 | Disposition: A | Discharge status: Home / Self Care | Payer: 59 | Source: Ambulatory Visit | Attending: General Surgery | Admitting: General Surgery

## 2020-03-15 ENCOUNTER — Other Ambulatory Visit: Payer: Self-pay

## 2020-03-15 DIAGNOSIS — D0511 Intraductal carcinoma in situ of right breast: Secondary | ICD-10-CM | POA: Diagnosis not present

## 2020-03-15 MED ORDER — GADOBUTROL 1 MMOL/ML IV SOLN
6.0000 mL | Freq: Once | INTRAVENOUS | Status: AC | PRN
  Administered 2020-03-15: 6 mL via INTRAVENOUS

## 2020-03-25 ENCOUNTER — Encounter: Payer: Self-pay | Admitting: *Deleted

## 2020-03-26 ENCOUNTER — Ambulatory Visit (INDEPENDENT_AMBULATORY_CARE_PROVIDER_SITE_OTHER): Payer: 59 | Admitting: Surgical

## 2020-03-26 ENCOUNTER — Other Ambulatory Visit: Payer: Self-pay

## 2020-03-26 ENCOUNTER — Encounter: Payer: Self-pay | Admitting: Surgical

## 2020-03-26 VITALS — BP 125/84 | HR 69 | Temp 98.2°F | Ht 66.0 in | Wt 168.0 lb

## 2020-03-26 DIAGNOSIS — Z170 Estrogen receptor positive status [ER+]: Secondary | ICD-10-CM

## 2020-03-26 DIAGNOSIS — C50411 Malignant neoplasm of upper-outer quadrant of right female breast: Secondary | ICD-10-CM

## 2020-03-26 MED ORDER — CEPHALEXIN 500 MG PO CAPS: 500.0000 mg | ORAL_CAPSULE | Freq: Four times a day (QID) | ORAL | 0 refills | Status: AC

## 2020-03-26 MED ORDER — HYDROCODONE-ACETAMINOPHEN 5-325 MG PO TABS: 1.0000 | ORAL_TABLET | Freq: Four times a day (QID) | ORAL | 0 refills | Status: AC | PRN

## 2020-03-26 MED ORDER — ONDANSETRON HCL 4 MG PO TABS: 4.0000 mg | ORAL_TABLET | Freq: Three times a day (TID) | ORAL | 0 refills | Status: DC | PRN

## 2020-03-26 MED FILL — CEPHALEXIN 500 MG CAPSULE: 500 | 5 days supply | Qty: 20 | Fill #0

## 2020-03-26 MED FILL — HYDROCODON-APAP 5-325: 5-325 | 5 days supply | Qty: 20 | Fill #0

## 2020-03-26 MED FILL — ONDANSETRON HCL 4 MG TABLET: 4 | 7 days supply | Qty: 20 | Fill #0

## 2020-03-26 NOTE — Progress Notes (Signed)
Patient ID: Megan [name], female    DOB: [birth year], 49 y.o.   MRN: [id number]  Chief Complaint  Patient presents with  . Pre-op Exam      ICD-10-CM   1. Malignant neoplasm of upper-outer quadrant of right breast in female, estrogen receptor positive ([place])  C50.411    Z17.0     History of Present Illness: Megan [name] is a 49 y.o.  female  with a recent diagnosis of right breast ductal carcinoma in situ.  This was found after screening mammogram detected bilateral breast masses.  Patient was also noted to have a left breast papillary lesion with extensive apocrine metaplasia.  Patient has decided on bilateral mastectomies.  She presents for preoperative evaluation for upcoming procedure, bilateral breast reconstruction with placement of expander and Flex HD by Dr. Marla Roe after right nipple sparing mastectomy with right sentinel lymph node biopsy and left prophylactic mastectomy by Dr. Marlou Starks on 04/08/2020.  Patient is unsure if she has had a general anesthetic in the past, she does report no family history of malignant hyperthermia.  She has underwent D&C, but is unsure if she had general anesthetic or not.  Patient has a past medical history of allergic rhinitis, GERD.  She does report a history of MRSA, reports previously having a MRSA abscess on her right lower back/buttock that was drained and cultured. This was approximately 6 years ago  No hx or fmhx of dvt/pe. No hx or fmhx of bleed/clot disorders. Non smoker. Not taking any blood thinners. No cardiac disease. No pulmonary disease.  Preoperative bra size is 36 B/C.  She would like to be about the same size. Patient is a Marine scientist for [employer]  Past Medical History: Allergies: Allergies  Allergen Reactions  . No Known Allergies     Current Medications:  Current Outpatient Medications:  .  cephALEXin (KEFLEX) 500 MG capsule, Take 1 capsule (500 mg total) by mouth 4 (four) times daily for 5 days., Disp: 20 capsule, Rfl: 0 .   esomeprazole (NEXIUM) 40 MG capsule, Take 1 capsule (40 mg total) by mouth daily., Disp: 14 capsule, Rfl: 0 .  fluticasone (FLONASE) 50 MCG/ACT nasal spray, Place 2 sprays into both nostrils daily., Disp: 16 g, Rfl: 6 .  HYDROcodone-acetaminophen (NORCO) 5-325 MG tablet, Take 1 tablet by mouth every 6 (six) hours as needed for up to 5 days for severe pain., Disp: 20 tablet, Rfl: 0 .  levonorgestrel (MIRENA) 20 MCG/24HR IUD, 1 each by Intrauterine route once., Disp: , Rfl:  .  ondansetron (ZOFRAN) 4 MG tablet, Take 1 tablet (4 mg total) by mouth every 8 (eight) hours as needed for nausea or vomiting., Disp: 20 tablet, Rfl: 0  Current Facility-Administered Medications:  .  0.9 %  sodium chloride infusion, 500 mL, Intravenous, Once, [place] Park, MD  Past Medical Problems: Past Medical History:  Diagnosis Date  . Allergic rhinitis   . Allergy   . Family history of breast cancer   . Family history of colon cancer   . Family history of prostate cancer   . GERD (gastroesophageal reflux disease)   . SVD (spontaneous vaginal delivery)    x 1  . TB lung, latent    +  quantiferon, neg PPD - s/p treatment - completed    Past Surgical History: Past Surgical History:  Procedure Laterality Date  . BREAST BIOPSY Right 08/24/2012   benign  . CESAREAN SECTION     x 1  . DILATION  AND CURETTAGE OF UTERUS     x 2  . WISDOM TOOTH EXTRACTION      Social History: Social History   Socioeconomic History  . Marital status: Married    Spouse name: Not on file  . Number of children: 2  . Years of education: Not on file  . Highest education level: Not on file  Occupational History  . Occupation: Nurse Tourist information centre manager  Tobacco Use  . Smoking status: Never Smoker  . Smokeless tobacco: Never Used  Substance and Sexual Activity  . Alcohol use: Not Currently    Comment: twice a year  . Drug use: No  . Sexual activity: Yes    Birth control/protection: I.U.D.    Comment: Mirena IUD-Inserted  2018  Other Topics Concern  . Not on file  Social History Narrative   Occupation: Therapist, sports - [employer]   Married - 2 kids (4 and 8 in 2016)   Regular exercise- yes   Eating a healthy diet   Spiritual: catholic   Social Determinants of Health   Financial Resource Strain:   . Difficulty of Paying Living Expenses:   Food Insecurity:   . Worried About Charity fundraiser in the Last Year:   . Arboriculturist in the Last Year:   Transportation Needs:   . Film/video editor (Medical):   Marland Kitchen Lack of Transportation (Non-Medical):   Physical Activity:   . Days of Exercise per Week:   . Minutes of Exercise per Session:   Stress:   . Feeling of Stress :   Social Connections:   . Frequency of Communication with Friends and Family:   . Frequency of Social Gatherings with Friends and Family:   . Attends Religious Services:   . Active Member of Clubs or Organizations:   . Attends Archivist Meetings:   Marland Kitchen Marital Status:   Intimate Partner Violence:   . Fear of Current or Ex-Partner:   . Emotionally Abused:   Marland Kitchen Physically Abused:   . Sexually Abused:     Family History: Family History  Problem Relation Age of Onset  . Hypertension Mother   . Breast cancer Mother 51  . Hyperlipidemia Father   . Diabetes Father   . Crohn's disease Father   . Stroke Maternal Grandmother   . Diabetes Maternal Grandmother   . Prostate cancer Paternal Uncle   . Colon cancer Paternal Grandmother   . Breast cancer Other   . Rectal cancer Neg Hx   . Stomach cancer Neg Hx   . Esophageal cancer Neg Hx     Review of Systems: Review of Systems  Constitutional: Negative.   Respiratory: Negative.   Cardiovascular: Negative.   Gastrointestinal: Negative.   Psychiatric/Behavioral: Negative.     Physical Exam: Vital Signs BP 125/84 (BP Location: Left Arm, Patient Position: Sitting, Cuff Size: Normal)   Pulse 69   Temp 98.2 F (36.8 C) (Temporal)   Ht 5\' 6"  (1.676 m)   Wt 168 lb  (76.2 kg)   SpO2 98%   BMI 27.12 kg/m  Physical Exam Constitutional:      General: She is not in acute distress.    Appearance: Normal appearance. She is not ill-appearing.  HENT:     Head: Normocephalic and atraumatic.  Eyes:     Pupils: Pupils are equal, round Neck:     Musculoskeletal: Normal range of motion.  Cardiovascular:     Rate and Rhythm: Normal rate and regular rhythm.  Pulses: Normal pulses.     Heart sounds: Normal heart sounds. No murmur.  Pulmonary:     Effort: Pulmonary effort is normal. No respiratory distress.     Breath sounds: Normal breath sounds. No wheezing.  Abdominal:     General: Abdomen is flat. There is no distension.     Palpations: Abdomen is soft.     Tenderness: There is no abdominal tenderness.  Musculoskeletal: Normal range of motion.  Skin:    General: Skin is warm and dry.     Findings: No erythema or rash.  Neurological:     General: No focal deficit present.     Mental Status: She is alert and oriented to person, place, and time. Mental status is at baseline.     Motor: No weakness.  Psychiatric:        Mood and Affect: Mood normal.        Behavior: Behavior normal.    Assessment/Plan:  The patient is scheduled for bilateral breast reconstruction with placement of expanders and Flex HD with Dr. Marla Roe on 04/08/2020.  Risks, benefits, and alternatives of procedure discussed, questions answered and consent obtained.    Patient provided with general consent form covering surgical risks associated with surgical intervention as well as consent form in regards to breast reconstruction. Patient was allocated adequate time prior to appointment to read through, initial and sign that they agree that they are aware of the risks associated. We also covered the risks in person during this pre-op appointment. I answered all of the patient's questions and informed them to call with any further questions or concerns prior to surgery and I would be  happy to discuss with them further.  The risks that can be encountered with and after placement of a breast expander placement were discussed and include the following but not limited to these: bleeding, infection, delayed healing, anesthesia risks, skin sensation changes, injury to structures including nerves, blood vessels, and muscles which may be temporary or permanent, allergies to tape, suture materials and glues, blood products, topical preparations or injected agents, skin contour irregularities, skin discoloration and swelling, deep vein thrombosis, cardiac and pulmonary complications, pain, which may persist, fluid accumulation, wrinkling of the skin over the expander, changes in nipple or breast sensation, expander leakage or rupture, faulty position of the expander, persistent pain, formation of tight scar tissue around the expander (capsular contracture), possible need for revisional surgery or staged procedures.  Mammogram: Screening mammogram found abnormalities and recommended additional imaging to evaluate.  Patient found to have right breast ductal carcinoma in situ and left breast papillary lesion with extensive apocrine metaplasia. Smoking status: Non-smoker Caprini score: 7, high, history of cancer (breast), currently on levonorgestrel Mirena, surgery time greater than 45 minutes, BMI greater than 29, 49 year old.  Recommend mechanical prophylaxis Intra-Op, postop, early ambulation, possible chemoprophylaxis postoperatively.  Rx sent to pharmacy: Zofran, Norco, Keflex. Covid test scheduled.  Electronically signed by: Carola Rhine Lynesha Bango, PA-C 03/26/2020 10:13 AM

## 2020-03-26 NOTE — H&P (View-Only) (Signed)
Patient ID: Megan [name], female    DOB: Jul 06, 1971, 49 y.o.   MRN: [id number]  Chief Complaint  Patient presents with  . Pre-op Exam      ICD-10-CM   1. Malignant neoplasm of upper-outer quadrant of right breast in female, estrogen receptor positive ([place])  C50.411    Z17.0     History of Present Illness: Megan [name] is a 49 y.o.  female  with a recent diagnosis of right breast ductal carcinoma in situ.  This was found after screening mammogram detected bilateral breast masses.  Patient was also noted to have a left breast papillary lesion with extensive apocrine metaplasia.  Patient has decided on bilateral mastectomies.  She presents for preoperative evaluation for upcoming procedure, bilateral breast reconstruction with placement of expander and Flex HD by Dr. Marla Roe after right nipple sparing mastectomy with right sentinel lymph node biopsy and left prophylactic mastectomy by Dr. Marlou Starks on 04/08/2020.  Patient is unsure if she has had a general anesthetic in the past, she does report no family history of malignant hyperthermia.  She has underwent D&C, but is unsure if she had general anesthetic or not.  Patient has a past medical history of allergic rhinitis, GERD.  She does report a history of MRSA, reports previously having a MRSA abscess on her right lower back/buttock that was drained and cultured. This was approximately 6 years ago  No hx or fmhx of dvt/pe. No hx or fmhx of bleed/clot disorders. Non smoker. Not taking any blood thinners. No cardiac disease. No pulmonary disease.  Preoperative bra size is 36 B/C.  She would like to be about the same size. Patient is a Marine scientist for [employer]  Past Medical History: Allergies: Allergies  Allergen Reactions  . No Known Allergies     Current Medications:  Current Outpatient Medications:  .  cephALEXin (KEFLEX) 500 MG capsule, Take 1 capsule (500 mg total) by mouth 4 (four) times daily for 5 days., Disp: 20 capsule, Rfl: 0 .   esomeprazole (NEXIUM) 40 MG capsule, Take 1 capsule (40 mg total) by mouth daily., Disp: 14 capsule, Rfl: 0 .  fluticasone (FLONASE) 50 MCG/ACT nasal spray, Place 2 sprays into both nostrils daily., Disp: 16 g, Rfl: 6 .  HYDROcodone-acetaminophen (NORCO) 5-325 MG tablet, Take 1 tablet by mouth every 6 (six) hours as needed for up to 5 days for severe pain., Disp: 20 tablet, Rfl: 0 .  levonorgestrel (MIRENA) 20 MCG/24HR IUD, 1 each by Intrauterine route once., Disp: , Rfl:  .  ondansetron (ZOFRAN) 4 MG tablet, Take 1 tablet (4 mg total) by mouth every 8 (eight) hours as needed for nausea or vomiting., Disp: 20 tablet, Rfl: 0  Current Facility-Administered Medications:  .  0.9 %  sodium chloride infusion, 500 mL, Intravenous, Once, [place], MD  Past Medical Problems: Past Medical History:  Diagnosis Date  . Allergic rhinitis   . Allergy   . Family history of breast cancer   . Family history of colon cancer   . Family history of prostate cancer   . GERD (gastroesophageal reflux disease)   . SVD (spontaneous vaginal delivery)    x 1  . TB lung, latent    +  quantiferon, neg PPD - s/p treatment - completed    Past Surgical History: Past Surgical History:  Procedure Laterality Date  . BREAST BIOPSY Right 08/24/2012   benign  . CESAREAN SECTION     x 1  . DILATION  AND CURETTAGE OF UTERUS     x 2  . WISDOM TOOTH EXTRACTION      Social History: Social History   Socioeconomic History  . Marital status: Married    Spouse name: Not on file  . Number of children: 2  . Years of education: Not on file  . Highest education level: Not on file  Occupational History  . Occupation: Nurse Tourist information centre manager  Tobacco Use  . Smoking status: Never Smoker  . Smokeless tobacco: Never Used  Substance and Sexual Activity  . Alcohol use: Not Currently    Comment: twice a year  . Drug use: No  . Sexual activity: Yes    Birth control/protection: I.U.D.    Comment: Mirena IUD-Inserted  2018  Other Topics Concern  . Not on file  Social History Narrative   Occupation: Therapist, sports - [employer]   Married - 2 kids (71 and 8 in 2016)   Regular exercise- yes   Eating a healthy diet   Spiritual: catholic   Social Determinants of Health   Financial Resource Strain:   . Difficulty of Paying Living Expenses:   Food Insecurity:   . Worried About Charity fundraiser in the Last Year:   . Arboriculturist in the Last Year:   Transportation Needs:   . Film/video editor (Medical):   Marland Kitchen Lack of Transportation (Non-Medical):   Physical Activity:   . Days of Exercise per Week:   . Minutes of Exercise per Session:   Stress:   . Feeling of Stress :   Social Connections:   . Frequency of Communication with Friends and Family:   . Frequency of Social Gatherings with Friends and Family:   . Attends Religious Services:   . Active Member of Clubs or Organizations:   . Attends Archivist Meetings:   Marland Kitchen Marital Status:   Intimate Partner Violence:   . Fear of Current or Ex-Partner:   . Emotionally Abused:   Marland Kitchen Physically Abused:   . Sexually Abused:     Family History: Family History  Problem Relation Age of Onset  . Hypertension Mother   . Breast cancer Mother 25  . Hyperlipidemia Father   . Diabetes Father   . Crohn's disease Father   . Stroke Maternal Grandmother   . Diabetes Maternal Grandmother   . Prostate cancer Paternal Uncle   . Colon cancer Paternal Grandmother   . Breast cancer Other   . Rectal cancer Neg Hx   . Stomach cancer Neg Hx   . Esophageal cancer Neg Hx     Review of Systems: Review of Systems  Constitutional: Negative.   Respiratory: Negative.   Cardiovascular: Negative.   Gastrointestinal: Negative.   Psychiatric/Behavioral: Negative.     Physical Exam: Vital Signs BP 125/84 (BP Location: Left Arm, Patient Position: Sitting, Cuff Size: Normal)   Pulse 69   Temp 98.2 F (36.8 C) (Temporal)   Ht 5\' 6"  (1.676 m)   Wt 168 lb  (76.2 kg)   SpO2 98%   BMI 27.12 kg/m  Physical Exam Constitutional:      General: She is not in acute distress.    Appearance: Normal appearance. She is not ill-appearing.  HENT:     Head: Normocephalic and atraumatic.  Eyes:     Pupils: Pupils are equal, round Neck:     Musculoskeletal: Normal range of motion.  Cardiovascular:     Rate and Rhythm: Normal rate and regular rhythm.  Pulses: Normal pulses.     Heart sounds: Normal heart sounds. No murmur.  Pulmonary:     Effort: Pulmonary effort is normal. No respiratory distress.     Breath sounds: Normal breath sounds. No wheezing.  Abdominal:     General: Abdomen is flat. There is no distension.     Palpations: Abdomen is soft.     Tenderness: There is no abdominal tenderness.  Musculoskeletal: Normal range of motion.  Skin:    General: Skin is warm and dry.     Findings: No erythema or rash.  Neurological:     General: No focal deficit present.     Mental Status: She is alert and oriented to person, place, and time. Mental status is at baseline.     Motor: No weakness.  Psychiatric:        Mood and Affect: Mood normal.        Behavior: Behavior normal.    Assessment/Plan:  The patient is scheduled for bilateral breast reconstruction with placement of expanders and Flex HD with Dr. Marla Roe on 04/08/2020.  Risks, benefits, and alternatives of procedure discussed, questions answered and consent obtained.    Patient provided with general consent form covering surgical risks associated with surgical intervention as well as consent form in regards to breast reconstruction. Patient was allocated adequate time prior to appointment to read through, initial and sign that they agree that they are aware of the risks associated. We also covered the risks in person during this pre-op appointment. I answered all of the patient's questions and informed them to call with any further questions or concerns prior to surgery and I would be  happy to discuss with them further.  The risks that can be encountered with and after placement of a breast expander placement were discussed and include the following but not limited to these: bleeding, infection, delayed healing, anesthesia risks, skin sensation changes, injury to structures including nerves, blood vessels, and muscles which may be temporary or permanent, allergies to tape, suture materials and glues, blood products, topical preparations or injected agents, skin contour irregularities, skin discoloration and swelling, deep vein thrombosis, cardiac and pulmonary complications, pain, which may persist, fluid accumulation, wrinkling of the skin over the expander, changes in nipple or breast sensation, expander leakage or rupture, faulty position of the expander, persistent pain, formation of tight scar tissue around the expander (capsular contracture), possible need for revisional surgery or staged procedures.  Mammogram: Screening mammogram found abnormalities and recommended additional imaging to evaluate.  Patient found to have right breast ductal carcinoma in situ and left breast papillary lesion with extensive apocrine metaplasia. Smoking status: Non-smoker Caprini score: 7, high, history of cancer (breast), currently on levonorgestrel Mirena, surgery time greater than 45 minutes, BMI greater than 34, 49 year old.  Recommend mechanical prophylaxis Intra-Op, postop, early ambulation, possible chemoprophylaxis postoperatively.  Rx sent to pharmacy: Zofran, Norco, Keflex. Covid test scheduled.  Electronically signed by: Carola Rhine Daylynn Stumpp, PA-C 03/26/2020 10:13 AM

## 2020-04-01 ENCOUNTER — Encounter (HOSPITAL_BASED_OUTPATIENT_CLINIC_OR_DEPARTMENT_OTHER): Payer: Self-pay | Admitting: General Surgery

## 2020-04-01 ENCOUNTER — Other Ambulatory Visit: Payer: Self-pay

## 2020-04-04 ENCOUNTER — Other Ambulatory Visit (HOSPITAL_COMMUNITY)
Admission: RE | Admit: 2020-04-04 | Discharge: 2020-04-04 | Disposition: A | Discharge status: Home / Self Care | Payer: 59 | Source: Ambulatory Visit | Attending: General Surgery | Admitting: General Surgery

## 2020-04-04 DIAGNOSIS — Z01812 Encounter for preprocedural laboratory examination: Secondary | ICD-10-CM | POA: Diagnosis not present

## 2020-04-04 DIAGNOSIS — Z20822 Contact with and (suspected) exposure to covid-19: Secondary | ICD-10-CM | POA: Diagnosis not present

## 2020-04-04 LAB — SARS CORONAVIRUS 2 (TAT 6-24 HRS): SARS Coronavirus 2: NEGATIVE

## 2020-04-04 NOTE — Progress Notes (Signed)

## 2020-04-08 ENCOUNTER — Ambulatory Visit (HOSPITAL_BASED_OUTPATIENT_CLINIC_OR_DEPARTMENT_OTHER): Payer: 59 | Admitting: Anesthesiology

## 2020-04-08 ENCOUNTER — Ambulatory Visit (HOSPITAL_COMMUNITY)
Admission: RE | Admit: 2020-04-08 | Discharge: 2020-04-08 | Disposition: A | Discharge status: Home / Self Care | Payer: 59 | Source: Ambulatory Visit | Attending: General Surgery | Admitting: General Surgery

## 2020-04-08 ENCOUNTER — Encounter (HOSPITAL_BASED_OUTPATIENT_CLINIC_OR_DEPARTMENT_OTHER)
Admission: RE | Disposition: A | Discharge status: Home / Self Care | Payer: Self-pay | Source: Home / Self Care | Attending: Plastic Surgery

## 2020-04-08 ENCOUNTER — Other Ambulatory Visit: Payer: Self-pay

## 2020-04-08 ENCOUNTER — Observation Stay (HOSPITAL_BASED_OUTPATIENT_CLINIC_OR_DEPARTMENT_OTHER)
Admission: RE | Admit: 2020-04-08 | Discharge: 2020-04-09 | Disposition: A | Discharge status: Home / Self Care | Payer: 59 | Attending: Plastic Surgery | Admitting: Plastic Surgery

## 2020-04-08 ENCOUNTER — Encounter (HOSPITAL_BASED_OUTPATIENT_CLINIC_OR_DEPARTMENT_OTHER): Payer: Self-pay | Admitting: General Surgery

## 2020-04-08 ENCOUNTER — Other Ambulatory Visit: Payer: Self-pay | Admitting: Anatomic Pathology & Clinical Pathology

## 2020-04-08 DIAGNOSIS — D0511 Intraductal carcinoma in situ of right breast: Secondary | ICD-10-CM

## 2020-04-08 DIAGNOSIS — Z803 Family history of malignant neoplasm of breast: Secondary | ICD-10-CM | POA: Diagnosis not present

## 2020-04-08 DIAGNOSIS — N6082 Other benign mammary dysplasias of left breast: Secondary | ICD-10-CM | POA: Diagnosis not present

## 2020-04-08 DIAGNOSIS — G8918 Other acute postprocedural pain: Secondary | ICD-10-CM | POA: Diagnosis not present

## 2020-04-08 DIAGNOSIS — Z8719 Personal history of other diseases of the digestive system: Secondary | ICD-10-CM | POA: Insufficient documentation

## 2020-04-08 DIAGNOSIS — D242 Benign neoplasm of left breast: Secondary | ICD-10-CM | POA: Diagnosis not present

## 2020-04-08 DIAGNOSIS — N6489 Other specified disorders of breast: Secondary | ICD-10-CM | POA: Diagnosis not present

## 2020-04-08 DIAGNOSIS — Z833 Family history of diabetes mellitus: Secondary | ICD-10-CM | POA: Diagnosis not present

## 2020-04-08 DIAGNOSIS — Z8379 Family history of other diseases of the digestive system: Secondary | ICD-10-CM | POA: Insufficient documentation

## 2020-04-08 DIAGNOSIS — Z79899 Other long term (current) drug therapy: Secondary | ICD-10-CM | POA: Diagnosis not present

## 2020-04-08 DIAGNOSIS — Z4001 Encounter for prophylactic removal of breast: Secondary | ICD-10-CM | POA: Diagnosis not present

## 2020-04-08 DIAGNOSIS — Z8614 Personal history of Methicillin resistant Staphylococcus aureus infection: Secondary | ICD-10-CM | POA: Diagnosis not present

## 2020-04-08 DIAGNOSIS — Z170 Estrogen receptor positive status [ER+]: Secondary | ICD-10-CM | POA: Diagnosis not present

## 2020-04-08 DIAGNOSIS — Z8249 Family history of ischemic heart disease and other diseases of the circulatory system: Secondary | ICD-10-CM | POA: Diagnosis not present

## 2020-04-08 DIAGNOSIS — C50919 Malignant neoplasm of unspecified site of unspecified female breast: Secondary | ICD-10-CM | POA: Diagnosis present

## 2020-04-08 DIAGNOSIS — Z8615 Personal history of latent tuberculosis infection: Secondary | ICD-10-CM | POA: Insufficient documentation

## 2020-04-08 DIAGNOSIS — Z823 Family history of stroke: Secondary | ICD-10-CM | POA: Diagnosis not present

## 2020-04-08 DIAGNOSIS — E785 Hyperlipidemia, unspecified: Secondary | ICD-10-CM | POA: Insufficient documentation

## 2020-04-08 DIAGNOSIS — Z793 Long term (current) use of hormonal contraceptives: Secondary | ICD-10-CM | POA: Diagnosis not present

## 2020-04-08 DIAGNOSIS — Z8042 Family history of malignant neoplasm of prostate: Secondary | ICD-10-CM | POA: Insufficient documentation

## 2020-04-08 DIAGNOSIS — K219 Gastro-esophageal reflux disease without esophagitis: Secondary | ICD-10-CM | POA: Insufficient documentation

## 2020-04-08 DIAGNOSIS — N6012 Diffuse cystic mastopathy of left breast: Secondary | ICD-10-CM | POA: Diagnosis not present

## 2020-04-08 DIAGNOSIS — Z800 Family history of malignant neoplasm of digestive organs: Secondary | ICD-10-CM | POA: Insufficient documentation

## 2020-04-08 DIAGNOSIS — C50411 Malignant neoplasm of upper-outer quadrant of right female breast: Secondary | ICD-10-CM | POA: Diagnosis not present

## 2020-04-08 DIAGNOSIS — N6011 Diffuse cystic mastopathy of right breast: Secondary | ICD-10-CM | POA: Diagnosis not present

## 2020-04-08 DIAGNOSIS — N6091 Unspecified benign mammary dysplasia of right breast: Secondary | ICD-10-CM | POA: Diagnosis not present

## 2020-04-08 HISTORY — PX: NIPPLE SPARING MASTECTOMY WITH SENTINEL LYMPH NODE BIOPSY: SHX6826

## 2020-04-08 HISTORY — PX: BREAST RECONSTRUCTION WITH PLACEMENT OF TISSUE EXPANDER AND FLEX HD (ACELLULAR HYDRATED DERMIS): SHX6295

## 2020-04-08 LAB — POCT PREGNANCY, URINE: Preg Test, Ur: NEGATIVE

## 2020-04-08 SURGERY — NIPPLE SPARING MASTECTOMY WITH SENTINEL LYMPH NODE BIOPSY
Anesthesia: General | Site: Breast | Laterality: Bilateral

## 2020-04-08 MED ORDER — ONDANSETRON 4 MG PO TBDP: 4.0000 mg | ORAL_TABLET | Freq: Four times a day (QID) | ORAL | Status: DC | PRN

## 2020-04-08 MED ORDER — HYDROMORPHONE HCL 1 MG/ML IJ SOLN
0.2500 mg | INTRAMUSCULAR | Status: DC | PRN
  Administered 2020-04-08: 0.5 mg via INTRAVENOUS

## 2020-04-08 MED ORDER — PHENYLEPHRINE 40 MCG/ML (10ML) SYRINGE FOR IV PUSH (FOR BLOOD PRESSURE SUPPORT)
PREFILLED_SYRINGE | INTRAVENOUS | Status: AC
  Filled 2020-04-08: qty 10

## 2020-04-08 MED ORDER — METOCLOPRAMIDE HCL 5 MG/ML IJ SOLN: 10.0000 mg | Freq: Once | INTRAMUSCULAR | Status: DC | PRN

## 2020-04-08 MED ORDER — GABAPENTIN 300 MG PO CAPS
ORAL_CAPSULE | ORAL | Status: AC
  Filled 2020-04-08: qty 1

## 2020-04-08 MED ORDER — POLYETHYLENE GLYCOL 3350 17 G PO PACK: 17.0000 g | PACK | Freq: Every day | ORAL | Status: DC | PRN

## 2020-04-08 MED ORDER — SCOPOLAMINE 1 MG/3DAYS TD PT72
MEDICATED_PATCH | TRANSDERMAL | Status: AC
  Filled 2020-04-08: qty 1

## 2020-04-08 MED ORDER — CEFAZOLIN SODIUM-DEXTROSE 2-4 GM/100ML-% IV SOLN
2.0000 g | Freq: Three times a day (TID) | INTRAVENOUS | Status: DC
  Administered 2020-04-08 – 2020-04-09 (×2): 2 g via INTRAVENOUS
  Filled 2020-04-08 (×2): qty 100

## 2020-04-08 MED ORDER — ONDANSETRON HCL 4 MG/2ML IJ SOLN
INTRAMUSCULAR | Status: AC
  Filled 2020-04-08: qty 2

## 2020-04-08 MED ORDER — PHENYLEPHRINE 40 MCG/ML (10ML) SYRINGE FOR IV PUSH (FOR BLOOD PRESSURE SUPPORT)
PREFILLED_SYRINGE | INTRAVENOUS | Status: DC | PRN
  Administered 2020-04-08: 80 ug via INTRAVENOUS

## 2020-04-08 MED ORDER — MEPERIDINE HCL 25 MG/ML IJ SOLN: 6.2500 mg | INTRAMUSCULAR | Status: DC | PRN

## 2020-04-08 MED ORDER — GABAPENTIN 300 MG PO CAPS
300.0000 mg | ORAL_CAPSULE | ORAL | Status: AC
  Administered 2020-04-08: 10:00:00 300 mg via ORAL

## 2020-04-08 MED ORDER — SODIUM CHLORIDE 0.9 % IV SOLN
INTRAVENOUS | Status: DC | PRN
  Administered 2020-04-08: 15:00:00 500 mL

## 2020-04-08 MED ORDER — TECHNETIUM TC 99M SULFUR COLLOID FILTERED
1.0000 | Freq: Once | INTRAVENOUS | Status: AC | PRN
  Administered 2020-04-08: 11:00:00 1 via INTRADERMAL

## 2020-04-08 MED ORDER — CHLORHEXIDINE GLUCONATE CLOTH 2 % EX PADS: 6.0000 | MEDICATED_PAD | Freq: Once | CUTANEOUS | Status: DC

## 2020-04-08 MED ORDER — SODIUM CHLORIDE 0.9 % IV SOLN
INTRAVENOUS | Status: AC
  Filled 2020-04-08: qty 500000

## 2020-04-08 MED ORDER — KCL IN DEXTROSE-NACL 20-5-0.45 MEQ/L-%-% IV SOLN
INTRAVENOUS | Status: DC
  Filled 2020-04-08 (×2): qty 1000

## 2020-04-08 MED ORDER — LIDOCAINE 2% (20 MG/ML) 5 ML SYRINGE
INTRAMUSCULAR | Status: AC
  Filled 2020-04-08: qty 5

## 2020-04-08 MED ORDER — HYDROMORPHONE HCL 1 MG/ML IJ SOLN
INTRAMUSCULAR | Status: AC
  Filled 2020-04-08: qty 0.5

## 2020-04-08 MED ORDER — LIDOCAINE 2% (20 MG/ML) 5 ML SYRINGE
INTRAMUSCULAR | Status: DC | PRN
  Administered 2020-04-08: 80 mg via INTRAVENOUS

## 2020-04-08 MED ORDER — MIDAZOLAM HCL 2 MG/2ML IJ SOLN
INTRAMUSCULAR | Status: AC
  Filled 2020-04-08: qty 2

## 2020-04-08 MED ORDER — FENTANYL CITRATE (PF) 100 MCG/2ML IJ SOLN
INTRAMUSCULAR | Status: AC
  Filled 2020-04-08: qty 2

## 2020-04-08 MED ORDER — FENTANYL CITRATE (PF) 100 MCG/2ML IJ SOLN
INTRAMUSCULAR | Status: DC | PRN
  Administered 2020-04-08 (×6): 25 ug via INTRAVENOUS

## 2020-04-08 MED ORDER — DIAZEPAM 2 MG PO TABS
2.0000 mg | ORAL_TABLET | Freq: Two times a day (BID) | ORAL | 0 refills | Status: DC | PRN
Start: 2020-04-08 — End: 2020-04-23

## 2020-04-08 MED ORDER — SODIUM CHLORIDE (PF) 0.9 % IJ SOLN
INTRAMUSCULAR | Status: AC
  Filled 2020-04-08: qty 10

## 2020-04-08 MED ORDER — BUPIVACAINE LIPOSOME 1.3 % IJ SUSP
INTRAMUSCULAR | Status: DC | PRN
  Administered 2020-04-08: 10 mL via PERINEURAL

## 2020-04-08 MED ORDER — LACTATED RINGERS IV SOLN: INTRAVENOUS | Status: DC

## 2020-04-08 MED ORDER — ACETAMINOPHEN 500 MG PO TABS
1000.0000 mg | ORAL_TABLET | ORAL | Status: AC
  Administered 2020-04-08: 1000 mg via ORAL

## 2020-04-08 MED ORDER — CEFAZOLIN SODIUM-DEXTROSE 2-4 GM/100ML-% IV SOLN: 2.0000 g | INTRAVENOUS | Status: AC

## 2020-04-08 MED ORDER — ONDANSETRON HCL 4 MG/2ML IJ SOLN: 4.0000 mg | Freq: Four times a day (QID) | INTRAMUSCULAR | Status: DC | PRN

## 2020-04-08 MED ORDER — PROPOFOL 500 MG/50ML IV EMUL
INTRAVENOUS | Status: AC
  Filled 2020-04-08: qty 50

## 2020-04-08 MED ORDER — PROPOFOL 10 MG/ML IV BOLUS
INTRAVENOUS | Status: DC | PRN
  Administered 2020-04-08: 160 mg via INTRAVENOUS

## 2020-04-08 MED ORDER — DIAZEPAM 2 MG PO TABS
2.0000 mg | ORAL_TABLET | Freq: Two times a day (BID) | ORAL | Status: DC | PRN
  Administered 2020-04-08: 2 mg via ORAL
  Filled 2020-04-08: qty 1

## 2020-04-08 MED ORDER — HYDROMORPHONE HCL 1 MG/ML IJ SOLN: 1.0000 mg | INTRAMUSCULAR | Status: DC | PRN

## 2020-04-08 MED ORDER — ACETAMINOPHEN 325 MG PO TABS
325.0000 mg | ORAL_TABLET | Freq: Four times a day (QID) | ORAL | Status: DC
  Administered 2020-04-08: 18:00:00 325 mg via ORAL
  Filled 2020-04-08: qty 1

## 2020-04-08 MED ORDER — DEXAMETHASONE SODIUM PHOSPHATE 10 MG/ML IJ SOLN
INTRAMUSCULAR | Status: AC
  Filled 2020-04-08: qty 1

## 2020-04-08 MED ORDER — METOPROLOL TARTRATE 5 MG/5ML IV SOLN
INTRAVENOUS | Status: AC
  Filled 2020-04-08: qty 5

## 2020-04-08 MED ORDER — LIDOCAINE-EPINEPHRINE 1 %-1:100000 IJ SOLN
INTRAMUSCULAR | Status: AC
  Filled 2020-04-08: qty 1

## 2020-04-08 MED ORDER — DEXAMETHASONE SODIUM PHOSPHATE 4 MG/ML IJ SOLN
INTRAMUSCULAR | Status: DC | PRN
  Administered 2020-04-08: 10 mg via INTRAVENOUS

## 2020-04-08 MED ORDER — HYDROCODONE-ACETAMINOPHEN 5-325 MG PO TABS: 1.5000 | ORAL_TABLET | Freq: Once | ORAL | Status: DC | PRN

## 2020-04-08 MED ORDER — HYDROCODONE-ACETAMINOPHEN 5-325 MG PO TABS
1.0000 | ORAL_TABLET | Freq: Four times a day (QID) | ORAL | Status: DC | PRN
  Administered 2020-04-08 – 2020-04-09 (×2): 1 via ORAL
  Filled 2020-04-08 (×2): qty 1

## 2020-04-08 MED ORDER — ONDANSETRON HCL 4 MG/2ML IJ SOLN
INTRAMUSCULAR | Status: DC | PRN
  Administered 2020-04-08: 4 mg via INTRAVENOUS

## 2020-04-08 MED ORDER — CEFAZOLIN SODIUM 1 G IJ SOLR
INTRAMUSCULAR | Status: AC
  Filled 2020-04-08: qty 20

## 2020-04-08 MED ORDER — FENTANYL CITRATE (PF) 100 MCG/2ML IJ SOLN
50.0000 ug | INTRAMUSCULAR | Status: DC | PRN
  Administered 2020-04-08: 50 ug via INTRAVENOUS

## 2020-04-08 MED ORDER — BISACODYL 10 MG RE SUPP: 10.0000 mg | Freq: Every day | RECTAL | Status: DC | PRN

## 2020-04-08 MED ORDER — DIPHENHYDRAMINE HCL 12.5 MG/5ML PO ELIX: 12.5000 mg | ORAL_SOLUTION | Freq: Four times a day (QID) | ORAL | Status: DC | PRN

## 2020-04-08 MED ORDER — IBUPROFEN 200 MG PO TABS
400.0000 mg | ORAL_TABLET | Freq: Four times a day (QID) | ORAL | Status: DC
  Administered 2020-04-08 – 2020-04-09 (×2): 400 mg via ORAL
  Filled 2020-04-08 (×2): qty 2

## 2020-04-08 MED ORDER — SENNA 8.6 MG PO TABS
1.0000 | ORAL_TABLET | Freq: Two times a day (BID) | ORAL | Status: DC
  Administered 2020-04-08: 23:00:00 8.6 mg via ORAL
  Filled 2020-04-08: qty 1

## 2020-04-08 MED ORDER — BUPIVACAINE HCL (PF) 0.25 % IJ SOLN
INTRAMUSCULAR | Status: AC
  Filled 2020-04-08: qty 30

## 2020-04-08 MED ORDER — SCOPOLAMINE 1 MG/3DAYS TD PT72
1.0000 | MEDICATED_PATCH | TRANSDERMAL | Status: DC
  Administered 2020-04-08: 11:00:00 1.5 mg via TRANSDERMAL

## 2020-04-08 MED ORDER — CELECOXIB 200 MG PO CAPS
ORAL_CAPSULE | ORAL | Status: AC
  Filled 2020-04-08: qty 1

## 2020-04-08 MED ORDER — PROPOFOL 500 MG/50ML IV EMUL
INTRAVENOUS | Status: DC | PRN
Start: 2020-04-08 — End: 2020-04-08
  Administered 2020-04-08: 25 ug/kg/min via INTRAVENOUS

## 2020-04-08 MED ORDER — EPHEDRINE SULFATE 50 MG/ML IJ SOLN
INTRAMUSCULAR | Status: DC | PRN
  Administered 2020-04-08 (×3): 10 mg via INTRAVENOUS

## 2020-04-08 MED ORDER — PROPOFOL 10 MG/ML IV BOLUS
INTRAVENOUS | Status: AC
  Filled 2020-04-08: qty 20

## 2020-04-08 MED ORDER — MIDAZOLAM HCL 2 MG/2ML IJ SOLN
1.0000 mg | INTRAMUSCULAR | Status: DC | PRN
  Administered 2020-04-08: 11:00:00 2 mg via INTRAVENOUS

## 2020-04-08 MED ORDER — ACETAMINOPHEN 500 MG PO TABS
ORAL_TABLET | ORAL | Status: AC
  Filled 2020-04-08: qty 2

## 2020-04-08 MED ORDER — CELECOXIB 200 MG PO CAPS
200.0000 mg | ORAL_CAPSULE | ORAL | Status: AC
  Administered 2020-04-08: 10:00:00 200 mg via ORAL

## 2020-04-08 MED ORDER — CEFAZOLIN SODIUM-DEXTROSE 2-4 GM/100ML-% IV SOLN
INTRAVENOUS | Status: AC
  Filled 2020-04-08: qty 100

## 2020-04-08 MED ORDER — DIPHENHYDRAMINE HCL 50 MG/ML IJ SOLN: 12.5000 mg | Freq: Four times a day (QID) | INTRAMUSCULAR | Status: DC | PRN

## 2020-04-08 MED ORDER — BUPIVACAINE HCL 0.25 % IJ SOLN
INTRAMUSCULAR | Status: DC | PRN
  Administered 2020-04-08: 25 mL

## 2020-04-08 MED ORDER — CEFAZOLIN SODIUM-DEXTROSE 2-4 GM/100ML-% IV SOLN
2.0000 g | INTRAVENOUS | Status: AC
  Administered 2020-04-08 (×2): 2 g via INTRAVENOUS

## 2020-04-08 MED ORDER — BUPIVACAINE HCL (PF) 0.25 % IJ SOLN
INTRAMUSCULAR | Status: DC | PRN
  Administered 2020-04-08: 13 mL

## 2020-04-08 MED FILL — diazePAM 2 MG TABS: 2 | 20 days supply | Qty: 40 | Fill #0

## 2020-04-08 SURGICAL SUPPLY — 94 items
ADH SKN CLS APL DERMABOND .7 (GAUZE/BANDAGES/DRESSINGS) ×2
APL PRP STRL LF DISP 70% ISPRP (MISCELLANEOUS) ×1
APPLIER CLIP 9.375 MED OPEN (MISCELLANEOUS) ×3
APR CLP MED 9.3 20 MLT OPN (MISCELLANEOUS) ×1
BAG DECANTER FOR FLEXI CONT (MISCELLANEOUS) ×3 IMPLANT
BINDER BREAST LRG (GAUZE/BANDAGES/DRESSINGS) IMPLANT
BINDER BREAST MEDIUM (GAUZE/BANDAGES/DRESSINGS) IMPLANT
BINDER BREAST XLRG (GAUZE/BANDAGES/DRESSINGS) ×2 IMPLANT
BINDER BREAST XXLRG (GAUZE/BANDAGES/DRESSINGS) IMPLANT
BIOPATCH RED 1 DISK 7.0 (GAUZE/BANDAGES/DRESSINGS) ×3 IMPLANT
BIOPATCH RED 1IN DISK 7.0MM (GAUZE/BANDAGES/DRESSINGS) ×2
BLADE HEX COATED 2.75 (ELECTRODE) ×3 IMPLANT
BLADE SURG 10 STRL SS (BLADE) ×3 IMPLANT
BLADE SURG 15 STRL LF DISP TIS (BLADE) ×1 IMPLANT
BLADE SURG 15 STRL SS (BLADE) ×3
BNDG GAUZE ELAST 4 BULKY (GAUZE/BANDAGES/DRESSINGS) ×2 IMPLANT
CANISTER SUCT 1200ML W/VALVE (MISCELLANEOUS) ×3 IMPLANT
CHLORAPREP W/TINT 26 (MISCELLANEOUS) ×3 IMPLANT
CLIP APPLIE 9.375 MED OPEN (MISCELLANEOUS) ×1 IMPLANT
COVER BACK TABLE 60X90IN (DRAPES) ×3 IMPLANT
COVER MAYO STAND STRL (DRAPES) ×5 IMPLANT
COVER PROBE W GEL 5X96 (DRAPES) ×2 IMPLANT
COVER SURGICAL LIGHT HANDLE (MISCELLANEOUS) ×2 IMPLANT
COVER WAND RF STERILE (DRAPES) IMPLANT
DECANTER SPIKE VIAL GLASS SM (MISCELLANEOUS) ×2 IMPLANT
DERMABOND ADVANCED (GAUZE/BANDAGES/DRESSINGS) ×4
DERMABOND ADVANCED .7 DNX12 (GAUZE/BANDAGES/DRESSINGS) ×1 IMPLANT
DEVICE DISSECT PLASMABLAD 3.0S (MISCELLANEOUS) ×1 IMPLANT
DRAIN CHANNEL 19F RND (DRAIN) ×5 IMPLANT
DRAPE LAPAROSCOPIC ABDOMINAL (DRAPES) ×3 IMPLANT
DRAPE UTILITY XL STRL (DRAPES) ×3 IMPLANT
DRSG PAD ABDOMINAL 8X10 ST (GAUZE/BANDAGES/DRESSINGS) ×6 IMPLANT
ELECT BLADE 4.0 EZ CLEAN MEGAD (MISCELLANEOUS) ×3
ELECT COATED BLADE 2.86 ST (ELECTRODE) IMPLANT
ELECT REM PT RETURN 9FT ADLT (ELECTROSURGICAL) ×3
ELECTRODE BLDE 4.0 EZ CLN MEGD (MISCELLANEOUS) ×1 IMPLANT
ELECTRODE REM PT RTRN 9FT ADLT (ELECTROSURGICAL) ×1 IMPLANT
EVACUATOR SILICONE 100CC (DRAIN) ×5 IMPLANT
GAUZE SPONGE 4X4 12PLY STRL LF (GAUZE/BANDAGES/DRESSINGS) ×1 IMPLANT
GLOVE BIO SURGEON STRL SZ 6.5 (GLOVE) ×5 IMPLANT
GLOVE BIO SURGEON STRL SZ7.5 (GLOVE) ×3 IMPLANT
GLOVE BIO SURGEONS STRL SZ 6.5 (GLOVE) ×3
GLOVE BIOGEL PI IND STRL 6.5 (GLOVE) IMPLANT
GLOVE BIOGEL PI IND STRL 7.0 (GLOVE) IMPLANT
GLOVE BIOGEL PI INDICATOR 6.5 (GLOVE) ×2
GLOVE BIOGEL PI INDICATOR 7.0 (GLOVE) ×2
GLOVE ECLIPSE 6.5 STRL STRAW (GLOVE) ×2 IMPLANT
GOWN STRL REUS W/ TWL LRG LVL3 (GOWN DISPOSABLE) ×3 IMPLANT
GOWN STRL REUS W/TWL LRG LVL3 (GOWN DISPOSABLE) ×15
GRAFT FLEX HD 6X16 PLIABLE (Tissue) ×4 IMPLANT
ILLUMINATOR WAVEGUIDE N/F (MISCELLANEOUS) IMPLANT
IMPL EXPANDER BREAST 535CC (Breast) IMPLANT
IMPLANT BREAST 535CC (Breast) ×4 IMPLANT
IMPLANT EXPANDER BREAST 535CC (Breast) ×2 IMPLANT
IV NS 1000ML (IV SOLUTION) ×3
IV NS 1000ML BAXH (IV SOLUTION) IMPLANT
IV NS 500ML (IV SOLUTION)
IV NS 500ML BAXH (IV SOLUTION) ×1 IMPLANT
KIT FILL SYSTEM UNIVERSAL (SET/KITS/TRAYS/PACK) IMPLANT
KIT MARKER MARGIN INK (KITS) ×4 IMPLANT
LIGHT WAVEGUIDE WIDE FLAT (MISCELLANEOUS) ×2 IMPLANT
NDL HYPO 25X1 1.5 SAFETY (NEEDLE) ×1 IMPLANT
NEEDLE HYPO 25X1 1.5 SAFETY (NEEDLE) ×3 IMPLANT
NS IRRIG 1000ML POUR BTL (IV SOLUTION) ×3 IMPLANT
PENCIL SMOKE EVACUATOR (MISCELLANEOUS) ×3 IMPLANT
PIN SAFETY STERILE (MISCELLANEOUS) ×3 IMPLANT
PLASMABLADE 3.0S (MISCELLANEOUS) ×3
SET ASEPTIC TRANSFER (MISCELLANEOUS) ×4 IMPLANT
SET BASIN DAY SURGERY F.S. (CUSTOM PROCEDURE TRAY) ×3 IMPLANT
SLEEVE SCD COMPRESS KNEE MED (MISCELLANEOUS) ×3 IMPLANT
SPONGE LAP 18X18 RF (DISPOSABLE) ×8 IMPLANT
STRIP SUTURE WOUND CLOSURE 1/2 (MISCELLANEOUS) IMPLANT
SUT ETHILON 2 0 FS 18 (SUTURE) ×1 IMPLANT
SUT MNCRL AB 4-0 PS2 18 (SUTURE) ×5 IMPLANT
SUT MON AB 3-0 SH 27 (SUTURE) ×6
SUT MON AB 3-0 SH27 (SUTURE) ×1 IMPLANT
SUT MON AB 5-0 PS2 18 (SUTURE) ×5 IMPLANT
SUT PDS 3-0 CT2 (SUTURE)
SUT PDS AB 2-0 CT2 27 (SUTURE) ×14 IMPLANT
SUT PDS II 3-0 CT2 27 ABS (SUTURE) IMPLANT
SUT SILK 2 0 SH (SUTURE) IMPLANT
SUT SILK 3 0 PS 1 (SUTURE) ×5 IMPLANT
SUT VIC AB 3-0 SH 27 (SUTURE)
SUT VIC AB 3-0 SH 27X BRD (SUTURE) IMPLANT
SUT VICRYL 3-0 CR8 SH (SUTURE) ×3 IMPLANT
SYR BULB IRRIGATION 50ML (SYRINGE) ×3 IMPLANT
SYR CONTROL 10ML LL (SYRINGE) ×3 IMPLANT
TOWEL GREEN STERILE FF (TOWEL DISPOSABLE) ×6 IMPLANT
TRAY DSU PREP LF (CUSTOM PROCEDURE TRAY) ×1 IMPLANT
TRAY FOLEY W/BAG SLVR 14FR LF (SET/KITS/TRAYS/PACK) ×2 IMPLANT
TUBE CONNECTING 20'X1/4 (TUBING) ×1
TUBE CONNECTING 20X1/4 (TUBING) ×2 IMPLANT
UNDERPAD 30X36 HEAVY ABSORB (UNDERPADS AND DIAPERS) ×6 IMPLANT
YANKAUER SUCT BULB TIP NO VENT (SUCTIONS) ×3 IMPLANT

## 2020-04-08 NOTE — Interval H&P Note (Signed)
History and Physical Interval Note:  04/08/2020 12:20 PM  Megan [name]  has presented today for surgery, with the diagnosis of RIGHT BREAST DUCTAL CARCINOMA IN SITU.  The various methods of treatment have been discussed with the patient and family. After consideration of risks, benefits and other options for treatment, the patient has consented to  Procedure(s): RIGHT NIPPLE SPARING MASTECTOMY WITH RIGHT SENTINEL LYMPH NODE BIOPSY, LEFT PROPHYLACTIC MASTECTOMY (Bilateral) BILATERAL BREAST RECONSTRUCTION WITH PLACEMENT OF TISSUE EXPANDER AND FLEX HD (ACELLULAR HYDRATED DERMIS) (Bilateral) as a surgical intervention.  The patient's history has been reviewed, patient examined, no change in status, stable for surgery.  I have reviewed the patient's chart and labs.  Questions were answered to the patient's satisfaction.     Loel Lofty Meriem Lemieux

## 2020-04-08 NOTE — Op Note (Signed)
Op report    DATE OF OPERATION:  04/08/2020  LOCATION: [place]  SURGICAL DIVISION: Plastic Surgery  PREOPERATIVE DIAGNOSES:  1. Breast cancer.    POSTOPERATIVE DIAGNOSES:  1. Breast cancer.   PROCEDURE:  1. Bilateral immediate breast reconstruction with placement of Acellular Dermal Matrix and tissue expanders.  SURGEON: Xavius Spadafore Sanger Neola Worrall, DO  ASSISTANT: Roetta Sessions, PA  ANESTHESIA:  General.   COMPLICATIONS: None.   IMPLANTS: Left - Mentor 535 cc. Ref [id number].  Serial Number [id number], 200 cc of injectable saline placed in the expander. Right - Mentor 535 cc. Ref [id number].  Serial Number [id number], 200 cc of injectable saline placed in the expander. Acellular Dermal Matrix 6 x 16 cm  INDICATIONS FOR PROCEDURE:  The patient, Megan [name], is a 49 y.o. female born on 1971-12-07, is here for  immediate first stage breast reconstruction with placement of bilateral tissue expander and Acellular dermal matrix. MRN: [id number]  CONSENT:  Informed consent was obtained directly from the patient. Risks, benefits and alternatives were fully discussed. Specific risks including but not limited to bleeding, infection, hematoma, seroma, scarring, pain, implant infection, implant extrusion, capsular contracture, asymmetry, wound healing problems, and need for further surgery were all discussed. The patient did have an ample opportunity to have her questions answered to her satisfaction.   DESCRIPTION OF PROCEDURE:  The patient was taken to the operating room by the general surgery team. SCDs were placed and IV antibiotics were given. The patient's chest was prepped and draped in a sterile fashion. A time out was performed and the implants to be used were identified.  Bilateral mastectomies were performed.  Once the general surgery team had completed their portion of the case the patient was rendered to the plastic and reconstructive surgery  team.  Right:  The pectoralis major muscle was lifted from the chest wall with release of the lateral edge and lateral inframammary fold.  The pocket was irrigated with antibiotic solution and hemostasis was achieved with electrocautery.  The ADM was then prepared according to the manufacture guidelines and slits placed to help with postoperative fluid management.  The ADM was then sutured to the inferior and lateral edge of the inframammary fold with 2-0 PDS starting with an interrupted stitch and then a running stitch.  The lateral portion was sutured to with interrupted sutures after the expander was placed.  The expander was prepared according to the manufacture guidelines, the air evacuated and then it was placed under the ADM and pectoralis major muscle.  The inferior and lateral tabs were used to secure the expander to the chest wall with 2-0 PDS.  The drain was placed at the inframammary fold over the ADM and secured to the skin with 3-0 Silk.  The deep layers were closed with 3-0 Monocryl followed by 4-0 Monocryl.  The skin was closed with 5-0 Monocryl and then dermabond was applied.    Left: The pectoralis major muscle was lifted from the chest wall with release of the lateral edge and lateral inframammary fold.  The pocket was irrigated with antibiotic solution and hemostasis was achieved with electrocautery.  The ADM was then prepared according to the manufacture guidelines and slits placed to help with postoperative fluid management.  The ADM was then sutured to the inferior and lateral edge of the inframammary fold with 2-0 PDS starting with an interrupted stitch and then a running stitch.  The lateral portion was sutured to with interrupted sutures after the expander was  placed.  The expander was prepared according to the manufacture guidelines, the air evacuated and then it was placed under the ADM and pectoralis major muscle.  The inferior and lateral tabs were used to secure the expander to the  chest wall with 2-0 PDS.  The drain was placed at the inframammary fold over the ADM and secured to the skin with 3-0 Silk.    The deep layers were closed with 3-0 Monocryl followed by 4-0 Monocryl.  The skin was closed with 5-0 Monocryl and then dermabond was applied.  The ABDs and breast binder were placed.  The patient tolerated the procedure well and there were no complications.  The patient was allowed to wake from anesthesia and taken to the recovery room in satisfactory condition.   The advanced practice practitioner (APP) assisted throughout the case.  The APP was essential in retraction and counter traction when needed to make the case progress smoothly.  This retraction and assistance made it possible to see the tissue plans for the procedure.  The assistance was needed for blood control, tissue re-approximation and assisted with closure of the incision site.

## 2020-04-08 NOTE — Addendum Note (Signed)
Addended by: Wallace Going on: 04/08/2020 03:44 PM   Modules accepted: Orders

## 2020-04-08 NOTE — Anesthesia Procedure Notes (Signed)
Procedure Name: LMA Insertion Date/Time: 04/08/2020 11:33 AM Performed by: Marrianne Mood, CRNA Pre-anesthesia Checklist: Patient identified, Emergency Drugs available, Suction available and Patient being monitored Patient Re-evaluated:Patient Re-evaluated prior to induction Oxygen Delivery Method: Circle system utilized Preoxygenation: Pre-oxygenation with 100% oxygen Induction Type: IV induction Ventilation: Mask ventilation without difficulty LMA: LMA inserted LMA Size: 4.0 Number of attempts: 1 Airway Equipment and Method: Bite block Placement Confirmation: positive ETCO2 Tube secured with: Tape Dental Injury: Teeth and Oropharynx as per pre-operative assessment

## 2020-04-08 NOTE — Transfer of Care (Signed)
Immediate Anesthesia Transfer of Care Note  Patient: Megan [name]  Procedure(s) Performed: RIGHT NIPPLE SPARING MASTECTOMY WITH RIGHT SENTINEL LYMPH NODE BIOPSY, LEFT PROPHYLACTIC MASTECTOMY (Bilateral Breast) BILATERAL BREAST RECONSTRUCTION WITH PLACEMENT OF TISSUE EXPANDER AND FLEX HD (ACELLULAR HYDRATED DERMIS) (Bilateral Breast)  Patient Location: PACU  Anesthesia Type:General  Level of Consciousness: drowsy and patient cooperative  Airway & Oxygen Therapy: Patient Spontanous Breathing and Patient connected to face mask oxygen  Post-op Assessment: Report given to RN and Post -op Vital signs reviewed and stable  Post vital signs: Reviewed and stable  Last Vitals:  Vitals Value Taken Time  BP 116/69 04/08/20 1524  Temp    Pulse 75 04/08/20 1525  Resp 16 04/08/20 1525  SpO2 99 % 04/08/20 1525  Vitals shown include unvalidated device data.  Last Pain:  Vitals:   04/08/20 0957  TempSrc: Temporal  PainSc: 0-No pain      Patients Stated Pain Goal: 4 ([id number])  Complications: No apparent anesthesia complications

## 2020-04-08 NOTE — Op Note (Signed)
04/08/2020  2:25 PM  PATIENT:  Megan [name]  49 y.o. female  PRE-OPERATIVE DIAGNOSIS:  RIGHT BREAST DUCTAL CARCINOMA IN SITU  POST-OPERATIVE DIAGNOSIS:  RIGHT BREAST DUCTAL CARCINOMA IN SITU  PROCEDURE:  Procedure(s): RIGHT NIPPLE SPARING MASTECTOMY WITH DEEP RIGHT AXILLARY SENTINEL LYMPH NODE BIOPSY, LEFT PROPHYLACTIC NIPPLE SPARING MASTECTOMY (Bilateral)  SURGEON:  Surgeon(s) and Role: Panel 1:    * Jovita Kussmaul, MD - Primary Panel 2:    * Dillingham, Loel Lofty, DO - Primary  PHYSICIAN ASSISTANT:   ASSISTANTS: none   ANESTHESIA:   local and general  EBL:  85 mL   BLOOD ADMINISTERED:none  DRAINS: none   LOCAL MEDICATIONS USED:  MARCAINE     SPECIMEN:  Source of Specimen:  Left mastectomy, right mastectomy with additional anterior-superior margin and sentinel node  DISPOSITION OF SPECIMEN:  PATHOLOGY  COUNTS:  YES  TOURNIQUET:  * No tourniquets in log *  DICTATION: .Dragon Dictation   After informed consent was obtained the patient was brought to the operating room and placed in the supine position on the operating table.  After adequate induction of general anesthesia the patient's bilateral chest, breast, and axillary areas were prepped with ChloraPrep, allowed to dry, and draped in usual sterile manner.  An appropriate timeout was performed.  Earlier in the day the patient underwent injection of 1 mCi of technetium sulfur colloid in the subareolar position on the right.  Attention was first turned to the left breast.  The inframammary fold was infiltrated with quarter percent Marcaine.  An inframammary fold incision was then made with a 15 blade knife.  The incision was carried through the skin and subcutaneous tissue sharply with the PlasmaBlade.  The dissection was carried all the way to the chest wall.  Next the dissection was carried superiorly between the breast tissue and the pectoralis muscle until the entire posterior surface of the breast has been separated  from the chest wall.  Dissection was then carried anteriorly between the breast tissue and the subcutaneous fat.  This was done by combination of blunt hemostat dissection and sharp dissection with the PlasmaBlade.  Once the anterior and posterior dissections met along the medial superior and lateral breast then the breast was able to be removed in its entirety from the patient.  The nipple was marked with a stitch.  A shaving of breast tissue was taken from behind the nipple and sent for frozen section which was negative.  The wound was then irrigated with saline and packed with a moistened lap sponge.  The area appeared to be hemostatic.  The skin appeared to be viable and healthy.  Attention was then turned to the right breast.  The inframammary fold was infiltrated with quarter percent Marcaine.  An inframammary fold incision was then made with a 15 blade knife.  The incision was carried through the skin and subcutaneous tissue sharply with the PlasmaBlade until the dissection reached the chest wall.  Dissection was then carried superiorly between the breast tissue and the pectoralis muscle until the entire posterior surface of the breast was freed from the chest wall.  Dissection was then carried anteriorly between the breast tissue and the subcutaneous fat by combination of blunt hemostat dissection and sharp dissection with the PlasmaBlade.  Once the anterior dissection met the posterior dissection along the medial superior and lateral surface of the breast then the entire breast was able to be removed.  The nipple was marked with a stitch.  A  shaving of tissue from behind the nipple was sent to pathology for frozen section and was negative.  Both breasts were marked with the appropriate paint colors and then sent to pathology for further evaluation.  The neoprobe was then used to direct blunt hemostat dissection into the deep right axillary space.  I was able to identify a hot lymph node that was removed  sharply with the PlasmaBlade and the surrounding lymphatics and small vessels were controlled with clips.  Ex vivo counts on this node was approximately 850.  No other hot or palpable nodes were identified in the right axilla.  The area appeared to be hemostatic.  The wound was irrigated with saline and packed with a moistened lap sponge.  At this point the operation was turned over to Dr. Marla Roe for the reconstruction.  All needle sponge and instrument counts were correct.  The patient was in stable condition.  Her portion of the operation will be dictated separately.  PLAN OF CARE: Admit for overnight observation  PATIENT DISPOSITION:  PACU - hemodynamically stable.   Delay start of Pharmacological VTE agent (>24hrs) due to surgical blood loss or risk of bleeding: no

## 2020-04-08 NOTE — Discharge Instructions (Signed)
INSTRUCTIONS FOR AFTER BREAST SURGERY   You are getting ready to undergo breast surgery.  You will likely have some questions about what to expect following your operation.  The following information will help you and your family understand what to expect when you are discharged from the hospital.  Following these guidelines will help ensure a smooth recovery and reduce risks of complications.   Postoperative instructions include information on: diet, wound care, medications and physical activity.  AFTER SURGERY Expect to go home after the procedure.  In some cases, you may need to spend one night in the hospital for observation.  DIET Breast surgery does not require a specific diet.  However, the healthier you eat the better your body can start healing. It is important to increasing your protein intake.  This means limiting the foods with sugar and carbohydrates.  Focus on vegetables and some meat.  If you have any liposuction during your procedure be sure to drink water.  If your urine is bright yellow, then it is concentrated, and you need to drink more water.  As a general rule after surgery, you should have 8 ounces of water every hour while awake.  If you find you are persistently nauseated or unable to take in liquids let us know.  NO TOBACCO USE or EXPOSURE.  This will slow your healing process and increase the risk of a wound.  WOUND CARE If you don't have a drain:  You can shower the day after surgery. Use fragrance free soap.  Dial, [place] and [place] are usually mild on the skin. If you have a drain: You can shower five days after surgery.  Clean with baby wipes until the drain is removed.    If you have steri-strips / tape directly attached to your skin leave them in place. It is OK to get these wet.  No baths, pools or hot tubs for two weeks. We close your incision to leave the smallest and best-looking scar. No ointment or creams on your incisions until given the go ahead.  Especially not  Neosporin (Too many skin reactions with this one).  A few weeks after surgery you can use Mederma and start massaging the scar. We ask you to wear your binder or sports bra for the first 6 weeks around the clock, including while sleeping. This provides added comfort and helps reduce the fluid accumulation at the surgery site.  ACTIVITY No heavy lifting until cleared by the doctor.  This usually means no more than a half-gallon of milk.  It is OK to walk and climb stairs. In fact, moving your legs is very important to decrease your risk of a blood clot.  It will also help keep you from getting deconditioned.  Every 1 to 2 hours get up and walk for 5 minutes. This will help with a quicker recovery back to normal.  Let pain be your guide so you don't do too much.  This is not the time for spring cleaning and don't plan on taking care of anyone else.  This time is for you to recover,  You will be more comfortable if you sleep and rest with your head elevated either with a few pillows under you or in a recliner.  No stomach sleeping for a three months.  WORK Everyone returns to work at different times. As a rough guide, most people take at least 1 - 2 weeks off prior to returning to work. If you need documentation for your job,  bring the forms to your postoperative follow up visit.  DRIVING Arrange for someone to bring you home from the hospital.  You may be able to drive a few days after surgery but not while taking any narcotics or valium.  BOWEL MOVEMENTS Constipation can occur after anesthesia and while taking pain medication.  It is important to stay ahead for your comfort.  We recommend taking Milk of Magnesia (2 tablespoons; twice a day) while taking the pain pills.  SEROMA This is fluid your body tried to put in the surgical site.  This is normal but if it creates tight skinny skin let us know.  It usually decreases in a few weeks.  MEDICATIONS and PAIN CONTROL At your preoperative visit for  you history and physical you were given the following medications: 1. An antibiotic: Start this medication when you get home and take according to the instructions on the bottle. 2. Zofran 4 mg:  This is to treat nausea and vomiting.  You can take this every 6 hours as needed and only if needed. 3. Valium 2 mg: This is for muscle tightness if you have an implant or expander. This will help relax your muscle which also helps with pain control.  This can be taken every 12 hours as needed.  Don't drive after taking this medication. 4. Norco (hydrocodone/acetaminophen) 5/325 mg:  This is only to be used after you have taken the motrin or the tylenol. Every 8 hours as needed. Over the counter Medication to take: 5. Ibuprofen (Motrin) 600 mg:  Take this every 6 hours.  If you have additional pain then take 500 mg of the tylenol every 8 hours.  Only take the Norco after you have tried these two. 6. Miralax or stool softener of choice: Take this according to the bottle if you take the [place] Call your surgeon's office if any of the following occur: . Fever 101 degrees F or greater . Excessive bleeding or fluid from the incision site. . Pain that increases over time without aid from the medications . Redness, warmth, or pus draining from incision sites . Persistent nausea or inability to take in liquids . Severe misshapen area that underwent the operation.  Here are some resources:  1. Plastic surgery website: https://www.plasticsurgery.org/for-medical-professionals/education-and-resources/publications/breast-reconstruction-magazine 2. Breast Reconstruction Awareness Campaign:  HotelLives.co.nz 3. Plastic surgery Implant information:  https://www.plasticsurgery.org/patient-safety/breast-implant-safety

## 2020-04-08 NOTE — Interval H&P Note (Signed)
History and Physical Interval Note:  04/08/2020 10:28 AM  Megan [name]  has presented today for surgery, with the diagnosis of RIGHT BREAST DUCTAL CARCINOMA IN SITU.  The various methods of treatment have been discussed with the patient and family. After consideration of risks, benefits and other options for treatment, the patient has consented to  Procedure(s): RIGHT NIPPLE SPARING MASTECTOMY WITH RIGHT SENTINEL LYMPH NODE BIOPSY, LEFT PROPHYLACTIC MASTECTOMY (Bilateral) BILATERAL BREAST RECONSTRUCTION WITH PLACEMENT OF TISSUE EXPANDER AND FLEX HD (ACELLULAR HYDRATED DERMIS) (Bilateral) as a surgical intervention.  The patient's history has been reviewed, patient examined, no change in status, stable for surgery.  I have reviewed the patient's chart and labs.  Questions were answered to the patient's satisfaction.     Autumn Messing III

## 2020-04-08 NOTE — Anesthesia Preprocedure Evaluation (Signed)
Anesthesia Evaluation  Patient identified by MRN, date of birth, ID band Patient awake    Reviewed: Allergy & Precautions, NPO status , Patient's Chart, lab work & pertinent test results  Airway Mallampati: II  TM Distance: >3 FB Neck ROM: Full    Dental no notable dental hx. (+) Teeth Intact   Pulmonary neg pulmonary ROS,    Pulmonary exam normal breath sounds clear to auscultation       Cardiovascular negative cardio ROS Normal cardiovascular exam Rhythm:Regular Rate:Normal     Neuro/Psych negative neurological ROS  negative psych ROS   GI/Hepatic Neg liver ROS, GERD  Medicated and Controlled,  Endo/Other  Right Breast Ca Hyperlipidemia  Renal/GU negative Renal ROS  negative genitourinary   Musculoskeletal negative musculoskeletal ROS (+)   Abdominal   Peds  Hematology negative hematology ROS (+)   Anesthesia Other Findings   Reproductive/Obstetrics                             Anesthesia Physical Anesthesia Plan  ASA: II  Anesthesia Plan: General   Post-op Pain Management:    Induction: Intravenous  PONV Risk Score and Plan: 4 or greater and Scopolamine patch - Pre-op, Midazolam, Ondansetron, Dexamethasone and Treatment may vary due to age or medical condition  Airway Management Planned: LMA and Oral ETT  Additional Equipment:   Intra-op Plan:   Post-operative Plan: Extubation in OR  Informed Consent: I have reviewed the patients History and Physical, chart, labs and discussed the procedure including the risks, benefits and alternatives for the proposed anesthesia with the patient or authorized representative who has indicated his/her understanding and acceptance.     Dental advisory given  Plan Discussed with: CRNA and Surgeon  Anesthesia Plan Comments:         Anesthesia Quick Evaluation

## 2020-04-08 NOTE — Progress Notes (Signed)
Emotional support during breast injections °

## 2020-04-08 NOTE — Progress Notes (Signed)
Patient feeling "tired and faint". Blood pressure 64/38, with the feeling of pressure in her left chest. Left breast appears significantly larger and more bruised than previous assessment. JP drain had many clots and 60 mL in left drain. MD and PA called to report findings. LR bolus 1000 given per PA Matt. Matt at bedside at the moment massaging breast. Patient appears in less pain and more at ease. Blood pressure is now 109/55, oxygen saturation 98% on room air, and heart rate 62. Will continue to closely monitor patient. Setzer, Marchelle Folks

## 2020-04-08 NOTE — Progress Notes (Signed)
Assisted Dr. Foster with right, ultrasound guided, pectoralis block. Side rails up, monitors on throughout procedure. See vital signs in flow sheet. Tolerated Procedure well. 

## 2020-04-08 NOTE — Anesthesia Postprocedure Evaluation (Signed)
Anesthesia Post Note  Patient: Megan [name]  Procedure(s) Performed: RIGHT NIPPLE SPARING MASTECTOMY WITH RIGHT SENTINEL LYMPH NODE BIOPSY, LEFT PROPHYLACTIC MASTECTOMY (Bilateral Breast) BILATERAL BREAST RECONSTRUCTION WITH PLACEMENT OF TISSUE EXPANDER AND FLEX HD (ACELLULAR HYDRATED DERMIS) (Bilateral Breast)     Patient location during evaluation: PACU Anesthesia Type: General Level of consciousness: awake and alert, oriented and patient cooperative Pain management: pain level controlled Vital Signs Assessment: post-procedure vital signs reviewed and stable Respiratory status: spontaneous breathing, nonlabored ventilation and respiratory function stable Cardiovascular status: blood pressure returned to baseline and stable Postop Assessment: no apparent nausea or vomiting Anesthetic complications: no    Last Vitals:  Vitals:   04/08/20 1524 04/08/20 1530  BP: 116/69 124/75  Pulse: 82 71  Resp: 17 16  Temp: 37.2 C   SpO2: 99% 99%    Last Pain:  Vitals:   04/08/20 1524  TempSrc:   PainSc: [place]

## 2020-04-08 NOTE — Anesthesia Procedure Notes (Signed)
Anesthesia Regional Block: Pectoralis block   Pre-Anesthetic Checklist: ,, timeout performed, Correct Patient, Correct Site, Correct Laterality, Correct Procedure, Correct Position, site marked, Risks and benefits discussed,  Surgical consent,  Pre-op evaluation,  At surgeon's request and post-op pain management  Laterality: Right  Prep: chloraprep       Needles:  Injection technique: Single-shot  Needle Type: Echogenic Stimulator Needle     Needle Length: 9cm  Needle Gauge: 21   Needle insertion depth: 7 cm   Additional Needles:   Procedures:,,,, ultrasound used (permanent image in chart),,,,  Narrative:  Start time: 04/08/2020 10:40 AM End time: 04/08/2020 10:45 AM Injection made incrementally with aspirations every 5 mL.  Performed by: Personally  Anesthesiologist: Josephine Igo, MD  Additional Notes: Timeout performed. Patient sedated. Relevant anatomy ID'd using [place]. Incremental 2-48ml injection of LA with frequent aspiration. Patient tolerated procedure well.        Right Pectoralis Block

## 2020-04-08 NOTE — H&P (Signed)
Megan [name]  Location: [facility] Surgery Patient #: [id number] DOB: 05/16/1971 Married / Language: English / Race: White Female   History of Present Illness The patient is a 49 year [name] female who presents with breast cancer. We are asked to see the patient in consultation by Dr. Isaac Bliss to evaluate her for a new right breast cancer. The patient is a 49 year [name] white female who recently went for a routine screening mammogram. At that time she was found to have 8 mm lesions in the lateral aspect of both breasts. The left side was biopsied and came back as an ill-defined papillary lesion. The right side came back as ductal carcinoma in situ that was ER/PR positive. She is otherwise in good health. She does have a significant family history of breast cancer in her mother at the age of 47. She does not smoke. She does have an IUD that has hormones. She has been counseled to contact her GYN doctor to have this removed.   Past Surgical History  Breast Biopsy  Bilateral. Cesarean Section - 1  Colon Polyp Removal - Colonoscopy  Oral Surgery   Diagnostic Studies History  Colonoscopy  1-5 years ago Mammogram  within last year Pap Smear  1-5 years ago  Allergies  No Known Allergies    Medication History  NexIUM (40MG  Capsule DR, Oral) Active. Flonase Allergy Relief (50MCG/ACT Suspension, Nasal) Active. Medications Reconciled  Social History  No alcohol use  No caffeine use  No drug use  Tobacco use  Never smoker.  Family History Breast Cancer  Mother. Hypertension  Mother. Ischemic Bowel Disease  Father.  Pregnancy / Birth History  Age at menarche  21 years. Contraceptive History  Intrauterine device. Gravida  4 Irregular periods  Length (months) of breastfeeding  3-6 Maternal age  14-30 Para  2  Other Problems  Breast Cancer  Gastroesophageal Reflux Disease     Review of Systems  General Not Present- Appetite Loss,  Chills, Fatigue, Fever, Night Sweats, Weight Gain and Weight Loss. Skin Not Present- Change in Wart/Mole, Dryness, Hives, Jaundice, New Lesions, Non-Healing Wounds, Rash and Ulcer. HEENT Present- Seasonal Allergies. Not Present- Earache, Hearing Loss, Hoarseness, Nose Bleed, Oral Ulcers, Ringing in the Ears, Sinus Pain, Sore Throat, Visual Disturbances, Wears glasses/contact lenses and Yellow Eyes. Respiratory Not Present- Bloody sputum, Chronic Cough, Difficulty Breathing, Snoring and Wheezing. Breast Present- Breast Mass. Not Present- Breast Pain, Nipple Discharge and Skin Changes. Cardiovascular Not Present- Chest Pain, Difficulty Breathing Lying Down, Leg Cramps, Palpitations, Rapid Heart Rate, Shortness of Breath and Swelling of Extremities. Gastrointestinal Not Present- Abdominal Pain, Bloating, Bloody Stool, Change in Bowel Habits, Chronic diarrhea, Constipation, Difficulty Swallowing, Excessive gas, Gets full quickly at meals, Hemorrhoids, Indigestion, Nausea, Rectal Pain and Vomiting. Female Genitourinary Not Present- Frequency, Nocturia, Painful Urination, Pelvic Pain and Urgency. Musculoskeletal Not Present- Back Pain, Joint Pain, Joint Stiffness, Muscle Pain, Muscle Weakness and Swelling of Extremities. Neurological Not Present- Decreased Memory, Fainting, Headaches, Numbness, Seizures, Tingling, Tremor, Trouble walking and Weakness. Psychiatric Not Present- Anxiety, Bipolar, Change in Sleep Pattern, Depression, Fearful and Frequent crying. Endocrine Not Present- Cold Intolerance, Excessive Hunger, Hair Changes, Heat Intolerance, Hot flashes and New Diabetes. Hematology Not Present- Blood Thinners, Easy Bruising, Excessive bleeding, Gland problems, HIV and Persistent Infections.  Vitals Weight: 172.38 lb Height: 65in Body Surface Area: 1.86 m Body Mass Index: 28.68 kg/m  Temp.: 98.6F  Pulse: 97 (Regular)  BP: 120/76(Sitting, Left Arm, Standard)       Physical Exam  General Mental Status-Alert. General Appearance-Consistent with stated age. Hydration-Well hydrated. Voice-Normal.  Head and Neck Head-normocephalic, atraumatic with no lesions or palpable masses. Trachea-midline. Thyroid Gland Characteristics - normal size and consistency.  Eye Eyeball - Bilateral-Extraocular movements intact. Sclera/Conjunctiva - Bilateral-No scleral icterus.  Chest and Lung Exam Chest and lung exam reveals -quiet, even and easy respiratory effort with no use of accessory muscles and on auscultation, normal breath sounds, no adventitious sounds and normal vocal resonance. Inspection Chest Wall - Normal. Back - normal.  Breast Note: There is no palpable mass in either breast. There is no palpable axillary, supraclavicular, or cervical lymphadenopathy. She does have symmetric dense breast tissue bilaterally.   Cardiovascular Cardiovascular examination reveals -normal heart sounds, regular rate and rhythm with no murmurs and normal pedal pulses bilaterally.  Abdomen Inspection Inspection of the abdomen reveals - No Hernias. Skin - Scar - no surgical scars. Palpation/Percussion Palpation and Percussion of the abdomen reveal - Soft, Non Tender, No Rebound tenderness, No Rigidity (guarding) and No hepatosplenomegaly. Auscultation Auscultation of the abdomen reveals - Bowel sounds normal.  Neurologic Neurologic evaluation reveals -alert and oriented x 3 with no impairment of recent or remote memory. Mental Status-Normal.  Musculoskeletal Normal Exam - Left-Upper Extremity Strength Normal and Lower Extremity Strength Normal. Normal Exam - Right-Upper Extremity Strength Normal and Lower Extremity Strength Normal.  Lymphatic Head & Neck  General Head & Neck Lymphatics: Bilateral - Description - Normal. Axillary  General Axillary Region: Bilateral - Description - Normal. Tenderness - Non Tender. Femoral &  Inguinal  Generalized Femoral & Inguinal Lymphatics: Bilateral - Description - Normal. Tenderness - Non Tender.    Assessment & Plan  DUCTAL CARCINOMA IN SITU (DCIS) OF RIGHT BREAST (D05.11) Impression: The patient appears to have an 8 mm area of DCIS in the lateral right breast as well as an ill-defined papillary lesion in the upper outer quadrant of the left breast. I have discussed with her in detail all the different options for treatment and at this point given her family history, young age, and ill-defined lesion in the left breast she is favoring bilateral mastectomies with reconstruction. She would be a candidate for sentinel node mapping on the right. I have discussed with her in detail the risks and benefits of the operation as well as some of the technical aspects and she understands and wishes to proceed. We will go ahead and make her referrals today to plastic surgery to talk about reconstruction. I will also make a referral to medical and radiation oncology to discuss adjuvant therapy. She has had some genetic testing in the last couple years but we will have to review how complete that was and whether she would need a more complete evaluation or not. Otherwise we will continue to receive surgical planning. I will also plan for MRI evaluation of both breasts given the DCIS on the right and the ill-defined papillary lesion on the left. This patient encounter took 60 minutes today to perform the following: take history, perform exam, review outside records, interpret imaging, counsel the patient on their diagnosis and document encounter, findings & plan in the EHR Current Plans MRI, breast, w/o and w contrast material(s), including computer-aided detection (CAD real-time lesion detection, characterization and pharmacokinetic analysis), when performed; bilateral(77049)(ACR 5 )(DSN [id number])(G-Code G1004(ME)) (Clinical Indications: Breast cancer, axillary eval) Referred to Oncology, for  evaluation and follow up (Oncology). Routine. Referred to Surgery - Plastic, for evaluation and follow up (Plastic Surgery). Routine. PAPILLOMA OF LEFT BREAST (D24.2)

## 2020-04-09 ENCOUNTER — Encounter: Payer: Self-pay | Admitting: *Deleted

## 2020-04-09 DIAGNOSIS — D0511 Intraductal carcinoma in situ of right breast: Secondary | ICD-10-CM | POA: Diagnosis not present

## 2020-04-09 MED FILL — OMRON 3 SERIES BP MONITOR D: 30 days supply | Qty: 1 | Fill #0

## 2020-04-09 NOTE — Discharge Summary (Signed)
Physician Discharge Summary  Patient ID: Megan [name] MRN: [id number] DOB/AGE: 1971-11-18 49 y.o.  Admit date: 04/08/2020 Discharge date: 04/09/2020  Admission Diagnoses: Breast Cancer  Discharge Diagnoses:  Active Problems:   Breast cancer [facility])   Discharged Condition: good  Hospital Course: Patient is a 49 year old female who presented to the [facility] day surgery center for right nipple sparing mastectomy with deep right axillary sentinel lymph node biopsy and left prophylactic nipple sparing mastectomy by Dr. Marlou Starks followed by bilateral immediate breast reconstruction with placement of tissue expanders and Flex HD by Dr. Marla Roe on 04/08/2020.  Postoperatively patient did well until approximately 6:30 PM when nursing staff noted patient to have increased drainage from left JP drain and bruising over left breast.  I evaluated patient at 7:15 PM in her room with nursing staff present and was able to express approximately 230 cc of sanguinous drainage from left JP drain.  Left breast immediately was softer and less full, ecchymotic skin significantly improved as well. Patient was given 1 L of LR and blood pressure improved. She reported that she also felt better. Breast binder was reapplied with additional padding for compression and ice was placed in left axilla.  Patient did well the rest of the night, no fevers, chills, nausea, vomiting.  Pain adequately controlled on p.o. medications.  Patient reports that pain during evaluation this a.m. is mild.   Patient's BP this a.m. is slightly low, but she is asymptomatic.  Patient with 25 cc of serosanguineous output from left JP drain this a.m per nursing staff.  Encouraged drinking plenty of fluids while at home, calling if she has new symptoms or has any questions or concerns.   Consults: None  Significant Diagnostic Studies: None  Treatments: IV hydration, antibiotics: Ancef and analgesia: valium and ibuprofen and vicodin  Discharge  Exam: Blood pressure (!) 89/50, pulse 76, temperature 98.4 F (36.9 C), resp. rate 18, height 5\' 6"  (1.676 m), weight 76.3 kg, SpO2 97 %. General appearance: alert, cooperative, no distress and resting in bed Head: Normocephalic, without obvious abnormality, atraumatic Resp: unlabored Breasts: right breast with mastectomy incisions, good NAC color, no bruising or swelling noted. JP drain in place, serosanguinous output.  Left breast with no overt swelling noted, drainage decreased, serosanguinous fluid in bulb, ecchymosis noted over breast, normal NAC color. Mild TTP. Incision bilaterally is c/d/i, no dehiscence noted. Extremities: SCDs in place   Disposition: Discharge disposition: 01-Home or Self Care       Discharge Instructions    Call MD for:  difficulty breathing, headache or visual disturbances   Complete by: As directed    Call MD for:  extreme fatigue   Complete by: As directed    Call MD for:  hives   Complete by: As directed    Call MD for:  persistant dizziness or light-headedness   Complete by: As directed    Call MD for:  persistant nausea and vomiting   Complete by: As directed    Call MD for:  redness, tenderness, or signs of infection (pain, swelling, redness, odor or green/yellow discharge around incision site)   Complete by: As directed    Call MD for:  severe uncontrolled pain   Complete by: As directed    Call MD for:  temperature >100.4   Complete by: As directed    Diet - low sodium heart healthy   Complete by: As directed    Increase activity slowly   Complete by: As directed  Allergies as of 04/09/2020      Reactions   No Known Allergies       Medication List    TAKE these medications   cetirizine 10 MG tablet Commonly known as: ZYRTEC Take 10 mg by mouth daily.   diazepam 2 MG tablet Commonly known as: Valium Take 1 tablet (2 mg total) by mouth every 12 (twelve) hours as needed for up to 20 days for muscle spasms.   esomeprazole 40  MG capsule Commonly known as: NexIUM Take 1 capsule (40 mg total) by mouth daily.   fluticasone 50 MCG/ACT nasal spray Commonly known as: FLONASE Place 2 sprays into both nostrils daily.   levonorgestrel 20 MCG/24HR IUD Commonly known as: MIRENA 1 each by Intrauterine route once.   ondansetron 4 MG tablet Commonly known as: Zofran Take 1 tablet (4 mg total) by mouth every 8 (eight) hours as needed for nausea or vomiting.      Follow-up Information    Dillingham, Loel Lofty, DO In 1 week.   Specialty: Plastic Surgery Contact information: [address] [place] [address] [phone]        Autumn Messing III, MD In 2 weeks.   Specialty: General Surgery Contact information: [place] [id number] [facility] [address] [place], [place] [address] [phone]  Signed: Carola Rhine Noelle Sease 04/09/2020, 8:07 AM

## 2020-04-11 LAB — SURGICAL PATHOLOGY

## 2020-04-16 ENCOUNTER — Encounter: Payer: Self-pay | Admitting: Plastic Surgery

## 2020-04-16 ENCOUNTER — Other Ambulatory Visit: Payer: Self-pay

## 2020-04-16 ENCOUNTER — Encounter: Payer: Self-pay | Admitting: *Deleted

## 2020-04-16 ENCOUNTER — Ambulatory Visit (INDEPENDENT_AMBULATORY_CARE_PROVIDER_SITE_OTHER): Payer: 59 | Admitting: Plastic Surgery

## 2020-04-16 VITALS — BP 108/73 | HR 86 | Temp 97.3°F | Ht 65.0 in | Wt 167.0 lb

## 2020-04-16 DIAGNOSIS — C50411 Malignant neoplasm of upper-outer quadrant of right female breast: Secondary | ICD-10-CM

## 2020-04-16 DIAGNOSIS — Z9889 Other specified postprocedural states: Secondary | ICD-10-CM

## 2020-04-16 DIAGNOSIS — Z170 Estrogen receptor positive status [ER+]: Secondary | ICD-10-CM

## 2020-04-16 NOTE — Progress Notes (Signed)
   Subjective:    Patient ID: Megan [name], female    DOB: Sep 05, 1971, 49 y.o.   MRN: [id number]  The patient is a 49 year old female here for follow-up on her breast reconstruction.  She had bilateral mastectomies that were nipple sparing.  She does have some significant bruising around the nipple areola.  It will take some time to determine if we will need to change course at all.  For the moment it is okay.  I did not fill her today.  We will hopefully be able to do that next week.  There is no sign of seroma or hematoma.  The incision does appear to be healing nicely.     Review of Systems  Constitutional: Positive for activity change.  Eyes: Negative.   Respiratory: Negative.   Cardiovascular: Negative.   Gastrointestinal: Negative.   Musculoskeletal: Negative.   Skin: Positive for color change.       Objective:   Physical Exam Vitals and nursing note reviewed.  Constitutional:      Appearance: Normal appearance.  HENT:     Head: Normocephalic and atraumatic.  Cardiovascular:     Rate and Rhythm: Normal rate.     Pulses: Normal pulses.  Neurological:     General: No focal deficit present.     Mental Status: She is alert and oriented to person, place, and time.  Psychiatric:        Mood and Affect: Mood normal.        Behavior: Behavior normal.        Thought Content: Thought content normal.        Assessment & Plan:     ICD-10-CM   1. Malignant neoplasm of upper-outer quadrant of right breast in female, estrogen receptor positive ([place])  C50.411    Z17.0   2. S/P breast reconstruction, bilateral  Z98.890     We did not feel any today we will plan on next week.  Hopefully we will be able to get the drains out as well.

## 2020-04-18 NOTE — Progress Notes (Signed)
Patient Care Team: Isaac Bliss, Rayford Halsted, MD as PCP - General (Internal Medicine) Mauro Kaufmann, RN as Oncology Nurse Navigator Rockwell Germany, RN as Oncology Nurse Navigator  DIAGNOSIS:    ICD-10-CM   1. Malignant neoplasm of upper-outer quadrant of right breast in female, estrogen receptor positive ([place])  C50.411    Z17.0     SUMMARY OF ONCOLOGIC HISTORY: Oncology History  Malignant neoplasm of upper-outer quadrant of right breast in female, estrogen receptor positive ([place])  02/20/2020 Cancer Staging   Staging form: Breast, AJCC 8th Edition - Clinical stage from 02/20/2020: Stage 0 (cTis (DCIS), cN0, cM0, ER+, PR+) - Signed by Gardenia Phlegm, NP on 02/28/2020   02/28/2020 Initial Diagnosis   Screening mammogram detected bilateral breast masses. Bilateral US showed a 0.8cm right breast mass, 8:30 position, a 0.8cm mass, 2 o'clock position, no axillary adenopathy bilaterally. Biopsy showed intermediate grade DCIS in the right breast, ER+ 95%, PR+ 90%, and in the left breast, a papillary lesion with extensive apocrine metaplasia.    Genetic Testing   Patient had genetic testing ordered through Dr. Corinna Capra at [facility] in 2019. Negative genetic testing. No pathogenic variants identified on the [facility] panel. The report date is 12/08/2018.  The [facility] gene panel offered by [employer] includes sequencing and deletion/duplication testing of the following 35 genes: APC, ATM, AXIN2, BARD1, BMPR1A, BRCA1, BRCA2, BRIP1, CHD1, CDK4, CDKN2A, CHEK2, EPCAM (large rearrangement only), HOXB13, GALNT12, MLH1, MSH2, MSH3, MSH6, MUTYH, NBN, NTHL1, PALB2, PMS2, PTEN, RAD51C, RAD51D, RNF43, RPS20, SMAD4, STK11, and TP53. Sequencing was performed for select regions of POLE and POLD1, and large rearrangement analysis was performed for select regions of GREM1.   04/08/2020 Surgery   Bilateral mastectomies with reconstruction Marlou Starks & [place]): [place] and no  evidence of malignancy in the left breast, and in the right breast, ductal hyperplasia and no evidence of carcinoma, with one right axillary lymph node negative      CHIEF COMPLIANT: Follow-up s/p bilateral mastectomies to review pathology   INTERVAL HISTORY: Megan [name] is a 49 y.o. with above-mentioned history of right breast DCIS. She underwent bilateral mastectomies with reconstruction on 04/08/20 with Dr. Marlou Starks and Dr. Marla Roe for which pathology showed [place] and no evidence of malignancy in the left breast, and in the right breast, ductal hyperplasia and no evidence of carcinoma, with one right axillary lymph node negative for carcinoma. She presents to the clinic today to review the pathology report and discuss further treatment.   ALLERGIES:  is allergic to no known allergies.  MEDICATIONS:  Current Outpatient Medications  Medication Sig Dispense Refill  . cetirizine (ZYRTEC) 10 MG tablet Take 10 mg by mouth daily.    . diazepam (VALIUM) 2 MG tablet Take 1 tablet (2 mg total) by mouth every 12 (twelve) hours as needed for up to 20 days for muscle spasms. 40 tablet 0  . esomeprazole (NEXIUM) 40 MG capsule Take 1 capsule (40 mg total) by mouth daily. 14 capsule 0  . fluticasone (FLONASE) 50 MCG/ACT nasal spray Place 2 sprays into both nostrils daily. 16 g 6  . levonorgestrel (MIRENA) 20 MCG/24HR IUD 1 each by Intrauterine route once.    . ondansetron (ZOFRAN) 4 MG tablet Take 1 tablet (4 mg total) by mouth every 8 (eight) hours as needed for nausea or vomiting. 20 tablet 0   No current facility-administered medications for this visit.    PHYSICAL EXAMINATION: ECOG PERFORMANCE STATUS: 1 - Symptomatic but  completely ambulatory  Vitals:   04/19/20 1005  BP: 131/77  Pulse: 67  Resp: 18  Temp: 98.5 F (36.9 C)  SpO2: 100%   Filed Weights   04/19/20 1005  Weight: 166 lb 12.8 oz (75.7 kg)    LABORATORY DATA:  I have reviewed the data as listed CMP Latest Ref Rng & Units  11/17/2019 05/26/2018 01/25/2015  Glucose 70 - 99 mg/dL 105(H) 88 91  BUN 6 - 23 mg/dL _0 Creatinine 0.40 - 1.20 mg/dL 0.67 0.75 0.71  Sodium 135 - 145 mEq/L 141 139 138  Potassium 3.5 - 5.1 mEq/L 4.4 4.0 4.1  Chloride 96 - 112 mEq/L 104 103 105  CO2 19 - 32 mEq/L _1 Calcium 8.4 - 10.5 mg/dL 9.5 9.9 9.3  Total Protein 6.0 - 8.3 g/dL 6.9 7.7 7.4  Total Bilirubin 0.2 - 1.2 mg/dL 0.9 0.4 0.7  Alkaline Phos 39 - 117 U/L 89 80 84  AST 0 - 37 U/L _2 ALT 0 - 35 U/L _3 Lab Results  Component Value Date   WBC 8.4 11/17/2019   HGB 14.0 11/17/2019   HCT 42.2 11/17/2019   MCV 93.5 11/17/2019   PLT 285.0 11/17/2019   NEUTROABS 5.5 11/17/2019    ASSESSMENT & PLAN:  Malignant neoplasm of upper-outer quadrant of right breast in female, estrogen receptor positive ([place]) 02/28/2020:Screening mammogram detected bilateral breast masses. Bilateral US on 02/16/20 showed a 0.8cm right breast mass at the 8:30 position, a 0.8cm mass at the 2 o'clock position, with no axillary adenopathy bilaterally. Biopsy on 02/20/20 showed intermediate grade DCIS in the right breast, ER+ 95%, PR+ 90%, and in the left breast, a papillary lesion with extensive apocrine metaplasia. 04/08/2020: Bilateral mastectomies with reconstruction: Left mastectomy: Fibrocystic changes, PASH, no malignancy right mastectomy: Fibrocystic change, UT H, no malignancy, 0/1 lymph node negative Genetic testing: Negative  Treatment plan: Because she underwent bilateral mastectomies there is no role of radiation or antiestrogen therapies. She can be followed annually in the long-term survivorship clinic.     No orders of the defined types were placed in this encounter.  The patient has a good understanding of the overall plan. she agrees with it. she will call with any problems that may develop before the next visit here.  Total time spent: 30 mins including face to face time and time spent for planning, charting and  coordination of care  Nicholas Lose, MD 04/19/2020  I, Cloyde Reams Dorshimer, am acting as scribe for Dr. Nicholas Lose.  I have reviewed the above documentation for accuracy and completeness, and I agree with the above.

## 2020-04-19 ENCOUNTER — Other Ambulatory Visit: Payer: Self-pay

## 2020-04-19 ENCOUNTER — Inpatient Hospital Stay: Payer: 59 | Attending: Hematology and Oncology | Admitting: Hematology and Oncology

## 2020-04-19 DIAGNOSIS — C50411 Malignant neoplasm of upper-outer quadrant of right female breast: Secondary | ICD-10-CM | POA: Diagnosis not present

## 2020-04-19 DIAGNOSIS — Z170 Estrogen receptor positive status [ER+]: Secondary | ICD-10-CM

## 2020-04-19 DIAGNOSIS — Z86000 Personal history of in-situ neoplasm of breast: Secondary | ICD-10-CM | POA: Insufficient documentation

## 2020-04-19 DIAGNOSIS — Z9013 Acquired absence of bilateral breasts and nipples: Secondary | ICD-10-CM | POA: Diagnosis not present

## 2020-04-19 NOTE — Assessment & Plan Note (Signed)
02/28/2020:Screening mammogram detected bilateral breast masses. Bilateral US on 02/16/20 showed a 0.8cm right breast mass at the 8:30 position, a 0.8cm mass at the 2 o'clock position, with no axillary adenopathy bilaterally. Biopsy on 02/20/20 showed intermediate grade DCIS in the right breast, ER+ 95%, PR+ 90%, and in the left breast, a papillary lesion with extensive apocrine metaplasia. 04/08/2020: Bilateral mastectomies with reconstruction: Left mastectomy: Fibrocystic changes, PASH, no malignancy right mastectomy: Fibrocystic change, UT H, no malignancy, 0/1 lymph node negative Genetic testing: Negative  Treatment plan: Because she underwent bilateral mastectomies there is no role of radiation or antiestrogen therapies. She can be followed annually in the long-term survivorship clinic.

## 2020-04-22 ENCOUNTER — Encounter: Payer: Self-pay | Admitting: *Deleted

## 2020-04-23 ENCOUNTER — Encounter: Payer: Self-pay | Admitting: Surgical

## 2020-04-23 ENCOUNTER — Other Ambulatory Visit: Payer: Self-pay

## 2020-04-23 ENCOUNTER — Ambulatory Visit (INDEPENDENT_AMBULATORY_CARE_PROVIDER_SITE_OTHER): Payer: 59 | Admitting: Surgical

## 2020-04-23 ENCOUNTER — Telehealth: Payer: Self-pay

## 2020-04-23 VITALS — BP 118/81 | HR 78 | Temp 97.5°F | Ht 65.0 in | Wt 167.0 lb

## 2020-04-23 DIAGNOSIS — Z170 Estrogen receptor positive status [ER+]: Secondary | ICD-10-CM

## 2020-04-23 DIAGNOSIS — Z9889 Other specified postprocedural states: Secondary | ICD-10-CM

## 2020-04-23 DIAGNOSIS — C50411 Malignant neoplasm of upper-outer quadrant of right female breast: Secondary | ICD-10-CM

## 2020-04-23 NOTE — Telephone Encounter (Signed)
Orders for dressing/supplies faxed to [facility] per [place], [place] Copy scanned into chart

## 2020-04-23 NOTE — Progress Notes (Addendum)
Patient is a 49 year old female here for follow-up after immediate bilateral breast reconstruction, she had expanders placed on 04/08/2020 with Dr. Marla Roe after bilateral mastectomy.  She is doing well overall, she reports that she noticed she would get really itchy when taking Valium, but would not develop a rash.  She has since stopped taking it.  She has been applying Vaseline to left breast incision.  She has bilateral JP drains in place, approximately 20 cc of output in each drain over 24 hours for the last few days.  Chaperone present on exam I do not notice any sign of dehiscence, incisions are healing as expected.  She does have a little bit of superficial skin sloughing at the medial aspect of her left NAC and the lateral aspect of her right NAC.  The left side is 6 x 4 cmand the right side is 4x3 cm. it does not appear to be deep, I do not notice any erythema or foul odor.  No purulence noted.  No fluid collections noted.  Bilateral JP drains in place.   Plan: Overall Megan [name] is doing well, she does have a little bit of superficial skin sloughing, but this appears to be healing well.  Will order Xeroform for patient to apply daily.  It is improving.  There is no sign of any infection, seroma, hematoma.  We did not fill today due to the skin sloughing.  Right: 0 cc for a total of 200/535 cc Left: 0 cc for a total of 200/535 cc  Follow-up in 1 week for reevaluation, plan to fill at that time

## 2020-04-24 ENCOUNTER — Other Ambulatory Visit: Payer: Self-pay | Admitting: Surgical

## 2020-04-24 DIAGNOSIS — Z9013 Acquired absence of bilateral breasts and nipples: Secondary | ICD-10-CM | POA: Diagnosis not present

## 2020-04-24 MED ORDER — CYCLOBENZAPRINE HCL 5 MG PO TABS: 5.0000 mg | ORAL_TABLET | Freq: Three times a day (TID) | ORAL | 0 refills | Status: DC | PRN

## 2020-04-24 NOTE — Progress Notes (Signed)
Flexeril for muscle spasms/pain with expansion. Pt having itching with valium

## 2020-04-29 ENCOUNTER — Telehealth: Payer: Self-pay

## 2020-04-29 NOTE — Telephone Encounter (Signed)
Orders faxed to [facility] for wound/dressing supplies per [place], [place] Copy scanned into chart

## 2020-04-29 NOTE — Progress Notes (Signed)
Patient is a 49 year old female here for follow-up after immediate bilateral breast reconstruction and placement of expanders on 04/08/2020 with Dr. Marla Roe.   She had some superficial epithelial skin sloughing postoperatively, she has been applying Xeroform to this daily.  It appears to be doing better today.  She is not having any fevers, chills, nausea, vomiting, drainage.  Due to the superficial sloughing and risk of necrosis due to decreased blood supply we have not filled up until this point.    Chaperone present on exam On exam bilateral breast epithelial skin sloughing is beginning to heal with new epithelium noted beneath.  There is no additional sloughing noted along the breasts.  She does have some residual ecchymosis noted along the left medial breast.  I do not feel any large collections of fluid or blood.  Due to the breast folds of the left and right breast she is developing some superficial wounds at this time.  There is no periwound erythema or foul odor.  I do not notice any drainage.  The wound appears partial-thickness at this time and I do not see any of the expander.   Recommend applying Xeroform to bilateral breast daily and 4 x 4 gauze to bilateral breast daily within the inframammary fold to prevent additional skin breakdown.  The breakdown within the inframammary fold is likely due to the implant folding in on itself and the 2 skin edges being in contact continuously.  There is no sign of any infection, seroma.  Possible residual hematoma, but no increase in size since last visit or worsening changes noted.  We placed injectable saline in the Expander using a sterile technique: Right: 50 cc for a total of 200/535 cc Left: 75 cc for a total of 200/535 cc

## 2020-04-30 ENCOUNTER — Other Ambulatory Visit: Payer: Self-pay

## 2020-04-30 ENCOUNTER — Encounter: Payer: Self-pay | Admitting: Surgical

## 2020-04-30 ENCOUNTER — Ambulatory Visit (INDEPENDENT_AMBULATORY_CARE_PROVIDER_SITE_OTHER): Payer: 59 | Admitting: Surgical

## 2020-04-30 VITALS — BP 114/80 | HR 82 | Temp 97.8°F

## 2020-04-30 DIAGNOSIS — Z170 Estrogen receptor positive status [ER+]: Secondary | ICD-10-CM

## 2020-04-30 DIAGNOSIS — C50411 Malignant neoplasm of upper-outer quadrant of right female breast: Secondary | ICD-10-CM

## 2020-04-30 DIAGNOSIS — Z9889 Other specified postprocedural states: Secondary | ICD-10-CM

## 2020-05-03 DIAGNOSIS — Z6827 Body mass index (BMI) 27.0-27.9, adult: Secondary | ICD-10-CM | POA: Diagnosis not present

## 2020-05-03 DIAGNOSIS — Z01419 Encounter for gynecological examination (general) (routine) without abnormal findings: Secondary | ICD-10-CM | POA: Diagnosis not present

## 2020-05-09 ENCOUNTER — Encounter: Payer: Self-pay | Admitting: Plastic Surgery

## 2020-05-09 ENCOUNTER — Other Ambulatory Visit: Payer: Self-pay

## 2020-05-09 ENCOUNTER — Ambulatory Visit (INDEPENDENT_AMBULATORY_CARE_PROVIDER_SITE_OTHER): Payer: 59 | Admitting: Plastic Surgery

## 2020-05-09 VITALS — BP 138/82 | HR 75 | Temp 98.0°F | Ht 65.0 in | Wt 168.0 lb

## 2020-05-09 DIAGNOSIS — Z9889 Other specified postprocedural states: Secondary | ICD-10-CM

## 2020-05-09 NOTE — Progress Notes (Signed)
   Subjective:    Patient ID: Megan [name], female    DOB: [birth year], 49 y.o.   MRN: [id number]  The patient is a 49 year old female here for follow-up for undergoing bilateral nipple sparing mastectomies overall she is doing well.  There is no sign of hematoma or seroma.  There is a little bit of breakdown at the inframammary incision site on both sides.  She has been using Xeroform.  I does not appear to be infected.     Review of Systems  Constitutional: Negative.   HENT: Negative.   Eyes: Negative.   Respiratory: Negative.   Cardiovascular: Negative.   Gastrointestinal: Negative.   Genitourinary: Negative.        Objective:   Physical Exam Vitals and nursing note reviewed.  Constitutional:      Appearance: Normal appearance.  HENT:     Head: Normocephalic and atraumatic.  Cardiovascular:     Rate and Rhythm: Normal rate.     Pulses: Normal pulses.  Pulmonary:     Effort: Pulmonary effort is normal.  Neurological:     General: No focal deficit present.     Mental Status: She is alert and oriented to person, place, and time.         Assessment & Plan:     ICD-10-CM   1. S/P breast reconstruction, bilateral  Z98.890   Continue with the Xeroform to the incision site daily.  I would like to see her back in 10 days. We placed injectable saline in the Expander using a sterile technique: Right: 50 cc for a total of 250/535 cc Left: 50 cc for a total of 250/535 cc

## 2020-05-12 ENCOUNTER — Encounter: Payer: Self-pay | Admitting: Plastic Surgery

## 2020-05-14 ENCOUNTER — Telehealth (INDEPENDENT_AMBULATORY_CARE_PROVIDER_SITE_OTHER): Payer: 59 | Admitting: Internal Medicine

## 2020-05-14 DIAGNOSIS — L299 Pruritus, unspecified: Secondary | ICD-10-CM

## 2020-05-14 DIAGNOSIS — R1013 Epigastric pain: Secondary | ICD-10-CM

## 2020-05-14 NOTE — Progress Notes (Signed)
Virtual Visit via Video Note  I connected with Megan [name] on 05/14/20 at  3:30 PM EDT by a video enabled telemedicine application and verified that I am speaking with the correct person using two identifiers.  Location patient: home Location provider: work office Persons participating in the virtual visit: patient, provider  I discussed the limitations of evaluation and management by telemedicine and the availability of in person appointments. The patient expressed understanding and agreed to proceed.   HPI: She has scheduled this visit to discuss some acute concerns.  She has had some significant changes to her medical history since I last saw her in December.  Abnormalities noted on her screening mammogram, she was diagnosed with ductal carcinoma in situ, she elected to have bilateral mastectomies and is now undergoing reconstruction with plastic surgery.  This surgery occurred on April 26.  On 5/5 she started noticing some generalized pruritus.  On 5/19 she also started having some epigastric abdominal pain and some nausea and vomiting.  Then she noticed bright yellow urine and tan-colored stool which concerned her.  The itching has never ceased.  2 days ago she again had another episode of severe abdominal pain and this prompted her to schedule this visit.  Her LFTs were normal in December.  She has a history of GERD and takes PPIs.  She denies fever.  She has not noticed a yellow tint to her skin.   ROS: Constitutional: Denies fever, chills, diaphoresis, appetite change and fatigue.  HEENT: Denies photophobia, eye pain, redness, hearing loss, ear pain, congestion, sore throat, rhinorrhea, sneezing, mouth sores, trouble swallowing, neck pain, neck stiffness and tinnitus.   Respiratory: Denies SOB, DOE, cough, chest tightness,  and wheezing.   Cardiovascular: Denies chest pain, palpitations and leg swelling.  Gastrointestinal: Denies diarrhea, constipation, blood in stool and  abdominal distention.  Genitourinary: Denies dysuria, urgency, frequency, hematuria, flank pain and difficulty urinating.  Endocrine: Denies: hot or cold intolerance, sweats, changes in hair or nails, polyuria, polydipsia. Musculoskeletal: Denies myalgias, back pain, joint swelling, arthralgias and gait problem.  Skin: Denies pallor, rash and wound.  Neurological: Denies dizziness, seizures, syncope, weakness, light-headedness, numbness and headaches.  Hematological: Denies adenopathy. Easy bruising, personal or family bleeding history  Psychiatric/Behavioral: Denies suicidal ideation, mood changes, confusion, nervousness, sleep disturbance and agitation   Past Medical History:  Diagnosis Date  . Allergic rhinitis   . Allergy   . Family history of breast cancer   . Family history of colon cancer   . Family history of prostate cancer   . GERD (gastroesophageal reflux disease)   . MRSA infection 2017   left buttock  . SVD (spontaneous vaginal delivery)    x 1  . TB lung, latent    +  quantiferon, neg PPD - s/p treatment - completed    Past Surgical History:  Procedure Laterality Date  . BREAST BIOPSY Right 08/24/2012   benign  . BREAST RECONSTRUCTION WITH PLACEMENT OF TISSUE EXPANDER AND FLEX HD (ACELLULAR HYDRATED DERMIS) Bilateral 04/08/2020   Procedure: BILATERAL BREAST RECONSTRUCTION WITH PLACEMENT OF TISSUE EXPANDER AND FLEX HD (ACELLULAR HYDRATED DERMIS);  Surgeon: Wallace Going, DO;  Location: [facility];  Service: Plastics;  Laterality: Bilateral;  . CESAREAN SECTION     x 1  . DILATION AND CURETTAGE OF UTERUS     x 2  . NIPPLE SPARING MASTECTOMY WITH SENTINEL LYMPH NODE BIOPSY Bilateral 04/08/2020   Procedure: RIGHT NIPPLE SPARING MASTECTOMY WITH RIGHT SENTINEL  LYMPH NODE BIOPSY, LEFT PROPHYLACTIC MASTECTOMY;  Surgeon: Jovita Kussmaul, MD;  Location: [facility];  Service: General;  Laterality: Bilateral;  . WISDOM TOOTH EXTRACTION       Family History  Problem Relation Age of Onset  . Hypertension Mother   . Breast cancer Mother 59  . Hyperlipidemia Father   . Diabetes Father   . Crohn's disease Father   . Stroke Maternal Grandmother   . Diabetes Maternal Grandmother   . Prostate cancer Paternal Uncle   . Colon cancer Paternal Grandmother   . Breast cancer Other   . Rectal cancer Neg Hx   . Stomach cancer Neg Hx   . Esophageal cancer Neg Hx     SOCIAL HX:   reports that she has never smoked. She has never used smokeless tobacco. She reports previous alcohol use. She reports that she does not use drugs.   Current Outpatient Medications:  .  cetirizine (ZYRTEC) 10 MG tablet, Take 10 mg by mouth daily., Disp: , Rfl:  .  cyclobenzaprine (FLEXERIL) 5 MG tablet, Take 1 tablet (5 mg total) by mouth 3 (three) times daily as needed for muscle spasms., Disp: 30 tablet, Rfl: 0 .  esomeprazole (NEXIUM) 40 MG capsule, Take 1 capsule (40 mg total) by mouth daily., Disp: 14 capsule, Rfl: 0 .  fluticasone (FLONASE) 50 MCG/ACT nasal spray, Place 2 sprays into both nostrils daily., Disp: 16 g, Rfl: 6 .  levonorgestrel (MIRENA) 20 MCG/24HR IUD, 1 each by Intrauterine route once., Disp: , Rfl:  .  ondansetron (ZOFRAN) 4 MG tablet, Take 1 tablet (4 mg total) by mouth every 8 (eight) hours as needed for nausea or vomiting., Disp: 20 tablet, Rfl: 0  EXAM:   VITALS per patient if applicable: None reported  GENERAL: alert, oriented, appears well and in no acute distress, I do not note a yellow tint to her skin on video camera.  HEENT: atraumatic, conjunttiva clear, no obvious abnormalities on inspection of external nose and ears  NECK: normal movements of the head and neck  LUNGS: on inspection no signs of respiratory distress, breathing rate appears normal, no obvious gross increased work of breathing, gasping or wheezing  CV: no obvious cyanosis  MS: moves all visible extremities without noticeable  abnormality  PSYCH/NEURO: pleasant and cooperative, no obvious depression or anxiety, speech and thought processing grossly intact  ASSESSMENT AND PLAN:   Epigastric pain  Pruritus   -The epigastric pain coupled with pruritus and changes in color of urine and stool make me concerned for obstructive biliary process. -I will place orders for CBC, BMP, hepatic function panel, UA and a CT scan of the abdomen. -Further work-up pending these results.     I discussed the assessment and treatment plan with the patient. The patient was provided an opportunity to ask questions and all were answered. The patient agreed with the plan and demonstrated an understanding of the instructions.   The patient was advised to call back or seek an in-person evaluation if the symptoms worsen or if the condition fails to improve as anticipated.    Lelon Frohlich, MD  [facility] at [facility]

## 2020-05-15 ENCOUNTER — Other Ambulatory Visit: Payer: Self-pay | Admitting: Internal Medicine

## 2020-05-15 ENCOUNTER — Other Ambulatory Visit (INDEPENDENT_AMBULATORY_CARE_PROVIDER_SITE_OTHER): Payer: 59

## 2020-05-15 ENCOUNTER — Other Ambulatory Visit: Payer: Self-pay

## 2020-05-15 DIAGNOSIS — L299 Pruritus, unspecified: Secondary | ICD-10-CM

## 2020-05-15 DIAGNOSIS — R1084 Generalized abdominal pain: Secondary | ICD-10-CM

## 2020-05-15 DIAGNOSIS — R1013 Epigastric pain: Secondary | ICD-10-CM | POA: Diagnosis not present

## 2020-05-15 LAB — CBC WITH DIFFERENTIAL/PLATELET
Basophils Absolute: 0.1 10*3/uL (ref 0.0–0.1)
Basophils Relative: 1.1 % (ref 0.0–3.0)
Eosinophils Absolute: 0.2 10*3/uL (ref 0.0–0.7)
Eosinophils Relative: 3.2 % (ref 0.0–5.0)
HCT: 39.8 % (ref 36.0–46.0)
Hemoglobin: 13.4 g/dL (ref 12.0–15.0)
Lymphocytes Relative: 29.8 % (ref 12.0–46.0)
Lymphs Abs: 2 10*3/uL (ref 0.7–4.0)
MCHC: 33.6 g/dL (ref 30.0–36.0)
MCV: 95.2 fl (ref 78.0–100.0)
Monocytes Absolute: 0.7 10*3/uL (ref 0.1–1.0)
Monocytes Relative: 10.4 % (ref 3.0–12.0)
Neutro Abs: 3.8 10*3/uL (ref 1.4–7.7)
Neutrophils Relative %: 55.5 % (ref 43.0–77.0)
Platelets: 356 10*3/uL (ref 150.0–400.0)
RBC: 4.18 Mil/uL (ref 3.87–5.11)
RDW: 13.8 % (ref 11.5–15.5)
WBC: 6.8 10*3/uL (ref 4.0–10.5)

## 2020-05-15 LAB — BASIC METABOLIC PANEL
BUN: 12 mg/dL (ref 6–23)
CO2: 29 mEq/L (ref 19–32)
Calcium: 10.1 mg/dL (ref 8.4–10.5)
Chloride: 100 mEq/L (ref 96–112)
Creatinine, Ser: 0.66 mg/dL (ref 0.40–1.20)
GFR: 96.13 mL/min (ref >60.00)
Glucose, Bld: 133 mg/dL — ABNORMAL HIGH (ref 70–99)
Potassium: 4.3 mEq/L (ref 3.5–5.1)
Sodium: 137 mEq/L (ref 135–145)

## 2020-05-15 LAB — HEPATIC FUNCTION PANEL
ALT: 170 U/L — ABNORMAL HIGH (ref 0–35)
AST: 81 U/L — ABNORMAL HIGH (ref 0–37)
Albumin: 4.7 g/dL (ref 3.5–5.2)
Alkaline Phosphatase: 290 U/L — ABNORMAL HIGH (ref 39–117)
Bilirubin, Direct: 1.5 mg/dL — ABNORMAL HIGH (ref 0.0–0.3)
Total Bilirubin: 3.3 mg/dL — ABNORMAL HIGH (ref 0.2–1.2)
Total Protein: 7.3 g/dL (ref 6.0–8.3)

## 2020-05-15 LAB — URINALYSIS, ROUTINE W REFLEX MICROSCOPIC
Bilirubin Urine: NEGATIVE
Hgb urine dipstick: NEGATIVE
Ketones, ur: NEGATIVE
Leukocytes,Ua: NEGATIVE
Nitrite: NEGATIVE
RBC / HPF: NONE SEEN (ref 0–?)
Specific Gravity, Urine: 1.015 (ref 1.000–1.030)
Total Protein, Urine: NEGATIVE
Urine Glucose: NEGATIVE
Urobilinogen, UA: 0.2 (ref 0.0–1.0)
WBC, UA: NONE SEEN (ref 0–?)
pH: 6 (ref 5.0–8.0)

## 2020-05-16 ENCOUNTER — Telehealth: Payer: Self-pay | Admitting: Internal Medicine

## 2020-05-16 ENCOUNTER — Ambulatory Visit: Payer: 59 | Admitting: Internal Medicine

## 2020-05-16 NOTE — Telephone Encounter (Signed)
Pt is calling in to check the status of a referral that Dr. Jerilee Hoh was placing for her from the appointment on 05/14/2020.   Pt would like to have a call back.

## 2020-05-16 NOTE — Telephone Encounter (Signed)
Gave the patient the phone number to [facility].  Is there anything else we can do?

## 2020-05-20 ENCOUNTER — Encounter: Payer: Self-pay | Admitting: Internal Medicine

## 2020-05-21 ENCOUNTER — Ambulatory Visit (INDEPENDENT_AMBULATORY_CARE_PROVIDER_SITE_OTHER): Payer: 59 | Admitting: Surgical

## 2020-05-21 ENCOUNTER — Encounter: Payer: Self-pay | Admitting: Surgical

## 2020-05-21 ENCOUNTER — Other Ambulatory Visit: Payer: Self-pay

## 2020-05-21 VITALS — BP 116/81 | HR 83 | Temp 97.5°F

## 2020-05-21 DIAGNOSIS — Z170 Estrogen receptor positive status [ER+]: Secondary | ICD-10-CM

## 2020-05-21 DIAGNOSIS — Z9889 Other specified postprocedural states: Secondary | ICD-10-CM

## 2020-05-21 DIAGNOSIS — C50411 Malignant neoplasm of upper-outer quadrant of right female breast: Secondary | ICD-10-CM

## 2020-05-21 NOTE — Progress Notes (Signed)
   Subjective:     Patient ID: Megan [name], female    DOB: [birth year], 49 y.o.   MRN: [id number]  Chief Complaint  Patient presents with  . Follow-up    HPI: The patient is a 49 y.o. female here for follow-up on her bilateral nipple sparing mastectomies with reconstruction.  She currently has expanders in place.  She continues to have some breakdown at the inframammary incisional sites on both sides.  She has been applying Xeroform daily -they are slowly improving.  She recently saw her PCP for evaluation of full body itching, abdominal pain - currently scheduled for CT scan to evaluate elevated liver enzymes.  Patient is otherwise doing well in regards to her breast reconstruction.  Tolerated last fill fine without any issues.  Review of Systems  Constitutional: Positive for activity change.  Respiratory: Negative.   Musculoskeletal: Negative for myalgias.  Skin: Positive for wound. Negative for rash.     Objective:   Vital Signs BP 116/81 (BP Location: Left Arm, Patient Position: Sitting, Cuff Size: Normal)   Pulse 83   Temp (!) 97.5 F (36.4 C) (Temporal)   SpO2 98%  Vital Signs and Nursing Note Reviewed Chaperone present Physical Exam  Constitutional: She is oriented to person, place, and time and well-developed, well-nourished, and in no distress.  Pulmonary/Chest: Effort normal.    Neurological: She is alert and oriented to person, place, and time. Gait normal.  Skin: Skin is warm and dry. No rash noted. She is not diaphoretic. No erythema.  Psychiatric: Mood and affect normal.      Assessment/Plan:     ICD-10-CM   1. S/P breast reconstruction, bilateral  Z98.890   2. Malignant neoplasm of upper-outer quadrant of right breast in female, estrogen receptor positive ([place])  C50.411    Z17.0     Donated ACell applied to bilateral inframammary fold wounds along with Adaptic, K-Y jelly, 4 x 4 gauze.  Recommend applying K-Y jelly daily to Adaptic mesh, change  Adaptic mesh in 2 to 3 days.  She can shower on Friday (3 days).  Continue to apply Adaptic and K-Y jelly for 2 more days and then begin using Xeroform gauze on Sunday.  Hopeful that the donated ACell will provide good granulation tissue and allow wound to epithelialize.  With the rolled edges this may be difficult and this may require surgical intervention for correction.  Patient currently awaiting CT scan for evaluation of elevated liver enzymes, abdominal pain, vomiting  Follow-up in 1 week for reevaluation.  We placed injectable saline in the Expander using a sterile technique: Right: 30 cc for a total of 280/535 cc Left: 30 cc for a total of 280/535 cc  Pictures were obtained of the patient and placed in the chart with the patient's or guardian's permission.   Carola Rhine Valita Righter, PA-C 05/21/2020, 8:40 AM

## 2020-05-22 DIAGNOSIS — D0511 Intraductal carcinoma in situ of right breast: Secondary | ICD-10-CM | POA: Diagnosis not present

## 2020-05-23 ENCOUNTER — Ambulatory Visit
Admission: RE | Admit: 2020-05-23 | Discharge: 2020-05-23 | Disposition: A | Discharge status: Home / Self Care | Payer: 59 | Source: Ambulatory Visit | Attending: Internal Medicine | Admitting: Internal Medicine

## 2020-05-23 ENCOUNTER — Other Ambulatory Visit: Payer: Self-pay

## 2020-05-23 DIAGNOSIS — L299 Pruritus, unspecified: Secondary | ICD-10-CM

## 2020-05-23 DIAGNOSIS — K838 Other specified diseases of biliary tract: Secondary | ICD-10-CM | POA: Diagnosis not present

## 2020-05-23 DIAGNOSIS — K828 Other specified diseases of gallbladder: Secondary | ICD-10-CM | POA: Diagnosis not present

## 2020-05-23 DIAGNOSIS — R1013 Epigastric pain: Secondary | ICD-10-CM

## 2020-05-23 MED ORDER — IOPAMIDOL (ISOVUE-300) INJECTION 61%
100.0000 mL | Freq: Once | INTRAVENOUS | Status: AC | PRN
  Administered 2020-05-23: 100 mL via INTRAVENOUS

## 2020-05-24 ENCOUNTER — Telehealth: Payer: Self-pay | Admitting: *Deleted

## 2020-05-24 ENCOUNTER — Other Ambulatory Visit: Payer: Self-pay | Admitting: Adult Health

## 2020-05-24 DIAGNOSIS — K810 Acute cholecystitis: Secondary | ICD-10-CM

## 2020-05-24 NOTE — Telephone Encounter (Signed)
CALL REPORT: Diane called from [facility] requesting the provider review results for the CT abd/pelvis from 6/10.  IMPRESSION: 1. Distended gallbladder with mild diffuse wall thickening, suspicious for acute cholecystitis. 2. Mild diffuse biliary ductal dilatation. Recommend correlation with liver function tests, and consider abdomen MRI and MRCP without and with contrast for further evaluation. 3. Suspect small hepatic masses, which are indeterminate. Abdomen MRI without and with contrast is recommended for further characterization.  Message forwarded to [facility] as PCP is out of the office.

## 2020-05-24 NOTE — Telephone Encounter (Signed)
Order placed

## 2020-05-24 NOTE — Telephone Encounter (Signed)
Patient informed of the message below.

## 2020-05-24 NOTE — Telephone Encounter (Signed)
Her CT scan of the abdomen showed gallbladder inflammation that is likely from gallstones.  It would be recommended to be referred to general surgery for surgical removal of her gallbladder.  Her CT scan also showed mild dilation of her bile duct.  This could also be caused from the stone, I did is recommended to follow-up with an MRI of the liver.  She had a small hepatic mass which was indeterminate on the CT scan, but is likely benign.  Recommend follow-up with an MRI for that as well.   Please let me know if she is okay with referral to general surgery as well as the MRI and we can get scheduled

## 2020-05-24 NOTE — Telephone Encounter (Signed)
Spoke with the pt and informed her of the results.  Patient agreed to the referral for a general surgeon and asked that this be entered for Dr Autumn Messing as she has seen him for another surgery.  Patient declined the MRI due to presently having breast tissue expanders as she was told these include metallic.  Message forwarded to [facility].

## 2020-05-27 ENCOUNTER — Other Ambulatory Visit: Payer: Self-pay

## 2020-05-27 ENCOUNTER — Telehealth: Payer: Self-pay | Admitting: Gastroenterology

## 2020-05-27 ENCOUNTER — Ambulatory Visit (INDEPENDENT_AMBULATORY_CARE_PROVIDER_SITE_OTHER): Payer: 59 | Admitting: Gastroenterology

## 2020-05-27 ENCOUNTER — Telehealth: Payer: Self-pay

## 2020-05-27 ENCOUNTER — Other Ambulatory Visit (INDEPENDENT_AMBULATORY_CARE_PROVIDER_SITE_OTHER): Payer: 59

## 2020-05-27 ENCOUNTER — Encounter: Payer: Self-pay | Admitting: Gastroenterology

## 2020-05-27 VITALS — BP 126/80 | HR 68 | Ht 65.0 in | Wt 160.0 lb

## 2020-05-27 DIAGNOSIS — R1013 Epigastric pain: Secondary | ICD-10-CM | POA: Diagnosis not present

## 2020-05-27 DIAGNOSIS — G8929 Other chronic pain: Secondary | ICD-10-CM

## 2020-05-27 DIAGNOSIS — K831 Obstruction of bile duct: Secondary | ICD-10-CM

## 2020-05-27 DIAGNOSIS — R935 Abnormal findings on diagnostic imaging of other abdominal regions, including retroperitoneum: Secondary | ICD-10-CM

## 2020-05-27 DIAGNOSIS — R748 Abnormal levels of other serum enzymes: Secondary | ICD-10-CM | POA: Diagnosis not present

## 2020-05-27 DIAGNOSIS — K8050 Calculus of bile duct without cholangitis or cholecystitis without obstruction: Secondary | ICD-10-CM

## 2020-05-27 LAB — COMPREHENSIVE METABOLIC PANEL
ALT: 224 U/L — ABNORMAL HIGH (ref 0–35)
AST: 106 U/L — ABNORMAL HIGH (ref 0–37)
Albumin: 4.5 g/dL (ref 3.5–5.2)
Alkaline Phosphatase: 250 U/L — ABNORMAL HIGH (ref 39–117)
BUN: 17 mg/dL (ref 6–23)
CO2: 25 mEq/L (ref 19–32)
Calcium: 9.6 mg/dL (ref 8.4–10.5)
Chloride: 104 mEq/L (ref 96–112)
Creatinine, Ser: 0.67 mg/dL (ref 0.40–1.20)
GFR: 94.46 mL/min (ref >60.00)
Glucose, Bld: 127 mg/dL — ABNORMAL HIGH (ref 70–99)
Potassium: 3.9 mEq/L (ref 3.5–5.1)
Sodium: 137 mEq/L (ref 135–145)
Total Bilirubin: 3.5 mg/dL — ABNORMAL HIGH (ref 0.2–1.2)
Total Protein: 7.5 g/dL (ref 6.0–8.3)

## 2020-05-27 LAB — CBC WITH DIFFERENTIAL/PLATELET
Basophils Absolute: 0 10*3/uL (ref 0.0–0.1)
Basophils Relative: 0.6 % (ref 0.0–3.0)
Eosinophils Absolute: 0.1 10*3/uL (ref 0.0–0.7)
Eosinophils Relative: 1.6 % (ref 0.0–5.0)
HCT: 39.3 % (ref 36.0–46.0)
Hemoglobin: 13.4 g/dL (ref 12.0–15.0)
Lymphocytes Relative: 28.9 % (ref 12.0–46.0)
Lymphs Abs: 1.9 10*3/uL (ref 0.7–4.0)
MCHC: 34 g/dL (ref 30.0–36.0)
MCV: 95.2 fl (ref 78.0–100.0)
Monocytes Absolute: 0.5 10*3/uL (ref 0.1–1.0)
Monocytes Relative: 8.2 % (ref 3.0–12.0)
Neutro Abs: 4.1 10*3/uL (ref 1.4–7.7)
Neutrophils Relative %: 60.7 % (ref 43.0–77.0)
Platelets: 333 10*3/uL (ref 150.0–400.0)
RBC: 4.13 Mil/uL (ref 3.87–5.11)
RDW: 13.2 % (ref 11.5–15.5)
WBC: 6.7 10*3/uL (ref 4.0–10.5)

## 2020-05-27 LAB — BILIRUBIN, DIRECT: Bilirubin, Direct: 1.9 mg/dL — ABNORMAL HIGH (ref 0.0–0.3)

## 2020-05-27 NOTE — Telephone Encounter (Signed)
Dr. Tarri Glenn, your 1:30pm was already taken but there was an opening on 6/16 at 1:50pm so I put pt there. Thank you.

## 2020-05-27 NOTE — Progress Notes (Addendum)
Referring Provider: Isaac Bliss, [place]* Primary Care Physician:  Isaac Bliss, Rayford Halsted, MD  Reason for Consultation:  Do I need an ERCP?   IMPRESSION:  Intermittent, post-prandial RUQ pain, nausea, and vomiting Abnormal liver enzymes    - normal 11/17/2019 Abnormal gallbladder on CT suggesting cholecystitis Mild diffuse biliary ductal dilatation No stones seen on imaging Abnormal liver on MRI: Multiple small hepatic masses on contrasted CT 05/23/2020  Mild chronic gastritis without H pylori on EGD 12/01/18 Normal screening colonoscopy 12/01/18    - two small "polyps" were lymphoid nodules  Intermittent, postprandial abdominal pain, back pain, nausea, and vomiting with acutely elevated liver enzymes worrisome for obstructive jaundice. No fevers, chills, or night sweats.  Recent CT scan shows mild diffuse dilation of the biliary tree without stones, sludge, or stricture. The liver shows two lesions of unclear etiology that may be contributing to the lab abnormalities - ? Abscess, malignancy, benign hepatic tumor. Unfortunately, I confirmed with her plastic surgeon (assistant via phone)  that she cannot have an MRI while she has expanders in place and these will not be removed until July. Cefazolin is associated with drug-induced liver injury with associated nausea, abdominal pain, and pruritus, however, she has no fever, rash, or eosinophilia. Unusual presentation for [place].   Given the persistent elevation of the liver enzymes, will proceed with ERCP. Continue closely monitoring of liver enzymes. Plan MRI/MRCP to further clarify the liver masses when she is able to proceed.      PLAN: CMP, direct bilirubin, CBC - today ERCP - discussed with Dr. Lyndel Safe who agrees to perform the procedure MRI/MRCP when breast expanders are removed  Please see the "Patient Instructions" section for addition details about the plan.  HPI: Megan [name] is a 49 y.o. female referred by Dr. Marlou Starks for  further evaluation of abnormal liver enzymes and CT scan. The history is obtained through the patient, review of her electronic health record, and records provided by Dr. Marlou Starks. She was seen by me 10/14/2018 for reflux controlled on PPI therapy.   Bilateral nipple sparing mastectomies with reconstruction for ductal carcinoma in situ 04/08/20.  She currently has expanders in place. Received IV cefazolin preoperatively.    Developed generalized pruritus 04/17/20 that was temporally associated with Valium. Developed intermittent epigastric abdominal pain/back pain, nausea, and vomiting 05/01/20 followed by acholic stools, neon yellow urine and recurrent pruritus. Symptoms occur 3-4 hours after eating. Consistently improve with emesis within 2 hours. Symptoms are occuring weekly to 2-3 times weekly, and becoming from frequent. Last episode 05/22/20. Avoiding all greasy and fatty foods.   No response to hydrocodone, Zofran, or Benadryl.  No history of similar symptoms prior to mastectomies.   No alcohol. No other new medications. No fever, rash.   Liver enzymes were normal in December. Labs 05/15/2020 show a total bilirubin 3.3, direct bilirubin 1.5, alk phos 290, AST 81, ALT 170, albumin 4.7.  CBC was normal. No eosinophilia.  Labs 05/22/2020: TB 4.6, AST 78, ALT 157, alk phos 291 Labs 05/27/20: TB 3.5, DB 1.9, AST 106, ALT 224, alk phos 250  CT of the abdomen and pelvis with contrast 05/23/2020 was ordered by Dr. Isaac Bliss and showed a distended gallbladder with mild diffuse wall thickening suspicious for acute cholecystitis, mild diffuse biliary ductal dilatation with the CBD measuring 51m, and small hepatic masses (posterior right lobe measuring 3cm, lateral left lobe measuring 1.3 cm).  No gallstones seen. MRI recommended for further follow-up. Patient declined MRI due to  having breast tissue expanders and was told these could include metal.   Seen by Dr. Marlou Starks in follow-up. The patient reports that he is  planning a cholecystectomy but wanted her gallstones treated first.   The patient has had no prior abdominal imaging for comparison.   Chronic gastritis. Normal duodenal and  DC and TC polyps   Past Medical History:  Diagnosis Date  . Allergic rhinitis   . Allergy   . Family history of breast cancer   . Family history of colon cancer   . Family history of prostate cancer   . GERD (gastroesophageal reflux disease)   . MRSA infection 2017   left buttock  . SVD (spontaneous vaginal delivery)    x 1  . TB lung, latent    +  quantiferon, neg PPD - s/p treatment - completed    Past Surgical History:  Procedure Laterality Date  . BREAST BIOPSY Right 08/24/2012   benign  . BREAST RECONSTRUCTION WITH PLACEMENT OF TISSUE EXPANDER AND FLEX HD (ACELLULAR HYDRATED DERMIS) Bilateral 04/08/2020   Procedure: BILATERAL BREAST RECONSTRUCTION WITH PLACEMENT OF TISSUE EXPANDER AND FLEX HD (ACELLULAR HYDRATED DERMIS);  Surgeon: Wallace Going, DO;  Location: [place];  Service: Plastics;  Laterality: Bilateral;  . CESAREAN SECTION     x 1  . DILATION AND CURETTAGE OF UTERUS     x 2  . NIPPLE SPARING MASTECTOMY WITH SENTINEL LYMPH NODE BIOPSY Bilateral 04/08/2020   Procedure: RIGHT NIPPLE SPARING MASTECTOMY WITH RIGHT SENTINEL LYMPH NODE BIOPSY, LEFT PROPHYLACTIC MASTECTOMY;  Surgeon: Jovita Kussmaul, MD;  Location: [place];  Service: General;  Laterality: Bilateral;  . WISDOM TOOTH EXTRACTION      Current Outpatient Medications  Medication Sig Dispense Refill  . cetirizine (ZYRTEC) 10 MG tablet Take 10 mg by mouth daily.    . cyclobenzaprine (FLEXERIL) 5 MG tablet Take 1 tablet (5 mg total) by mouth 3 (three) times daily as needed for muscle spasms. 30 tablet 0  . esomeprazole (NEXIUM) 40 MG capsule Take 1 capsule (40 mg total) by mouth daily. 14 capsule 0  . fluticasone (FLONASE) 50 MCG/ACT nasal spray Place 2 sprays into both nostrils daily. 16 g 6  .  levonorgestrel (MIRENA) 20 MCG/24HR IUD 1 each by Intrauterine route once.    . ondansetron (ZOFRAN) 4 MG tablet Take 1 tablet (4 mg total) by mouth every 8 (eight) hours as needed for nausea or vomiting. 20 tablet 0   No current facility-administered medications for this visit.    Allergies as of 05/27/2020 - Review Complete 05/21/2020  Allergen Reaction Noted  . No known allergies  03/05/2020  . Valium [diazepam]  04/23/2020    Family History  Problem Relation Age of Onset  . Hypertension Mother   . Breast cancer Mother 58  . Hyperlipidemia Father   . Diabetes Father   . Crohn's disease Father   . Stroke Maternal Grandmother   . Diabetes Maternal Grandmother   . Prostate cancer Paternal Uncle   . Colon cancer Paternal Grandmother   . Breast cancer Other   . Rectal cancer Neg Hx   . Stomach cancer Neg Hx   . Esophageal cancer Neg Hx     Social History   Socioeconomic History  . Marital status: Married    Spouse name: Not on file  . Number of children: 2  . Years of education: Not on file  . Highest education level: Not on file  Occupational  History  . Occupation: Nurse Tourist information centre manager  Tobacco Use  . Smoking status: Never Smoker  . Smokeless tobacco: Never Used  Vaping Use  . Vaping Use: Never used  Substance and Sexual Activity  . Alcohol use: Not Currently    Comment: twice a year  . Drug use: No  . Sexual activity: Yes    Birth control/protection: I.U.D.    Comment: Mirena IUD-Inserted 2018  Other Topics Concern  . Not on file  Social History Narrative   Occupation: Therapist, sports - [employer]   Married - 2 kids (60 and 8 in 2016)   Regular exercise- yes   Eating a healthy diet   Spiritual: catholic   Social Determinants of Health   Financial Resource Strain:   . Difficulty of Paying Living Expenses:   Food Insecurity:   . Worried About Charity fundraiser in the Last Year:   . Arboriculturist in the Last Year:   Transportation Needs:   . Lexicographer (Medical):   Marland Kitchen Lack of Transportation (Non-Medical):   Physical Activity:   . Days of Exercise per Week:   . Minutes of Exercise per Session:   Stress:   . Feeling of Stress :   Social Connections:   . Frequency of Communication with Friends and Family:   . Frequency of Social Gatherings with Friends and Family:   . Attends Religious Services:   . Active Member of Clubs or Organizations:   . Attends Archivist Meetings:   Marland Kitchen Marital Status:   Intimate Partner Violence:   . Fear of Current or Ex-Partner:   . Emotionally Abused:   Marland Kitchen Physically Abused:   . Sexually Abused:     Review of Systems: 12 system ROS is negative except as noted above.   Physical Exam: General:   Alert,  well-nourished, pleasant and cooperative in NAD Head:  Normocephalic and atraumatic. Eyes:  Sclera clear, no icterus.   Conjunctiva pink. Ears:  Normal auditory acuity. Nose:  No deformity, discharge,  or lesions. Mouth:  No deformity or lesions.   Neck:  Supple; no masses or thyromegaly. Lungs:  Clear throughout to auscultation.   No wheezes. Heart:  Regular rate and rhythm; no murmurs. Abdomen:  Soft,nontender, nondistended, normal bowel sounds, no rebound or guarding. No hepatosplenomegaly.   Rectal:  Deferred  Msk:  Symmetrical. No boney deformities LAD: No inguinal or umbilical LAD Extremities:  No clubbing or edema. Neurologic:  Alert and  oriented x4;  grossly nonfocal Skin:  Intact without significant lesions or rashes. Psych:  Alert and cooperative. Normal mood and affect.    Kirstan Fentress L. Tarri Glenn, MD, MPH 05/27/2020, 1:21 PM

## 2020-05-27 NOTE — Telephone Encounter (Signed)
I received them. Thank you.

## 2020-05-27 NOTE — Telephone Encounter (Signed)
Thank you. I would like to opportunity to review the referral records prior to her appointment. Do you have them?

## 2020-05-27 NOTE — Telephone Encounter (Signed)
I missed that. I just spoke with her and she is on her way to the office, Thank you.

## 2020-05-27 NOTE — Telephone Encounter (Signed)
I could see her at 1:30pm today. Thank you.

## 2020-05-27 NOTE — Patient Instructions (Signed)
Your provider has requested that you go to the basement level for lab work before leaving today. Press "B" on the elevator. The lab is located at the first door on the left as you exit the elevator.  We will contact you after we get these results to set up an ERCP

## 2020-05-27 NOTE — Telephone Encounter (Signed)
Spoke with patient and let her know that Dr. Tarri Glenn has spoken with Dr. Lyndel Safe who is going to look at his schedule and find a time to do an ERCP.  Patient agreed.

## 2020-05-27 NOTE — Telephone Encounter (Signed)
Hi Dr. Tarri Glenn, we have received an urgent referral from [facility] for an ERCP. Records will be sent to you for review. I am copying Vaughan Basta so she is aware when patient needs to be scheduled. Thank you.

## 2020-05-27 NOTE — Telephone Encounter (Signed)
That 1:30 slot is open. The patient same day cancelled. Given the urgency, please see if she would prefer to come today. Thank you.

## 2020-05-27 NOTE — H&P (View-Only) (Signed)
Referring Provider: Isaac Bliss, [place] [facility]* Primary Care Physician:  Isaac Bliss, Rayford Halsted, MD  Reason for Consultation:  Do I need an ERCP?   IMPRESSION:  Intermittent, post-prandial RUQ pain, nausea, and vomiting Abnormal liver enzymes    - normal 11/17/2019 Abnormal gallbladder on CT suggesting cholecystitis Mild diffuse biliary ductal dilatation No stones seen on imaging Abnormal liver on MRI: Multiple small hepatic masses on contrasted CT 05/23/2020  Mild chronic gastritis without H pylori on EGD 12/01/18 Normal screening colonoscopy 12/01/18    - two small "polyps" were lymphoid nodules  Intermittent, postprandial abdominal pain, back pain, nausea, and vomiting with acutely elevated liver enzymes worrisome for obstructive jaundice. No fevers, chills, or night sweats.  Recent CT scan shows mild diffuse dilation of the biliary tree without stones, sludge, or stricture. The liver shows two lesions of unclear etiology that may be contributing to the lab abnormalities - ? Abscess, malignancy, benign hepatic tumor. Unfortunately, I confirmed with her plastic surgeon (assistant via phone)  that she cannot have an MRI while she has expanders in place and these will not be removed until July. Cefazolin is associated with drug-induced liver injury with associated nausea, abdominal pain, and pruritus, however, she has no fever, rash, or eosinophilia. Unusual presentation for [place].   Given the persistent elevation of the liver enzymes, will proceed with ERCP. Continue closely monitoring of liver enzymes. Plan MRI/MRCP to further clarify the liver masses when she is able to proceed.      PLAN: CMP, direct bilirubin, CBC - today ERCP - discussed with Dr. Lyndel Safe who agrees to perform the procedure MRI/MRCP when breast expanders are removed  Please see the "Patient Instructions" section for addition details about the plan.  HPI: Megan [name] is a 49 y.o. female referred by Dr. Marlou Starks for  further evaluation of abnormal liver enzymes and CT scan. The history is obtained through the patient, review of her electronic health record, and records provided by Dr. Marlou Starks. She was seen by me 10/14/2018 for reflux controlled on PPI therapy.   Bilateral nipple sparing mastectomies with reconstruction for ductal carcinoma in situ 04/08/20.  She currently has expanders in place. Received IV cefazolin preoperatively.    Developed generalized pruritus 04/17/20 that was temporally associated with Valium. Developed intermittent epigastric abdominal pain/back pain, nausea, and vomiting 05/01/20 followed by acholic stools, neon yellow urine and recurrent pruritus. Symptoms occur 3-4 hours after eating. Consistently improve with emesis within 2 hours. Symptoms are occuring weekly to 2-3 times weekly, and becoming from frequent. Last episode 05/22/20. Avoiding all greasy and fatty foods.   No response to hydrocodone, Zofran, or Benadryl.  No history of similar symptoms prior to mastectomies.   No alcohol. No other new medications. No fever, rash.   Liver enzymes were normal in December. Labs 05/15/2020 show a total bilirubin 3.3, direct bilirubin 1.5, alk phos 290, AST 81, ALT 170, albumin 4.7.  CBC was normal. No eosinophilia.  Labs 05/22/2020: TB 4.6, AST 78, ALT 157, alk phos 291 Labs 05/27/20: TB 3.5, DB 1.9, AST 106, ALT 224, alk phos 250  CT of the abdomen and pelvis with contrast 05/23/2020 was ordered by Dr. Isaac Bliss and showed a distended gallbladder with mild diffuse wall thickening suspicious for acute cholecystitis, mild diffuse biliary ductal dilatation with the CBD measuring 65m, and small hepatic masses (posterior right lobe measuring 3cm, lateral left lobe measuring 1.3 cm).  No gallstones seen. MRI recommended for further follow-up. Patient declined MRI due to  having breast tissue expanders and was told these could include metal.   Seen by Dr. Marlou Starks in follow-up. The patient reports that he is  planning a cholecystectomy but wanted her gallstones treated first.   The patient has had no prior abdominal imaging for comparison.   Chronic gastritis. Normal duodenal and  DC and TC polyps   Past Medical History:  Diagnosis Date  . Allergic rhinitis   . Allergy   . Family history of breast cancer   . Family history of colon cancer   . Family history of prostate cancer   . GERD (gastroesophageal reflux disease)   . MRSA infection 2017   left buttock  . SVD (spontaneous vaginal delivery)    x 1  . TB lung, latent    +  quantiferon, neg PPD - s/p treatment - completed    Past Surgical History:  Procedure Laterality Date  . BREAST BIOPSY Right 08/24/2012   benign  . BREAST RECONSTRUCTION WITH PLACEMENT OF TISSUE EXPANDER AND FLEX HD (ACELLULAR HYDRATED DERMIS) Bilateral 04/08/2020   Procedure: BILATERAL BREAST RECONSTRUCTION WITH PLACEMENT OF TISSUE EXPANDER AND FLEX HD (ACELLULAR HYDRATED DERMIS);  Surgeon: Wallace Going, DO;  Location: [place];  Service: Plastics;  Laterality: Bilateral;  . CESAREAN SECTION     x 1  . DILATION AND CURETTAGE OF UTERUS     x 2  . NIPPLE SPARING MASTECTOMY WITH SENTINEL LYMPH NODE BIOPSY Bilateral 04/08/2020   Procedure: RIGHT NIPPLE SPARING MASTECTOMY WITH RIGHT SENTINEL LYMPH NODE BIOPSY, LEFT PROPHYLACTIC MASTECTOMY;  Surgeon: Jovita Kussmaul, MD;  Location: [place];  Service: General;  Laterality: Bilateral;  . WISDOM TOOTH EXTRACTION      Current Outpatient Medications  Medication Sig Dispense Refill  . cetirizine (ZYRTEC) 10 MG tablet Take 10 mg by mouth daily.    . cyclobenzaprine (FLEXERIL) 5 MG tablet Take 1 tablet (5 mg total) by mouth 3 (three) times daily as needed for muscle spasms. 30 tablet 0  . esomeprazole (NEXIUM) 40 MG capsule Take 1 capsule (40 mg total) by mouth daily. 14 capsule 0  . fluticasone (FLONASE) 50 MCG/ACT nasal spray Place 2 sprays into both nostrils daily. 16 g 6  .  levonorgestrel (MIRENA) 20 MCG/24HR IUD 1 each by Intrauterine route once.    . ondansetron (ZOFRAN) 4 MG tablet Take 1 tablet (4 mg total) by mouth every 8 (eight) hours as needed for nausea or vomiting. 20 tablet 0   No current facility-administered medications for this visit.    Allergies as of 05/27/2020 - Review Complete 05/21/2020  Allergen Reaction Noted  . No known allergies  03/05/2020  . Valium [diazepam]  04/23/2020    Family History  Problem Relation Age of Onset  . Hypertension Mother   . Breast cancer Mother 80  . Hyperlipidemia Father   . Diabetes Father   . Crohn's disease Father   . Stroke Maternal Grandmother   . Diabetes Maternal Grandmother   . Prostate cancer Paternal Uncle   . Colon cancer Paternal Grandmother   . Breast cancer Other   . Rectal cancer Neg Hx   . Stomach cancer Neg Hx   . Esophageal cancer Neg Hx     Social History   Socioeconomic History  . Marital status: Married    Spouse name: Not on file  . Number of children: 2  . Years of education: Not on file  . Highest education level: Not on file  Occupational  History  . Occupation: Nurse Tourist information centre manager  Tobacco Use  . Smoking status: Never Smoker  . Smokeless tobacco: Never Used  Vaping Use  . Vaping Use: Never used  Substance and Sexual Activity  . Alcohol use: Not Currently    Comment: twice a year  . Drug use: No  . Sexual activity: Yes    Birth control/protection: I.U.D.    Comment: Mirena IUD-Inserted 2018  Other Topics Concern  . 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 Difficulty of Paying Living Expenses:   Food Insecurity:   . Worried About Charity fundraiser in the Last Year:   . Arboriculturist in the Last Year:   Transportation Needs:   . Lexicographer (Medical):   Marland Kitchen Lack of Transportation (Non-Medical):   Physical Activity:   . Days of Exercise per Week:   . Minutes of Exercise per Session:   Stress:   . Feeling of Stress :   Social Connections:   . Frequency of Communication with Friends and Family:   . Frequency of Social Gatherings with Friends and Family:   . Attends Religious Services:   . Active Member of Clubs or Organizations:   . Attends Archivist Meetings:   Marland Kitchen Marital Status:   Intimate Partner Violence:   . Fear of Current or Ex-Partner:   . Emotionally Abused:   Marland Kitchen Physically Abused:   . Sexually Abused:     Review of Systems: 12 system ROS is negative except as noted above.   Physical Exam: General:   Alert,  well-nourished, pleasant and cooperative in NAD Head:  Normocephalic and atraumatic. Eyes:  Sclera clear, no icterus.   Conjunctiva pink. Ears:  Normal auditory acuity. Nose:  No deformity, discharge,  or lesions. Mouth:  No deformity or lesions.   Neck:  Supple; no masses or thyromegaly. Lungs:  Clear throughout to auscultation.   No wheezes. Heart:  Regular rate and rhythm; no murmurs. Abdomen:  Soft,nontender, nondistended, normal bowel sounds, no rebound or guarding. No hepatosplenomegaly.   Rectal:  Deferred  Msk:  Symmetrical. No boney deformities LAD: No inguinal or umbilical LAD Extremities:  No clubbing or edema. Neurologic:  Alert and  oriented x4;  grossly nonfocal Skin:  Intact without significant lesions or rashes. Psych:  Alert and cooperative. Normal mood and affect.    Lathaniel Legate L. Tarri Glenn, MD, MPH 05/27/2020, 1:21 PM

## 2020-05-27 NOTE — Telephone Encounter (Signed)
I will look for those records.  

## 2020-05-27 NOTE — Telephone Encounter (Signed)
I think that Barbie Haggis put them on your desk. Please let me know if that is not the case. Thank you

## 2020-05-28 ENCOUNTER — Other Ambulatory Visit (HOSPITAL_COMMUNITY)
Admission: RE | Admit: 2020-05-28 | Discharge: 2020-05-28 | Disposition: A | Discharge status: Home / Self Care | Payer: 59 | Source: Ambulatory Visit | Attending: Gastroenterology | Admitting: Gastroenterology

## 2020-05-28 ENCOUNTER — Other Ambulatory Visit: Payer: Self-pay

## 2020-05-28 ENCOUNTER — Ambulatory Visit (INDEPENDENT_AMBULATORY_CARE_PROVIDER_SITE_OTHER): Payer: 59 | Admitting: Surgical

## 2020-05-28 ENCOUNTER — Encounter: Payer: Self-pay | Admitting: Surgical

## 2020-05-28 VITALS — BP 124/80 | HR 69 | Temp 98.0°F

## 2020-05-28 DIAGNOSIS — Z20822 Contact with and (suspected) exposure to covid-19: Secondary | ICD-10-CM | POA: Insufficient documentation

## 2020-05-28 DIAGNOSIS — Z01812 Encounter for preprocedural laboratory examination: Secondary | ICD-10-CM | POA: Diagnosis not present

## 2020-05-28 DIAGNOSIS — Z9889 Other specified postprocedural states: Secondary | ICD-10-CM

## 2020-05-28 DIAGNOSIS — Z170 Estrogen receptor positive status [ER+]: Secondary | ICD-10-CM

## 2020-05-28 DIAGNOSIS — C50411 Malignant neoplasm of upper-outer quadrant of right female breast: Secondary | ICD-10-CM

## 2020-05-28 LAB — SARS CORONAVIRUS 2 (TAT 6-24 HRS): SARS Coronavirus 2: NEGATIVE

## 2020-05-28 NOTE — Progress Notes (Signed)
   Subjective:     Patient ID: Megan [name], female    DOB: 1971/09/04, 49 y.o.   MRN: [id number]  Chief Complaint  Patient presents with  . Follow-up    HPI: The patient is a 49 y.o. female here for follow-up on her bilateral breast reconstruction.  She has bilateral inframammary fold wounds, at her last visit we applied donated ACell.  This is seem to help.  She has otherwise been doing Xeroform dressing changes daily.  She is having some issues with her gallbladder and is scheduled for an ERCP this Thursday as well as a cholecystectomy in the near future.  She also had a CT scan to further evaluate the gallbladder issues and it was noted on exam that she had some hepatic masses and it was recommended to have an MRI.  Unfortunately due to patient having expanders with metal ports in place she is unable to have an MRI at this time.   Due to the abdominal pain likely related to her gallbladder she is having difficulty with eating and has been on strict diet of grilled chicken and salads.  She has also tried some protein shakes which have been somewhat tolerable.  Review of Systems  Constitutional: Positive for activity change and appetite change.  Skin: Positive for wound.     Objective:   Vital Signs BP 124/80 (BP Location: Left Arm, Patient Position: Sitting, Cuff Size: Normal)   Pulse 69   Temp 98 F (36.7 C) (Temporal)   SpO2 100%  Vital Signs and Nursing Note Reviewed Chaperone present Physical Exam Constitutional:      Appearance: Normal appearance.  Pulmonary:     Effort: Pulmonary effort is normal.  Chest:       Comments: Improvement in bilateral inframammary fold wounds noted, no expander exposure noted.  No periwound erythema.  No purulent or foul drainage noted.  No erythema. Neurological:     General: No focal deficit present.     Mental Status: She is alert and oriented to person, place, and time.       Assessment/Plan:     ICD-10-CM   1. S/P breast  reconstruction, bilateral  Z98.890   2. Malignant neoplasm of upper-outer quadrant of right breast in female, estrogen receptor positive ([place])  C50.411    Z17.0     Donated ACell applied to bilateral inframammary fold wound.  Recommend daily K-Y jelly to Adaptic mesh.  In 4 days she can shower and then began applying Xeroform gauze daily.  We placed injectable saline in the Expander using a sterile technique: Right: 20 cc for a total of 300/535 cc Left: 20 cc for a total of 300 / 535 cc  Pictures were obtained of the patient and placed in the chart with the patient's or guardian's permission.  Call with any questions or concerns, follow-up in 2 weeks for reevaluation.   Carola Rhine Avree Szczygiel, PA-C 05/28/2020, 3:57 PM

## 2020-05-29 ENCOUNTER — Ambulatory Visit: Payer: 59 | Admitting: Gastroenterology

## 2020-05-29 ENCOUNTER — Other Ambulatory Visit: Payer: Self-pay

## 2020-05-29 ENCOUNTER — Telehealth: Payer: Self-pay

## 2020-05-29 DIAGNOSIS — K831 Obstruction of bile duct: Secondary | ICD-10-CM

## 2020-05-29 DIAGNOSIS — K8050 Calculus of bile duct without cholangitis or cholecystitis without obstruction: Secondary | ICD-10-CM

## 2020-05-29 NOTE — Telephone Encounter (Signed)
Called patient to let her know that she needs to have labs drawn BEFORE procedure on 05/30/20. Patient stated that she was able to get pre-op instructions from my chart.  She verbalized understanding.

## 2020-05-30 ENCOUNTER — Ambulatory Visit (HOSPITAL_COMMUNITY): Payer: 59

## 2020-05-30 ENCOUNTER — Ambulatory Visit (HOSPITAL_COMMUNITY)
Admission: RE | Admit: 2020-05-30 | Discharge: 2020-05-30 | Disposition: A | Discharge status: Home / Self Care | Payer: 59 | Attending: Gastroenterology | Admitting: Gastroenterology

## 2020-05-30 ENCOUNTER — Ambulatory Visit (HOSPITAL_COMMUNITY): Payer: 59 | Admitting: Certified Registered Nurse Anesthetist

## 2020-05-30 ENCOUNTER — Encounter (HOSPITAL_COMMUNITY)
Admission: RE | Disposition: A | Discharge status: Home / Self Care | Payer: Self-pay | Source: Home / Self Care | Attending: Gastroenterology

## 2020-05-30 ENCOUNTER — Other Ambulatory Visit (INDEPENDENT_AMBULATORY_CARE_PROVIDER_SITE_OTHER): Payer: 59

## 2020-05-30 ENCOUNTER — Encounter (HOSPITAL_COMMUNITY): Payer: Self-pay | Admitting: Gastroenterology

## 2020-05-30 ENCOUNTER — Other Ambulatory Visit: Payer: Self-pay

## 2020-05-30 DIAGNOSIS — D0510 Intraductal carcinoma in situ of unspecified breast: Secondary | ICD-10-CM | POA: Insufficient documentation

## 2020-05-30 DIAGNOSIS — Z975 Presence of (intrauterine) contraceptive device: Secondary | ICD-10-CM | POA: Insufficient documentation

## 2020-05-30 DIAGNOSIS — K831 Obstruction of bile duct: Secondary | ICD-10-CM

## 2020-05-30 DIAGNOSIS — K8050 Calculus of bile duct without cholangitis or cholecystitis without obstruction: Secondary | ICD-10-CM | POA: Diagnosis not present

## 2020-05-30 DIAGNOSIS — K8070 Calculus of gallbladder and bile duct without cholecystitis without obstruction: Secondary | ICD-10-CM | POA: Diagnosis not present

## 2020-05-30 DIAGNOSIS — Z793 Long term (current) use of hormonal contraceptives: Secondary | ICD-10-CM | POA: Insufficient documentation

## 2020-05-30 DIAGNOSIS — K219 Gastro-esophageal reflux disease without esophagitis: Secondary | ICD-10-CM | POA: Diagnosis not present

## 2020-05-30 DIAGNOSIS — E785 Hyperlipidemia, unspecified: Secondary | ICD-10-CM | POA: Diagnosis not present

## 2020-05-30 DIAGNOSIS — Z79899 Other long term (current) drug therapy: Secondary | ICD-10-CM | POA: Diagnosis not present

## 2020-05-30 DIAGNOSIS — Z8719 Personal history of other diseases of the digestive system: Secondary | ICD-10-CM | POA: Insufficient documentation

## 2020-05-30 DIAGNOSIS — K8051 Calculus of bile duct without cholangitis or cholecystitis with obstruction: Secondary | ICD-10-CM | POA: Diagnosis not present

## 2020-05-30 DIAGNOSIS — K8020 Calculus of gallbladder without cholecystitis without obstruction: Secondary | ICD-10-CM

## 2020-05-30 DIAGNOSIS — Z803 Family history of malignant neoplasm of breast: Secondary | ICD-10-CM | POA: Diagnosis not present

## 2020-05-30 DIAGNOSIS — K838 Other specified diseases of biliary tract: Secondary | ICD-10-CM | POA: Diagnosis not present

## 2020-05-30 DIAGNOSIS — Z9013 Acquired absence of bilateral breasts and nipples: Secondary | ICD-10-CM | POA: Insufficient documentation

## 2020-05-30 DIAGNOSIS — J309 Allergic rhinitis, unspecified: Secondary | ICD-10-CM | POA: Diagnosis not present

## 2020-05-30 HISTORY — PX: BILIARY DILATION: SHX6850

## 2020-05-30 HISTORY — PX: SPHINCTEROTOMY: SHX5544

## 2020-05-30 HISTORY — PX: ENDOSCOPIC RETROGRADE CHOLANGIOPANCREATOGRAPHY (ERCP) WITH PROPOFOL: SHX5810

## 2020-05-30 HISTORY — PX: REMOVAL OF STONES: SHX5545

## 2020-05-30 LAB — CBC WITH DIFFERENTIAL/PLATELET
Basophils Absolute: 0.1 10*3/uL (ref 0.0–0.1)
Basophils Relative: 1.2 % (ref 0.0–3.0)
Eosinophils Absolute: 0.2 10*3/uL (ref 0.0–0.7)
Eosinophils Relative: 2.7 % (ref 0.0–5.0)
HCT: 40.4 % (ref 36.0–46.0)
Hemoglobin: 13.7 g/dL (ref 12.0–15.0)
Lymphocytes Relative: 25.9 % (ref 12.0–46.0)
Lymphs Abs: 1.6 10*3/uL (ref 0.7–4.0)
MCHC: 34 g/dL (ref 30.0–36.0)
MCV: 94.3 fl (ref 78.0–100.0)
Monocytes Absolute: 0.6 10*3/uL (ref 0.1–1.0)
Monocytes Relative: 10.4 % (ref 3.0–12.0)
Neutro Abs: 3.6 10*3/uL (ref 1.4–7.7)
Neutrophils Relative %: 59.8 % (ref 43.0–77.0)
Platelets: 320 10*3/uL (ref 150.0–400.0)
RBC: 4.28 Mil/uL (ref 3.87–5.11)
RDW: 13.1 % (ref 11.5–15.5)
WBC: 6.1 10*3/uL (ref 4.0–10.5)

## 2020-05-30 LAB — COMPREHENSIVE METABOLIC PANEL
ALT: 269 U/L — ABNORMAL HIGH (ref 0–35)
AST: 139 U/L — ABNORMAL HIGH (ref 0–37)
Albumin: 4.6 g/dL (ref 3.5–5.2)
Alkaline Phosphatase: 299 U/L — ABNORMAL HIGH (ref 39–117)
BUN: 11 mg/dL (ref 6–23)
CO2: 25 mEq/L (ref 19–32)
Calcium: 9.8 mg/dL (ref 8.4–10.5)
Chloride: 105 mEq/L (ref 96–112)
Creatinine, Ser: 0.71 mg/dL (ref 0.40–1.20)
GFR: 88.34 mL/min (ref >60.00)
Glucose, Bld: 136 mg/dL — ABNORMAL HIGH (ref 70–99)
Potassium: 3.6 mEq/L (ref 3.5–5.1)
Sodium: 136 mEq/L (ref 135–145)
Total Bilirubin: 5.1 mg/dL — ABNORMAL HIGH (ref 0.2–1.2)
Total Protein: 7.9 g/dL (ref 6.0–8.3)

## 2020-05-30 LAB — LIPASE: Lipase: 35 U/L (ref 11.0–59.0)

## 2020-05-30 LAB — PREGNANCY, URINE: Preg Test, Ur: NEGATIVE

## 2020-05-30 SURGERY — ENDOSCOPIC RETROGRADE CHOLANGIOPANCREATOGRAPHY (ERCP) WITH PROPOFOL
Anesthesia: General

## 2020-05-30 MED ORDER — GLUCAGON HCL RDNA (DIAGNOSTIC) 1 MG IJ SOLR
INTRAMUSCULAR | Status: AC
  Filled 2020-05-30: qty 1

## 2020-05-30 MED ORDER — INDOMETHACIN 50 MG RE SUPP
RECTAL | Status: DC | PRN
  Administered 2020-05-30: 100 mg via RECTAL

## 2020-05-30 MED ORDER — SUCCINYLCHOLINE CHLORIDE 200 MG/10ML IV SOSY
PREFILLED_SYRINGE | INTRAVENOUS | Status: DC | PRN
  Administered 2020-05-30: 120 mg via INTRAVENOUS

## 2020-05-30 MED ORDER — FENTANYL CITRATE (PF) 100 MCG/2ML IJ SOLN
INTRAMUSCULAR | Status: DC | PRN
  Administered 2020-05-30 (×2): 50 ug via INTRAVENOUS

## 2020-05-30 MED ORDER — INDOMETHACIN 50 MG RE SUPP
RECTAL | Status: AC
  Filled 2020-05-30: qty 2

## 2020-05-30 MED ORDER — MIDAZOLAM HCL 5 MG/5ML IJ SOLN
INTRAMUSCULAR | Status: DC | PRN
  Administered 2020-05-30: 2 mg via INTRAVENOUS

## 2020-05-30 MED ORDER — SODIUM CHLORIDE 0.9 % IV SOLN
1.5000 g | Freq: Once | INTRAVENOUS | Status: AC
  Administered 2020-05-30: 1.5 g via INTRAVENOUS
  Filled 2020-05-30: qty 1.5

## 2020-05-30 MED ORDER — DEXAMETHASONE SODIUM PHOSPHATE 10 MG/ML IJ SOLN
INTRAMUSCULAR | Status: DC | PRN
  Administered 2020-05-30: 6 mg via INTRAVENOUS

## 2020-05-30 MED ORDER — SODIUM CHLORIDE 0.9 % IV SOLN: INTRAVENOUS | Status: DC

## 2020-05-30 MED ORDER — PROPOFOL 10 MG/ML IV BOLUS
INTRAVENOUS | Status: DC | PRN
  Administered 2020-05-30: 150 mg via INTRAVENOUS

## 2020-05-30 MED ORDER — GLUCAGON HCL RDNA (DIAGNOSTIC) 1 MG IJ SOLR
INTRAMUSCULAR | Status: DC | PRN
  Administered 2020-05-30 (×2): .2 mg via INTRAVENOUS

## 2020-05-30 MED ORDER — INDOMETHACIN 50 MG RE SUPP: 100.0000 mg | Freq: Once | RECTAL | Status: DC

## 2020-05-30 MED ORDER — ONDANSETRON HCL 4 MG/2ML IJ SOLN
INTRAMUSCULAR | Status: DC | PRN
  Administered 2020-05-30: 4 mg via INTRAVENOUS

## 2020-05-30 MED ORDER — SODIUM CHLORIDE 0.9 % IV SOLN
INTRAVENOUS | Status: DC | PRN
  Administered 2020-05-30: 42 mL

## 2020-05-30 MED ORDER — LACTATED RINGERS IV SOLN: INTRAVENOUS | Status: DC

## 2020-05-30 MED ORDER — SUGAMMADEX SODIUM 200 MG/2ML IV SOLN
INTRAVENOUS | Status: DC | PRN
  Administered 2020-05-30: 140 mg via INTRAVENOUS

## 2020-05-30 MED ORDER — ROCURONIUM BROMIDE 10 MG/ML (PF) SYRINGE
PREFILLED_SYRINGE | INTRAVENOUS | Status: DC | PRN
  Administered 2020-05-30: 10 mg via INTRAVENOUS

## 2020-05-30 MED ORDER — MIDAZOLAM HCL 2 MG/2ML IJ SOLN
INTRAMUSCULAR | Status: AC
  Filled 2020-05-30: qty 2

## 2020-05-30 MED ORDER — LIDOCAINE 2% (20 MG/ML) 5 ML SYRINGE
INTRAMUSCULAR | Status: DC | PRN
  Administered 2020-05-30: 80 mg via INTRAVENOUS

## 2020-05-30 MED ORDER — FENTANYL CITRATE (PF) 100 MCG/2ML IJ SOLN
INTRAMUSCULAR | Status: AC
  Filled 2020-05-30: qty 2

## 2020-05-30 NOTE — Anesthesia Preprocedure Evaluation (Signed)
Anesthesia Evaluation  Patient identified by MRN, date of birth, ID band Patient awake    Airway Mallampati: I       Dental no notable dental hx. (+) Teeth Intact   Pulmonary neg pulmonary ROS,    Pulmonary exam normal breath sounds clear to auscultation       Cardiovascular negative cardio ROS Normal cardiovascular exam Rhythm:Regular Rate:Normal     Neuro/Psych negative neurological ROS  negative psych ROS   GI/Hepatic Neg liver ROS, GERD  Medicated and Controlled,  Endo/Other  negative endocrine ROS  Renal/GU negative Renal ROS  negative genitourinary   Musculoskeletal negative musculoskeletal ROS (+)   Abdominal Normal abdominal exam  (+)   Peds  Hematology negative hematology ROS (+)   Anesthesia Other Findings   Reproductive/Obstetrics                             Anesthesia Physical Anesthesia Plan  ASA: II  Anesthesia Plan: General   Post-op Pain Management:    Induction: Intravenous  PONV Risk Score and Plan: Ondansetron, Dexamethasone and Midazolam  Airway Management Planned: Oral ETT  Additional Equipment: None  Intra-op Plan:   Post-operative Plan:   Informed Consent: I have reviewed the patients History and Physical, chart, labs and discussed the procedure including the risks, benefits and alternatives for the proposed anesthesia with the patient or authorized representative who has indicated his/her understanding and acceptance.     Dental advisory given  Plan Discussed with: CRNA  Anesthesia Plan Comments:         Anesthesia Quick Evaluation

## 2020-05-30 NOTE — Transfer of Care (Signed)
Immediate Anesthesia Transfer of Care Note  Patient: Megan [name]  Procedure(s) Performed: ENDOSCOPIC RETROGRADE CHOLANGIOPANCREATOGRAPHY (ERCP) WITH PROPOFOL (N/A ) SPHINCTEROTOMY REMOVAL OF STONES BILIARY DILATION  Patient Location: PACU  Anesthesia Type:General  Level of Consciousness: awake, alert , oriented and patient cooperative  Airway & Oxygen Therapy: Patient Spontanous Breathing and Patient connected to face mask oxygen  Post-op Assessment: Report given to RN and Post -op Vital signs reviewed and stable  Post vital signs: Reviewed and stable  Last Vitals:  Vitals Value Taken Time  BP 162/76 05/30/20 1434  Temp    Pulse 71 05/30/20 1437  Resp 17 05/30/20 1437  SpO2 100 % 05/30/20 1437  Vitals shown include unvalidated device data.  Last Pain:  Vitals:   05/30/20 1135  TempSrc: Oral  PainSc: 0-No pain         Complications: No complications documented.

## 2020-05-30 NOTE — Interval H&P Note (Signed)
History and Physical Interval Note:  05/30/2020 12:48 PM  Megan [name]  has presented today for surgery, with the diagnosis of CBD stones, obstructive jaundice.  The various methods of treatment have been discussed with the patient and family. After consideration of risks, benefits and other options for treatment, the patient has consented to  Procedure(s): ENDOSCOPIC RETROGRADE CHOLANGIOPANCREATOGRAPHY (ERCP) WITH PROPOFOL (N/A) as a surgical intervention.  The patient's history has been reviewed, patient examined, no change in status, stable for surgery.  I have reviewed the patient's chart and labs.  Questions were answered to the patient's satisfaction.     [name]

## 2020-05-30 NOTE — Discharge Instructions (Signed)
YOU HAD AN ENDOSCOPIC PROCEDURE TODAY: Refer to the procedure report and other information in the discharge instructions given to you for any specific questions about what was found during the examination. If this information does not answer your questions, please call [place] office at [phone] to clarify.   YOU SHOULD EXPECT: Some feelings of bloating in the abdomen. Passage of more gas than usual. Walking can help get rid of the air that was put into your GI tract during the procedure and reduce the bloating. If you had a lower endoscopy (such as a colonoscopy or flexible sigmoidoscopy) you may notice spotting of blood in your stool or on the toilet paper. Some abdominal soreness may be present for a day or two, also.  DIET: Your first meal following the procedure should be a light meal and then it is ok to progress to your normal diet. A half-sandwich or bowl of soup is an example of a good first meal. Heavy or fried foods are harder to digest and may make you feel nauseous or bloated. Drink plenty of fluids but you should avoid alcoholic beverages for 24 hours. If you had a esophageal dilation, please see attached instructions for diet.    ACTIVITY: Your care partner should take you home directly after the procedure. You should plan to take it easy, moving slowly for the rest of the day. You can resume normal activity the day after the procedure however YOU SHOULD NOT DRIVE, use power tools, machinery or perform tasks that involve climbing or major physical exertion for 24 hours (because of the sedation medicines used during the test).   SYMPTOMS TO REPORT IMMEDIATELY: A gastroenterologist can be reached at any hour. Please call [phone]  for any of the following symptoms:   Following upper endoscopy (EGD, EUS, ERCP, esophageal dilation) Vomiting of blood or coffee ground material  New, significant abdominal pain  New, significant chest pain or pain under the shoulder blades  Painful or  persistently difficult swallowing  New shortness of breath  Black, tarry-looking or red, bloody stools  FOLLOW UP:  If any biopsies were taken you will be contacted by phone or by letter within the next 1-3 weeks. Call [phone]  if you have not heard about the biopsies in 3 weeks.  Please also call with any specific questions about appointments or follow up tests.  

## 2020-05-30 NOTE — Op Note (Signed)
[facility] Patient Name: Megan [name] Procedure Date: 05/30/2020 MRN: [id number] Attending MD: [name] , MD Date of Birth: 01/03/1971 CSN: [id number] Age: 49 Admit Type: Outpatient Procedure:                ERCP Indications:              Obstructive jaundice with biliary colic. CT scan                            showing dilated common bile duct/thickened                            gallbladder. Patient has been seen by Dr. Marlou Starks.                            Recommended preoperative ERCP. Providers:                [name], MD, Benetta Spar RN, RN, Corie Chiquito, Technician, Christell Faith, CRNA Referring MD:              Medicines:                General Anesthesia, Unasyn 1.5 g, glucagon 0.4 mg,                            Indocin suppositories. Complications:            No immediate complications. Estimated Blood Loss:     Estimated blood loss: none. Procedure:                Pre-Anesthesia Assessment:                           - Prior to the procedure, a History and Physical                            was performed, and patient medications and                            allergies were reviewed. The patient's tolerance of                            previous anesthesia was also reviewed. The risks                            and benefits of the procedure and the sedation                            options and risks were discussed with the patient.                            All questions were answered, and informed consent  was obtained. Prior Anticoagulants: The patient has                            taken no previous anticoagulant or antiplatelet                            agents. ASA Grade Assessment: II - A patient with                            mild systemic disease. After reviewing the risks                            and benefits, the patient was deemed in                            satisfactory condition  to undergo the procedure.                           After obtaining informed consent, the scope was                            passed under direct vision. Throughout the                            procedure, the patient's blood pressure, pulse, and                            oxygen saturations were monitored continuously. The                            TJF-Q180V ([id number] Olympus Duodenoscope was                            introduced through the mouth, and used to inject                            contrast into and used to inject contrast into the                            bile duct. The ERCP was accomplished without                            difficulty. The patient tolerated the procedure                            well. Scope In: Scope Out: Findings:      The scout film was normal. The esophagus was successfully intubated       under direct vision. The scope was advanced to the descending duodenum       without detailed examination of the pharynx, larynx and associated       structures, and upper GI tract. The upper GI tract was grossly normal.       Major papilla was normal. A 1 cm periampullary diverticulum was noted.  The bile duct was deeply cannulated with the short-nosed traction       sphincterotome. Contrast was injected. I personally interpreted the bile       duct images. Ductal flow of contrast was adequate. Image quality was       excellent. Contrast extended to the entire biliary tree.      A single 10 mm filling defect consistent with choledocholithiasis was       found in dilated CBD (appox 12 mm). The entire biliary tree including       common hepatic duct, right and left hepatic ducts were dilated. Cystic       duct did fill on postocclusion cholangiogram. There were multiple       filling defects in the gallbladder consistent with cholelithiasis. A 6       mm biliary sphincterotomy was made with a monofilament traction       (standard) sphincterotome using ERBE  endocut mode at 12 o'clock       position. There was no post-sphincterotomy bleeding. D/t presence of       diverticulum, we elected to perform biliary sphincteroplasty. The       ampulla was dilated using 09-24-11 mm CRE balloon (to a maximum balloon       size of 11 mm) x 1 min each. The biliary tree was swept with a 12-15 mm       balloon starting at the bifurcation. A single 1 cm mulberry yellowish       stone was removed (see endoscopic pictures). No stones remained.       Postocclusion cholangiogram did not reveal any other filling defects.      Pancreatic duct was intentionally not cannulated. Impression:               - Choledocholithiasis s/p biliary sphincterotomy,                            sphincteroplasty and balloon extraction.                           - Periampullary diverticulum.                           - Cholelithiasis. Moderate Sedation:      Not Applicable - Patient had care per Anesthesia. Recommendation:           - Patient has a contact number available for                            emergencies. The signs and symptoms of potential                            delayed complications were discussed with the                            patient. Return to normal activities tomorrow.                            Written discharge instructions were provided to the                            patient.                           -  Watch for pancreatitis, bleeding, perforation,                            and cholangitis.                           - Avoid aspirin and nonsteroidal anti-inflammatory                            medicines for 5 days.                           - Clear liquid diet today. Advance to low-fat diet                            in AM.                           - The findings and recommendations were discussed                            with the patient's husband Megan [name] [phone])                           - Refer back to Dr. Autumn Messing for laparoscopic                             cholecystectomy.                           - D/W Dr. Tarri Glenn. Procedure Code(s):        --- Professional ---                           [phone], 41, Endoscopic retrograde                            cholangiopancreatography (ERCP); with                            trans-endoscopic balloon dilation of                            biliary/pancreatic duct(s) or of ampulla                            (sphincteroplasty), including sphincterotomy, when                            performed, each duct                           [id number], Endoscopic retrograde                            cholangiopancreatography (ERCP); with removal of  calculi/debris from biliary/pancreatic duct(s)                           [phone], Endoscopic catheterization of the biliary                            ductal system, radiological supervision and                            interpretation Diagnosis Code(s):        --- Professional ---                           K80.51, Calculus of bile duct without cholangitis                            or cholecystitis with obstruction CPT copyright 2019 [employer]. All rights reserved. The codes documented in this report are preliminary and upon coder review may  be revised to meet current compliance requirements. [name], MD 05/30/2020 2:43:34 PM This report has been signed electronically. Number of Addenda: 0

## 2020-05-30 NOTE — Anesthesia Procedure Notes (Signed)
Procedure Name: Intubation Date/Time: 05/30/2020 1:22 PM Performed by: West Pugh, CRNA Pre-anesthesia Checklist: Patient identified, Emergency Drugs available, Suction available, Patient being monitored and Timeout performed Patient Re-evaluated:Patient Re-evaluated prior to induction Oxygen Delivery Method: Circle system utilized Preoxygenation: Pre-oxygenation with 100% oxygen Induction Type: IV induction Ventilation: Mask ventilation without difficulty Laryngoscope Size: Mac and 3 Grade View: Grade I Tube type: Oral Tube size: 7.0 mm Number of attempts: 1 Airway Equipment and Method: Stylet Placement Confirmation: ETT inserted through vocal cords under direct vision,  positive ETCO2,  CO2 detector and breath sounds checked- equal and bilateral Secured at: 22 cm Tube secured with: Tape Dental Injury: Teeth and Oropharynx as per pre-operative assessment  Comments: AOI. Bite block placed by Endo RN.

## 2020-05-31 LAB — IGG 4: IgG, Subclass 4: 17 mg/dL (ref 2–96)

## 2020-05-31 NOTE — Progress Notes (Signed)
POST ERCP NOTE:  Called patient over the phone  Pt doing well. No abdominal pain or nausea or melena.  No fever or chills. Tolerating p.o. well  She will call Dr. Ethlyn Gallery office on Monday to schedule lap cholecystectomy.  Carmell Austria

## 2020-06-04 ENCOUNTER — Ambulatory Visit: Payer: Self-pay | Admitting: General Surgery

## 2020-06-10 NOTE — Anesthesia Postprocedure Evaluation (Signed)
Anesthesia Post Note  Patient: Megan [name]  Procedure(s) Performed: ENDOSCOPIC RETROGRADE CHOLANGIOPANCREATOGRAPHY (ERCP) WITH PROPOFOL (N/A ) SPHINCTEROTOMY REMOVAL OF STONES BILIARY DILATION     Patient location during evaluation: Endoscopy Anesthesia Type: General Level of consciousness: awake Pain management: pain level controlled Vital Signs Assessment: post-procedure vital signs reviewed and stable Respiratory status: spontaneous breathing Cardiovascular status: stable Postop Assessment: no apparent nausea or vomiting Anesthetic complications: no   No complications documented.  Last Vitals:  Vitals:   05/30/20 1440 05/30/20 1450  BP: (!) 143/80 (!) 153/75  Pulse: 72 69  Resp: 18 14  Temp:    SpO2: 98% 98%    Last Pain:  Vitals:   05/31/20 1348  TempSrc:   PainSc: 0-No pain                 Huston Foley

## 2020-06-11 ENCOUNTER — Other Ambulatory Visit (HOSPITAL_COMMUNITY)
Admission: RE | Admit: 2020-06-11 | Discharge: 2020-06-11 | Disposition: A | Discharge status: Home / Self Care | Payer: 59 | Source: Ambulatory Visit | Attending: General Surgery | Admitting: General Surgery

## 2020-06-11 ENCOUNTER — Ambulatory Visit (INDEPENDENT_AMBULATORY_CARE_PROVIDER_SITE_OTHER): Payer: 59 | Admitting: Surgical

## 2020-06-11 ENCOUNTER — Other Ambulatory Visit: Payer: Self-pay

## 2020-06-11 ENCOUNTER — Encounter (HOSPITAL_COMMUNITY): Payer: Self-pay

## 2020-06-11 ENCOUNTER — Other Ambulatory Visit (HOSPITAL_COMMUNITY): Payer: 59

## 2020-06-11 ENCOUNTER — Encounter: Payer: Self-pay | Admitting: Surgical

## 2020-06-11 ENCOUNTER — Encounter (HOSPITAL_COMMUNITY)
Admission: RE | Admit: 2020-06-11 | Discharge: 2020-06-11 | Disposition: A | Discharge status: Home / Self Care | Payer: 59 | Source: Ambulatory Visit | Attending: General Surgery | Admitting: General Surgery

## 2020-06-11 VITALS — BP 134/77 | HR 76 | Temp 98.6°F

## 2020-06-11 DIAGNOSIS — Z01812 Encounter for preprocedural laboratory examination: Secondary | ICD-10-CM | POA: Insufficient documentation

## 2020-06-11 DIAGNOSIS — Z9889 Other specified postprocedural states: Secondary | ICD-10-CM

## 2020-06-11 DIAGNOSIS — C50411 Malignant neoplasm of upper-outer quadrant of right female breast: Secondary | ICD-10-CM

## 2020-06-11 DIAGNOSIS — Z20822 Contact with and (suspected) exposure to covid-19: Secondary | ICD-10-CM | POA: Insufficient documentation

## 2020-06-11 DIAGNOSIS — Z170 Estrogen receptor positive status [ER+]: Secondary | ICD-10-CM

## 2020-06-11 LAB — COMPREHENSIVE METABOLIC PANEL
ALT: 74 U/L — ABNORMAL HIGH (ref 0–44)
AST: 45 U/L — ABNORMAL HIGH (ref 15–41)
Albumin: 3.9 g/dL (ref 3.5–5.0)
Alkaline Phosphatase: 183 U/L — ABNORMAL HIGH (ref 38–126)
Anion gap: 8 (ref 5–15)
BUN: 8 mg/dL (ref 6–20)
CO2: 26 mmol/L (ref 22–32)
Calcium: 9.1 mg/dL (ref 8.9–10.3)
Chloride: 107 mmol/L (ref 98–111)
Creatinine, Ser: 0.7 mg/dL (ref 0.44–1.00)
GFR calc Af Amer: >60 mL/min (ref >60)
GFR calc non Af Amer: >60 mL/min (ref >60)
Glucose, Bld: 149 mg/dL — ABNORMAL HIGH (ref 70–99)
Potassium: 4.2 mmol/L (ref 3.5–5.1)
Sodium: 141 mmol/L (ref 135–145)
Total Bilirubin: 1.1 mg/dL (ref 0.3–1.2)
Total Protein: 6.8 g/dL (ref 6.5–8.1)

## 2020-06-11 LAB — CBC
HCT: 36.6 % (ref 36.0–46.0)
Hemoglobin: 11.8 g/dL — ABNORMAL LOW (ref 12.0–15.0)
MCH: 31.2 pg (ref 26.0–34.0)
MCHC: 32.2 g/dL (ref 30.0–36.0)
MCV: 96.8 fL (ref 80.0–100.0)
Platelets: 326 10*3/uL (ref 150–400)
RBC: 3.78 MIL/uL — ABNORMAL LOW (ref 3.87–5.11)
RDW: 12.8 % (ref 11.5–15.5)
WBC: 8.9 10*3/uL (ref 4.0–10.5)
nRBC: 0 % (ref 0.0–0.2)

## 2020-06-11 LAB — SARS CORONAVIRUS 2 (TAT 6-24 HRS): SARS Coronavirus 2: NEGATIVE

## 2020-06-11 LAB — SURGICAL PCR SCREEN
MRSA, PCR: NEGATIVE
Staphylococcus aureus: NEGATIVE

## 2020-06-11 MED ORDER — CHLORHEXIDINE GLUCONATE CLOTH 2 % EX PADS: 6.0000 | MEDICATED_PAD | Freq: Once | CUTANEOUS | Status: DC

## 2020-06-11 NOTE — Progress Notes (Signed)
   Subjective:     Patient ID: Megan [name], female    DOB: 1971-05-11, 49 y.o.   MRN: [id number]  Chief Complaint  Patient presents with  . Follow-up    HPI: The patient is a 49 y.o. female here for follow-up on her bilateral breast reconstruction. She is doing well, she recently underwent ERCP and is feeling much better now. She is eating better and has noticed a significant improvement in her bilateral IMF wounds since ERCP. She is very pleased with her progress. She has been applying vaseline, xeroform and gauze.   Review of Systems  Constitutional: Positive for activity change.  Skin: Positive for wound.     Objective:   Vital Signs BP 134/77 (BP Location: Left Arm, Patient Position: Sitting, Cuff Size: Normal)   Pulse 76   Temp 98.6 F (37 C) (Temporal)   SpO2 99%  Vital Signs and Nursing Note Reviewed Chaperone present Physical Exam HENT:     Head: Normocephalic and atraumatic.  Pulmonary:     Effort: Pulmonary effort is normal.  Chest:    Skin:    General: Skin is warm and dry.  Neurological:     Mental Status: She is alert.  Psychiatric:        Mood and Affect: Mood normal.        Behavior: Behavior normal.       Assessment/Plan:     ICD-10-CM   1. S/P breast reconstruction, bilateral  Z98.890   2. Malignant neoplasm of upper-outer quadrant of right breast in female, estrogen receptor positive ([place])  C50.411    Z17.0    Continue with daily dressing changes to L breast wound - vaseline, xeroform, 4x4 gauze. Apply vaseline daily to right breast scab.  We placed injectable saline in the Expander using a sterile technique: Right: 40 cc for a total of 340 / 535 cc Left: 40 cc for a total of 340 / 535 cc  Patient would like to begin planning for exchange surgery, late July/early august. She is comfortable with the current size, may need 1 more fill.   Pictures were obtained of the patient and placed in the chart with the patient's or guardian's  permission.  Follow up scheduled for 2 weeks with Dr. Leretha Pol Cythia Bachtel, PA-C 06/11/2020, 4:26 PM

## 2020-06-11 NOTE — Progress Notes (Signed)
PCP - A. ACOSTA Cardiologist - NA  s - EKG - NA Stress Test - NA ECHO - NA Cardiac Cath -NA       Aspirin Instructions:STOP  ERAS Protcol -YES      FLUID INSTRUCTIONS GIVEN   COVID TEST- DONE TODAY   Anesthesia review: PAST EXPOSURE TO TB.NEG CXR  Patient denies shortness of breath, fever, cough and chest pain at PAT appointment   All instructions explained to the patient, with a verbal understanding of the material. Patient agrees to go over the instructions while at home for a better understanding. Patient also instructed to self quarantine after being tested for COVID-19. The opportunity to ask questions was provided.

## 2020-06-11 NOTE — Pre-Procedure Instructions (Signed)
DENEE BOEDER  06/11/2020      [place], [place] - [address] [place] [address] Phone: [phone] Fax: [phone]    Your procedure is scheduled on 06/14/20.  Report to [facility] Admitting at 8 A.M.  Call this number if you have problems the morning of surgery:  [phone]   Remember:  Do not eat or drink after midnight.  You may drink clear liquids until 7AM .  Clear liquids allowed are:                    Water, Juice (non-citric and without pulp - diabetics please choose diet or no sugar options), Carbonated beverages - (diabetics please choose diet or no sugar options), Clear Tea, Black Coffee only (no creamer, milk or cream including half and half) and Plain Jell-O only (diabetics please choose diet or no sugar options)    Take these medicines the morning of surgery with A SIP OF WATER ---[place]  Do not take any aspirin,anti-inflammatories,vitamins,or herbal supplements 5-7 days prior to surgery.   Do not wear jewelry, make-up or nail polish.  Do not wear lotions, powders, or perfumes, or deodorant.  Do not shave 48 hours prior to surgery.  Men may shave face and neck.  Do not bring valuables to the hospital.  [facility] is not responsible for any belongings or valuables.  Contacts, dentures or bridgework may not be worn into surgery.  Leave your suitcase in the car.  After surgery it may be brought to your room.  For patients admitted to the hospital, discharge time will be determined by your treatment team.  Patients discharged the day of surgery will not be allowed to drive home.    Special instructions:  [place] - Preparing for Surgery  Before surgery, you can play an important role.  Because skin is not sterile, your skin needs to be as free of germs as possible.  You can reduce the number of germs on you skin by washing with CHG (chlorahexidine gluconate) soap before  surgery.  CHG is an antiseptic cleaner which kills germs and bonds with the skin to continue killing germs even after washing.  Oral Hygiene is also important in reducing the risk of infection.  Remember to brush your teeth with your regular toothpaste the morning of surgery.  Please DO NOT use if you have an allergy to CHG or antibacterial soaps.  If your skin becomes reddened/irritated stop using the CHG and inform your nurse when you arrive at Short Stay.  Do not shave (including legs and underarms) for at least 48 hours prior to the first CHG shower.  You may shave your face.  Please follow these instructions carefully:   1.  Shower with CHG Soap the night before surgery and the morning of Surgery.  2.  If you choose to wash your hair, wash your hair first as usual with your normal shampoo.  3.  After you shampoo, rinse your hair and body thoroughly to remove the shampoo. 4.  Use CHG as you would any other liquid soap.  You can apply chg directly to the skin and wash gently with a      scrungie or washcloth.           5.  Apply the CHG Soap to your body ONLY FROM THE NECK DOWN.   Do not use on open wounds or open sores.  Avoid contact with your eyes, ears, mouth and genitals (private parts).  Wash genitals (private parts) with your normal soap.  6.  Wash thoroughly, paying special attention to the area where your surgery will be performed.  7.  Thoroughly rinse your body with warm water from the neck down.  8.  DO NOT shower/wash with your normal soap after using and rinsing off the CHG Soap.  9.  Pat yourself dry with a clean towel.            10.  Wear clean pajamas.            11.  Place clean sheets on your bed the night of your first shower and do not sleep with pets.  Day of Surgery  Do not apply any lotions/deoderants the morning of surgery.   Please wear clean clothes to the hospital/surgery center. Remember to brush your teeth with toothpaste.    Please read over the  following fact sheets that you were given.

## 2020-06-14 ENCOUNTER — Other Ambulatory Visit: Payer: Self-pay

## 2020-06-14 ENCOUNTER — Encounter (HOSPITAL_COMMUNITY)
Admission: RE | Disposition: A | Discharge status: Home / Self Care | Payer: Self-pay | Source: Home / Self Care | Attending: General Surgery

## 2020-06-14 ENCOUNTER — Encounter (HOSPITAL_COMMUNITY): Payer: Self-pay | Admitting: General Surgery

## 2020-06-14 ENCOUNTER — Ambulatory Visit (HOSPITAL_COMMUNITY)
Admission: RE | Admit: 2020-06-14 | Discharge: 2020-06-14 | Disposition: A | Discharge status: Home / Self Care | Payer: 59 | Attending: General Surgery | Admitting: General Surgery

## 2020-06-14 ENCOUNTER — Ambulatory Visit (HOSPITAL_COMMUNITY): Payer: 59 | Admitting: Physician Assistant

## 2020-06-14 ENCOUNTER — Ambulatory Visit (HOSPITAL_COMMUNITY): Payer: 59

## 2020-06-14 DIAGNOSIS — K8010 Calculus of gallbladder with chronic cholecystitis without obstruction: Secondary | ICD-10-CM | POA: Diagnosis not present

## 2020-06-14 DIAGNOSIS — Z419 Encounter for procedure for purposes other than remedying health state, unspecified: Secondary | ICD-10-CM

## 2020-06-14 DIAGNOSIS — K811 Chronic cholecystitis: Secondary | ICD-10-CM | POA: Diagnosis not present

## 2020-06-14 DIAGNOSIS — K219 Gastro-esophageal reflux disease without esophagitis: Secondary | ICD-10-CM | POA: Insufficient documentation

## 2020-06-14 DIAGNOSIS — Z79899 Other long term (current) drug therapy: Secondary | ICD-10-CM | POA: Insufficient documentation

## 2020-06-14 DIAGNOSIS — E559 Vitamin D deficiency, unspecified: Secondary | ICD-10-CM | POA: Diagnosis not present

## 2020-06-14 DIAGNOSIS — J309 Allergic rhinitis, unspecified: Secondary | ICD-10-CM | POA: Diagnosis not present

## 2020-06-14 DIAGNOSIS — K8080 Other cholelithiasis without obstruction: Secondary | ICD-10-CM | POA: Diagnosis present

## 2020-06-14 DIAGNOSIS — E785 Hyperlipidemia, unspecified: Secondary | ICD-10-CM | POA: Diagnosis not present

## 2020-06-14 HISTORY — PX: CHOLECYSTECTOMY: SHX55

## 2020-06-14 LAB — POCT PREGNANCY, URINE: Preg Test, Ur: NEGATIVE

## 2020-06-14 SURGERY — LAPAROSCOPIC CHOLECYSTECTOMY WITH INTRAOPERATIVE CHOLANGIOGRAM
Anesthesia: General | Site: Abdomen

## 2020-06-14 MED ORDER — MIDAZOLAM HCL 2 MG/2ML IJ SOLN
INTRAMUSCULAR | Status: AC
  Filled 2020-06-14: qty 2

## 2020-06-14 MED ORDER — ONDANSETRON HCL 4 MG/2ML IJ SOLN
INTRAMUSCULAR | Status: AC
  Filled 2020-06-14: qty 2

## 2020-06-14 MED ORDER — CEFAZOLIN SODIUM-DEXTROSE 2-4 GM/100ML-% IV SOLN
2.0000 g | INTRAVENOUS | Status: AC
  Administered 2020-06-14: 2 g via INTRAVENOUS
  Filled 2020-06-14: qty 100

## 2020-06-14 MED ORDER — 0.9 % SODIUM CHLORIDE (POUR BTL) OPTIME
TOPICAL | Status: DC | PRN
  Administered 2020-06-14: 1000 mL

## 2020-06-14 MED ORDER — AMISULPRIDE (ANTIEMETIC) 5 MG/2ML IV SOLN
10.0000 mg | Freq: Once | INTRAVENOUS | Status: AC
  Administered 2020-06-14: 10 mg via INTRAVENOUS

## 2020-06-14 MED ORDER — FENTANYL CITRATE (PF) 250 MCG/5ML IJ SOLN
INTRAMUSCULAR | Status: DC | PRN
  Administered 2020-06-14: 100 ug via INTRAVENOUS

## 2020-06-14 MED ORDER — GABAPENTIN 300 MG PO CAPS
300.0000 mg | ORAL_CAPSULE | ORAL | Status: AC
  Administered 2020-06-14: 300 mg via ORAL
  Filled 2020-06-14: qty 1

## 2020-06-14 MED ORDER — SCOPOLAMINE 1 MG/3DAYS TD PT72
MEDICATED_PATCH | TRANSDERMAL | Status: AC
  Filled 2020-06-14: qty 1

## 2020-06-14 MED ORDER — PROPOFOL 10 MG/ML IV BOLUS
INTRAVENOUS | Status: DC | PRN
  Administered 2020-06-14: 120 mg via INTRAVENOUS

## 2020-06-14 MED ORDER — HYDROMORPHONE HCL 1 MG/ML IJ SOLN: 0.2500 mg | INTRAMUSCULAR | Status: DC | PRN

## 2020-06-14 MED ORDER — BUPIVACAINE HCL (PF) 0.25 % IJ SOLN
INTRAMUSCULAR | Status: DC | PRN
  Administered 2020-06-14: 17 mL

## 2020-06-14 MED ORDER — PHENYLEPHRINE 40 MCG/ML (10ML) SYRINGE FOR IV PUSH (FOR BLOOD PRESSURE SUPPORT)
PREFILLED_SYRINGE | INTRAVENOUS | Status: AC
  Filled 2020-06-14: qty 10

## 2020-06-14 MED ORDER — BUPIVACAINE HCL (PF) 0.25 % IJ SOLN
INTRAMUSCULAR | Status: AC
  Filled 2020-06-14: qty 30

## 2020-06-14 MED ORDER — ACETAMINOPHEN 500 MG PO TABS
1000.0000 mg | ORAL_TABLET | ORAL | Status: AC
  Administered 2020-06-14: 1000 mg via ORAL
  Filled 2020-06-14: qty 2

## 2020-06-14 MED ORDER — DEXAMETHASONE SODIUM PHOSPHATE 10 MG/ML IJ SOLN
INTRAMUSCULAR | Status: AC
  Filled 2020-06-14: qty 1

## 2020-06-14 MED ORDER — LIDOCAINE 2% (20 MG/ML) 5 ML SYRINGE
INTRAMUSCULAR | Status: AC
  Filled 2020-06-14: qty 10

## 2020-06-14 MED ORDER — EPHEDRINE 5 MG/ML INJ
INTRAVENOUS | Status: AC
  Filled 2020-06-14: qty 10

## 2020-06-14 MED ORDER — SODIUM CHLORIDE 0.9 % IV SOLN: INTRAVENOUS | Status: DC | PRN

## 2020-06-14 MED ORDER — LACTATED RINGERS IV SOLN: INTRAVENOUS | Status: DC

## 2020-06-14 MED ORDER — SUCCINYLCHOLINE CHLORIDE 200 MG/10ML IV SOSY
PREFILLED_SYRINGE | INTRAVENOUS | Status: AC
  Filled 2020-06-14: qty 10

## 2020-06-14 MED ORDER — ROCURONIUM BROMIDE 10 MG/ML (PF) SYRINGE
PREFILLED_SYRINGE | INTRAVENOUS | Status: DC | PRN
  Administered 2020-06-14: 10 mg via INTRAVENOUS
  Administered 2020-06-14: 50 mg via INTRAVENOUS

## 2020-06-14 MED ORDER — LIDOCAINE 2% (20 MG/ML) 5 ML SYRINGE
INTRAMUSCULAR | Status: DC | PRN
  Administered 2020-06-14: 60 mg via INTRAVENOUS

## 2020-06-14 MED ORDER — DEXAMETHASONE SODIUM PHOSPHATE 10 MG/ML IJ SOLN
INTRAMUSCULAR | Status: DC | PRN
  Administered 2020-06-14: 10 mg via INTRAVENOUS

## 2020-06-14 MED ORDER — CHLORHEXIDINE GLUCONATE 0.12 % MT SOLN
15.0000 mL | Freq: Once | OROMUCOSAL | Status: AC
  Administered 2020-06-14: 15 mL via OROMUCOSAL
  Filled 2020-06-14: qty 15

## 2020-06-14 MED ORDER — PROPOFOL 10 MG/ML IV BOLUS
INTRAVENOUS | Status: AC
  Filled 2020-06-14: qty 20

## 2020-06-14 MED ORDER — HEMOSTATIC AGENTS (NO CHARGE) OPTIME
TOPICAL | Status: DC | PRN
  Administered 2020-06-14: 1 via TOPICAL

## 2020-06-14 MED ORDER — ONDANSETRON HCL 4 MG/2ML IJ SOLN
INTRAMUSCULAR | Status: DC | PRN
  Administered 2020-06-14: 4 mg via INTRAVENOUS

## 2020-06-14 MED ORDER — AMISULPRIDE (ANTIEMETIC) 5 MG/2ML IV SOLN
INTRAVENOUS | Status: AC
  Filled 2020-06-14: qty 4

## 2020-06-14 MED ORDER — CELECOXIB 200 MG PO CAPS
200.0000 mg | ORAL_CAPSULE | ORAL | Status: AC
  Administered 2020-06-14: 200 mg via ORAL
  Filled 2020-06-14: qty 1

## 2020-06-14 MED ORDER — ROCURONIUM BROMIDE 10 MG/ML (PF) SYRINGE
PREFILLED_SYRINGE | INTRAVENOUS | Status: AC
  Filled 2020-06-14: qty 10

## 2020-06-14 MED ORDER — HYDROCODONE-ACETAMINOPHEN 5-325 MG PO TABS: 1.0000 | ORAL_TABLET | Freq: Four times a day (QID) | ORAL | 0 refills | Status: DC | PRN

## 2020-06-14 MED ORDER — SCOPOLAMINE 1 MG/3DAYS TD PT72
MEDICATED_PATCH | TRANSDERMAL | Status: DC | PRN
  Administered 2020-06-14: 1 via TRANSDERMAL

## 2020-06-14 MED ORDER — ORAL CARE MOUTH RINSE: 15.0000 mL | Freq: Once | OROMUCOSAL | Status: AC

## 2020-06-14 MED ORDER — FENTANYL CITRATE (PF) 250 MCG/5ML IJ SOLN
INTRAMUSCULAR | Status: AC
  Filled 2020-06-14: qty 5

## 2020-06-14 MED ORDER — MIDAZOLAM HCL 2 MG/2ML IJ SOLN
INTRAMUSCULAR | Status: DC | PRN
  Administered 2020-06-14: 2 mg via INTRAVENOUS

## 2020-06-14 MED ORDER — SUGAMMADEX SODIUM 200 MG/2ML IV SOLN
INTRAVENOUS | Status: DC | PRN
  Administered 2020-06-14: 150 mg via INTRAVENOUS

## 2020-06-14 MED FILL — HYDROCODON-APAP 5-325: 5-325 | 2 days supply | Qty: 15 | Fill #0

## 2020-06-14 SURGICAL SUPPLY — 42 items
ADH SKN CLS APL DERMABOND .7 (GAUZE/BANDAGES/DRESSINGS) ×1
APL PRP STRL LF DISP 70% ISPRP (MISCELLANEOUS) ×1
APPLIER CLIP 5 13 M/L LIGAMAX5 (MISCELLANEOUS) ×3
APR CLP MED LRG 5 ANG JAW (MISCELLANEOUS) ×1
BAG SPEC RTRVL LRG 6X4 10 (ENDOMECHANICALS) ×1
BLADE CLIPPER SURG (BLADE) IMPLANT
CANISTER SUCT 3000ML PPV (MISCELLANEOUS) ×3 IMPLANT
CATH REDDICK CHOLANGI 4FR 50CM (CATHETERS) ×1 IMPLANT
CHLORAPREP W/TINT 26 (MISCELLANEOUS) ×3 IMPLANT
CLIP APPLIE 5 13 M/L LIGAMAX5 (MISCELLANEOUS) ×1 IMPLANT
COVER MAYO STAND STRL (DRAPES) ×3 IMPLANT
COVER SURGICAL LIGHT HANDLE (MISCELLANEOUS) ×3 IMPLANT
COVER WAND RF STERILE (DRAPES) ×3 IMPLANT
DERMABOND ADVANCED (GAUZE/BANDAGES/DRESSINGS) ×2
DERMABOND ADVANCED .7 DNX12 (GAUZE/BANDAGES/DRESSINGS) ×1 IMPLANT
DRAPE C-ARM 42X120 X-RAY (DRAPES) ×1 IMPLANT
ELECT REM PT RETURN 9FT ADLT (ELECTROSURGICAL) ×3
ELECTRODE REM PT RTRN 9FT ADLT (ELECTROSURGICAL) ×1 IMPLANT
ENDOLOOP SUT PDS II  0 18 (SUTURE) ×3
ENDOLOOP SUT PDS II 0 18 (SUTURE) IMPLANT
GLOVE BIO SURGEON STRL SZ7.5 (GLOVE) ×3 IMPLANT
GOWN STRL REUS W/ TWL LRG LVL3 (GOWN DISPOSABLE) ×3 IMPLANT
GOWN STRL REUS W/TWL LRG LVL3 (GOWN DISPOSABLE) ×9
HEMOSTAT SNOW SURGICEL 2X4 (HEMOSTASIS) ×2 IMPLANT
IV CATH 14GX2 1/4 (CATHETERS) ×3 IMPLANT
KIT BASIN OR (CUSTOM PROCEDURE TRAY) ×3 IMPLANT
KIT TURNOVER KIT B (KITS) ×3 IMPLANT
NS IRRIG 1000ML POUR BTL (IV SOLUTION) ×3 IMPLANT
PAD ARMBOARD 7.5X6 YLW CONV (MISCELLANEOUS) ×3 IMPLANT
POUCH SPECIMEN RETRIEVAL 10MM (ENDOMECHANICALS) ×3 IMPLANT
SCISSORS LAP 5X35 DISP (ENDOMECHANICALS) ×3 IMPLANT
SET IRRIG TUBING LAPAROSCOPIC (IRRIGATION / IRRIGATOR) ×3 IMPLANT
SET TUBE SMOKE EVAC HIGH FLOW (TUBING) ×3 IMPLANT
SLEEVE ENDOPATH XCEL 5M (ENDOMECHANICALS) ×6 IMPLANT
SPECIMEN JAR SMALL (MISCELLANEOUS) ×3 IMPLANT
SUT MNCRL AB 4-0 PS2 18 (SUTURE) ×3 IMPLANT
TOWEL GREEN STERILE (TOWEL DISPOSABLE) ×3 IMPLANT
TOWEL GREEN STERILE FF (TOWEL DISPOSABLE) ×3 IMPLANT
TRAY LAPAROSCOPIC MC (CUSTOM PROCEDURE TRAY) ×3 IMPLANT
TROCAR XCEL BLUNT TIP 100MML (ENDOMECHANICALS) ×3 IMPLANT
TROCAR XCEL NON-BLD 5MMX100MML (ENDOMECHANICALS) ×3 IMPLANT
WATER STERILE IRR 1000ML POUR (IV SOLUTION) ×3 IMPLANT

## 2020-06-14 NOTE — Transfer of Care (Signed)
Immediate Anesthesia Transfer of Care Note  Patient: Megan [name]  Procedure(s) Performed: LAPAROSCOPIC CHOLECYSTECTOMY WITH ATTEMPTED INTRAOPERATIVE CHOLANGIOGRAM (N/A Abdomen)  Patient Location: PACU  Anesthesia Type:General  Level of Consciousness: awake and drowsy  Airway & Oxygen Therapy: Patient Spontanous Breathing  Post-op Assessment: Report given to RN and Post -op Vital signs reviewed and stable  Post vital signs: Reviewed and stable  Last Vitals:  Vitals Value Taken Time  BP    Temp    Pulse    Resp    SpO2      Last Pain:  Vitals:   06/14/20 0803  TempSrc: Oral         Complications: No complications documented.

## 2020-06-14 NOTE — H&P (Signed)
  Megan [name]  Location: [facility] Patient #: [id number] DOB: January 11, 1971 Married / Language: English / Race: White Female   History of Present Illness  The patient is a 49 year [name] female who presents for a follow-up for Breast cancer. The patient is a 31 year [name] white female who is about 6 weeks status post bilateral nipple sparing mastectomies for right sided ductal carcinoma in situ. Her postoperative course was complicated by some ischemia of the skin which is healing and a small area of superficial separation of the inframammary fold incision. All of this seems to be improving fairly significantly. Since her last visit she is also began having episodes of epigastric pain associated with nausea and vomiting. She went to her medical doctor and her liver functions were significantly elevated. She is scheduled for a CT scan tomorrow to look for gallstones. She has been noting some jaundice of the sclera and itching.   Allergies  No Known Allergies    Medication History  NexIUM (40MG  Capsule DR, Oral) Active. ZyrTEC Allergy (10MG  Tablet, Oral) Active. Medications Reconciled  Vitals  Weight: 161 lb Height: 65in Body Surface Area: 1.8 m Body Mass Index: 26.79 kg/m  Temp.: 98.67F  Pulse: 94 (Regular)  BP: 126/78(Sitting, Left Arm, Standard)       Physical Exam  Breast Note: The skin, nipples, and areola are much healthier appearing. There is slight separation of the inframammary fold incisions but they are very clean and shallow. There is no evidence of infection or seroma. Overall she is much improved.     Assessment & Plan  DUCTAL CARCINOMA IN SITU (DCIS) OF RIGHT BREAST (D05.11) Impression: The patient is about 6 weeks status post bilateral nipple sparing mastectomies for a right-sided ductal carcinoma in situ. She tolerated surgery well. She has had some superficial skin breakdown at the inframammary fold incisions but these seem to be  clean and healing. The partial necrosis of the nipple is also healing very well. At this point she will continue to see plastic surgery for wound management. I will plan to see her back in about 3 months. Of note she is also having intermittent epigastric pain with nausea and vomiting and elevated liver functions. She is scheduled for a CT scan tomorrow to look for gallstones. Her symptoms are certainly consistent with this. If her CT scan does show gallstones then I think she would be a good candidate to have her gallbladder removed laparoscopically. I have discussed with her in detail the risks and benefits of the operation as well as some of the technical aspects and she understands. We will call her with the results of the scan once it's available Current Plans Follow up with [place] in the office in 3 MONTHS.  Call us sooner as needed.  METABOLIC PANEL, COMPREHENSIVE (80053) LIPASE (63893) ABDOMINAL PAIN, EPIGASTRIC (R10.13)

## 2020-06-14 NOTE — Interval H&P Note (Signed)
History and Physical Interval Note:  06/14/2020 9:33 AM  Megan [name]  has presented today for surgery, with the diagnosis of CHOLECYSTITIS WITH CHOLELITHIASIS.  The various methods of treatment have been discussed with the patient and family. After consideration of risks, benefits and other options for treatment, the patient has consented to  Procedure(s): LAPAROSCOPIC CHOLECYSTECTOMY WITH INTRAOPERATIVE CHOLANGIOGRAM (N/A) as a surgical intervention.  The patient's history has been reviewed, patient examined, no change in status, stable for surgery.  I have reviewed the patient's chart and labs.  Questions were answered to the patient's satisfaction.     Autumn Messing III

## 2020-06-14 NOTE — Anesthesia Preprocedure Evaluation (Signed)
Anesthesia Evaluation  Patient identified by MRN, date of birth, ID band Patient awake    Reviewed: Allergy & Precautions, H&P , NPO status , Patient's Chart, lab work & pertinent test results  Airway Mallampati: I  TM Distance: >3 FB Neck ROM: Full    Dental no notable dental hx. (+) Teeth Intact, Dental Advisory Given   Pulmonary neg pulmonary ROS,    Pulmonary exam normal breath sounds clear to auscultation       Cardiovascular negative cardio ROS   Rhythm:Regular Rate:Normal     Neuro/Psych negative neurological ROS  negative psych ROS   GI/Hepatic Neg liver ROS, GERD  Medicated and Controlled,  Endo/Other  negative endocrine ROS  Renal/GU negative Renal ROS  negative genitourinary   Musculoskeletal   Abdominal   Peds  Hematology negative hematology ROS (+)   Anesthesia Other Findings   Reproductive/Obstetrics negative OB ROS                             Anesthesia Physical Anesthesia Plan  ASA: II  Anesthesia Plan: General   Post-op Pain Management:    Induction: Intravenous  PONV Risk Score and Plan: 4 or greater and Ondansetron, Dexamethasone and Midazolam  Airway Management Planned: Oral ETT  Additional Equipment:   Intra-op Plan:   Post-operative Plan: Extubation in OR  Informed Consent: I have reviewed the patients History and Physical, chart, labs and discussed the procedure including the risks, benefits and alternatives for the proposed anesthesia with the patient or authorized representative who has indicated his/her understanding and acceptance.     Dental advisory given  Plan Discussed with: CRNA  Anesthesia Plan Comments:         Anesthesia Quick Evaluation

## 2020-06-14 NOTE — Op Note (Signed)
06/14/2020  11:20 AM  PATIENT:  Megan [name]  49 y.o. female  PRE-OPERATIVE DIAGNOSIS:  CHOLECYSTITIS WITH CHOLELITHIASIS  POST-OPERATIVE DIAGNOSIS:  CHOLECYSTITIS WITH CHOLELITHIASIS  PROCEDURE:  Procedure(s): LAPAROSCOPIC CHOLECYSTECTOMY WITH ATTEMPTED INTRAOPERATIVE CHOLANGIOGRAM (N/A)  SURGEON:  Surgeon(s) and Role:    * Jovita Kussmaul, MD - Primary  PHYSICIAN ASSISTANT:   ASSISTANTS: Pryor Curia, RNFA   ANESTHESIA:   local and general  EBL:  minimal   BLOOD ADMINISTERED:none  DRAINS: none   LOCAL MEDICATIONS USED:  MARCAINE     SPECIMEN:  Source of Specimen:  gallbladder  DISPOSITION OF SPECIMEN:  PATHOLOGY  COUNTS:  YES  TOURNIQUET:  * No tourniquets in log *  DICTATION: .Dragon Dictation     Procedure: After informed consent was obtained the patient was brought to the operating room and placed in the supine position on the operating room table. After adequate induction of general anesthesia the patient's abdomen was prepped with ChloraPrep allowed to dry and draped in usual sterile manner. An appropriate timeout was performed. The area below the umbilicus was infiltrated with quarter percent  Marcaine. A small incision was made with a 15 blade knife. The incision was carried down through the subcutaneous tissue bluntly with a hemostat and [employer] retractors. The linea alba was identified. The linea alba was incised with a 15 blade knife and each side was grasped with Coker clamps. The preperitoneal space was then probed with a hemostat until the peritoneum was opened and access was gained to the abdominal cavity. A 0 Vicryl pursestring stitch was placed in the fascia surrounding the opening. A Hassan cannula was then placed through the opening and anchored in place with the previously placed Vicryl purse string stitch. The abdomen was insufflated with carbon dioxide without difficulty. A laparoscope was inserted through the [facility] cannula in the right upper  quadrant was inspected. Next the epigastric region was infiltrated with % Marcaine. A small incision was made with a 15 blade knife. A 5 mm port was placed bluntly through this incision into the abdominal cavity under direct vision. Next 2 sites were chosen laterally on the right side of the abdomen for placement of 5 mm ports. Each of these areas was infiltrated with quarter percent Marcaine. Small stab incisions were made with a 15 blade knife. 5 mm ports were then placed bluntly through these incisions into the abdominal cavity under direct vision without difficulty. A blunt grasper was placed through the lateralmost 5 mm port and used to grasp the dome of the gallbladder and elevated anteriorly and superiorly. Another blunt grasper was placed through the other 5 mm port and used to retract the body and neck of the gallbladder. A dissector was placed through the epigastric port and using the electrocautery the peritoneal reflection at the gallbladder neck was opened. Blunt dissection was then carried out in this area until the gallbladder neck-cystic duct junction was readily identified and a good window was created.  The bottom of the gallbladder was rather bulbous and it appeared as though the cystic duct was quite short.  A single clip was placed on the gallbladder neck. A small  ductotomy was made just below the clip with laparoscopic scissors. A 14-gauge Angiocath was then placed through the anterior abdominal wall under direct vision. A Reddick cholangiogram catheter was then placed through the Angiocath and flushed.  The catheter would not feed into the cystic duct very well and I could not get a good enough seal to get  a cholangiogram.  Dissected out some of the body of the gallbladder by combination of blunt dissection and some sharp dissection with the cautery to make sure that what we were identifying was only cystic duct and at that point felt good about the anatomy.  The common duct also was very  dilated.  Because of the dilation of the cystic duct and the shortness I was only able to control the cystic duct with a PDS Endoloop after dividing the cystic duct at the bottom of the gallbladder sharply with scissors.  Posterior to this the cystic artery was identified and again dissected bluntly in a circumferential manner until a good window  was created. 2 clips were placed proximally and one distally on the artery and the artery was divided between the 2 sets of clips. Next a laparoscopic hook cautery device was used to separate the gallbladder from the liver bed.  The gallbladder was also rather intraparenchymal. prior to completely detaching the gallbladder from the liver bed the liver bed was inspected and several small bleeding points were coagulated with the electrocautery until the area was completely hemostatic.  A small piece of Surgicel snow was placed on the gallbladder bed of the liver.  The gallbladder was then detached the rest of it from the liver bed without difficulty. A laparoscopic bag was inserted through the hassan port. The laparoscope was moved to the epigastric port. The gallbladder was placed within the bag and the bag was sealed.  The bag with the gallbladder was then removed with the [facility] cannula through the infraumbilical port without difficulty. The fascial defect was then closed with the previously placed Vicryl pursestring stitch as well as with another figure-of-eight 0 Vicryl stitch. The liver bed was inspected again and found to be hemostatic. The abdomen was irrigated with copious amounts of saline until the effluent was clear. The ports were then removed under direct vision without difficulty and were found to be hemostatic. The gas was allowed to escape. The skin incisions were all closed with interrupted 4-0 Monocryl subcuticular stitches. Dermabond dressings were applied. The patient tolerated the procedure well. At the end of the case all needle sponge and instrument  counts were correct. The patient was then awakened and taken to recovery in stable condition  PLAN OF CARE: Discharge to home after PACU  PATIENT DISPOSITION:  PACU - hemodynamically stable.   Delay start of Pharmacological VTE agent (>24hrs) due to surgical blood loss or risk of bleeding: not applicable

## 2020-06-14 NOTE — Anesthesia Postprocedure Evaluation (Signed)
Anesthesia Post Note  Patient: [name]  Procedure(s) Performed: LAPAROSCOPIC CHOLECYSTECTOMY WITH ATTEMPTED INTRAOPERATIVE CHOLANGIOGRAM (N/A Abdomen)     Patient location during evaluation: PACU Anesthesia Type: General Level of consciousness: awake and alert Pain management: pain level controlled Vital Signs Assessment: post-procedure vital signs reviewed and stable Respiratory status: spontaneous breathing, nonlabored ventilation and respiratory function stable Cardiovascular status: blood pressure returned to baseline and stable Postop Assessment: no apparent nausea or vomiting Anesthetic complications: no   No complications documented.  Last Vitals:  Vitals:   06/14/20 1211 06/14/20 1226  BP: (!) 160/77 (!) 160/77  Pulse: 67 70  Resp: (!) 24 16  Temp:  (!) 36.3 C  SpO2: 97% 98%    Last Pain:  Vitals:   06/14/20 1226  TempSrc:   PainSc: Asleep                 Lynda Rainwater

## 2020-06-14 NOTE — Anesthesia Procedure Notes (Signed)
Procedure Name: Intubation Date/Time: 06/14/2020 10:05 AM Performed by: Roderic Palau, MD Pre-anesthesia Checklist: Patient identified, Emergency Drugs available, Suction available and Patient being monitored Patient Re-evaluated:Patient Re-evaluated prior to induction Oxygen Delivery Method: Circle system utilized Preoxygenation: Pre-oxygenation with 100% oxygen Induction Type: IV induction Ventilation: Mask ventilation without difficulty Laryngoscope Size: Mac and 3 Grade View: Grade I Tube type: Oral Tube size: 7.0 mm Number of attempts: 1 Airway Equipment and Method: Stylet and Oral airway Placement Confirmation: ETT inserted through vocal cords under direct vision,  positive ETCO2 and breath sounds checked- equal and bilateral Secured at: 21 cm Tube secured with: Tape Dental Injury: Teeth and Oropharynx as per pre-operative assessment

## 2020-06-15 ENCOUNTER — Encounter (HOSPITAL_COMMUNITY): Payer: Self-pay | Admitting: General Surgery

## 2020-06-18 LAB — SURGICAL PATHOLOGY

## 2020-06-25 ENCOUNTER — Ambulatory Visit (INDEPENDENT_AMBULATORY_CARE_PROVIDER_SITE_OTHER): Payer: 59 | Admitting: Plastic Surgery

## 2020-06-25 ENCOUNTER — Encounter: Payer: Self-pay | Admitting: Plastic Surgery

## 2020-06-25 ENCOUNTER — Other Ambulatory Visit: Payer: Self-pay

## 2020-06-25 VITALS — BP 122/77 | HR 86 | Temp 97.3°F | Ht 65.0 in | Wt 165.0 lb

## 2020-06-25 DIAGNOSIS — C50411 Malignant neoplasm of upper-outer quadrant of right female breast: Secondary | ICD-10-CM

## 2020-06-25 DIAGNOSIS — Z9889 Other specified postprocedural states: Secondary | ICD-10-CM

## 2020-06-25 DIAGNOSIS — Z170 Estrogen receptor positive status [ER+]: Secondary | ICD-10-CM

## 2020-06-25 MED ORDER — CEPHALEXIN 500 MG PO CAPS
500.0000 mg | ORAL_CAPSULE | Freq: Four times a day (QID) | ORAL | 0 refills | Status: AC
Start: 2020-06-25 — End: 2020-06-30

## 2020-06-25 MED FILL — CEPHALEXIN 500 MG CAPSULE: 500 | 5 days supply | Qty: 20 | Fill #0

## 2020-06-25 NOTE — Addendum Note (Signed)
Addended byRoetta Sessions on: 06/25/2020 09:32 AM   Modules accepted: Orders

## 2020-06-25 NOTE — H&P (View-Only) (Signed)
   Subjective:    Patient ID: Megan [name], female    DOB: 08-26-1971, 49 y.o.   MRN: [id number]  The patient is a 49 year old female here for follow-up on her bilateral breast reconstruction.  She underwent bilateral mastectomies with expander and Flex HD placement.  During the expanding process she had a little bit of opening at the inframammary fold.  We have had to expand her slowly as a result.  She also was not getting good nutrition because she was having pain with eating.  She was diagnosed with gallstones.  She had her gallbladder out and is doing much better.  She noticed that her healing improved greatly.  She is now back to regular healthy diet.  She had been using Vaseline and gauze to the area.  The right side is completely healed and the left side is about 2 to 3 mm and superficial.  She is overall happy with her size and ready for exchange.  She understands the limitations due to the wounds and ptotic nipple areolar complex.     Review of Systems  Constitutional: Negative.   HENT: Negative.   Eyes: Negative.   Respiratory: Negative.   Cardiovascular: Negative.   Endocrine: Negative.   Genitourinary: Negative.   Skin: Positive for color change and wound.       Objective:   Physical Exam Vitals and nursing note reviewed.  Constitutional:      Appearance: Normal appearance.  HENT:     Head: Normocephalic and atraumatic.  Cardiovascular:     Rate and Rhythm: Normal rate.     Pulses: Normal pulses.  Pulmonary:     Effort: Pulmonary effort is normal.  Neurological:     General: No focal deficit present.     Mental Status: She is alert. Mental status is at baseline.  Psychiatric:        Mood and Affect: Mood normal.        Behavior: Behavior normal.           Assessment & Plan:     ICD-10-CM   1. S/P breast reconstruction, bilateral  Z98.890   2. Malignant neoplasm of upper-outer quadrant of right breast in female, estrogen receptor positive ([place])  C50.411     Z17.0     I went ahead and sent the order in for the implants.  We will plan on removal of expanders and placement of implants.  She will likely need fat grafting in the next few months to improve the appearance.  We placed injectable saline in the Expander using a sterile technique: Right: 50 cc for a total of 390 / 535 cc Left: 50 cc for a total of 390 / 535 cc  The risks that can be encountered with and after placement of a breast implant were discussed and include the following but not limited to these: bleeding, infection, delayed healing and possible need for revisional surgery or staged procedures.

## 2020-06-25 NOTE — Progress Notes (Signed)
   Subjective:    Patient ID: Megan [name], female    DOB: [birth year], 49 y.o.   MRN: [id number]  The patient is a 49 year old female here for follow-up on her bilateral breast reconstruction.  She underwent bilateral mastectomies with expander and Flex HD placement.  During the expanding process she had a little bit of opening at the inframammary fold.  We have had to expand her slowly as a result.  She also was not getting good nutrition because she was having pain with eating.  She was diagnosed with gallstones.  She had her gallbladder out and is doing much better.  She noticed that her healing improved greatly.  She is now back to regular healthy diet.  She had been using Vaseline and gauze to the area.  The right side is completely healed and the left side is about 2 to 3 mm and superficial.  She is overall happy with her size and ready for exchange.  She understands the limitations due to the wounds and ptotic nipple areolar complex.     Review of Systems  Constitutional: Negative.   HENT: Negative.   Eyes: Negative.   Respiratory: Negative.   Cardiovascular: Negative.   Endocrine: Negative.   Genitourinary: Negative.   Skin: Positive for color change and wound.       Objective:   Physical Exam Vitals and nursing note reviewed.  Constitutional:      Appearance: Normal appearance.  HENT:     Head: Normocephalic and atraumatic.  Cardiovascular:     Rate and Rhythm: Normal rate.     Pulses: Normal pulses.  Pulmonary:     Effort: Pulmonary effort is normal.  Neurological:     General: No focal deficit present.     Mental Status: She is alert. Mental status is at baseline.  Psychiatric:        Mood and Affect: Mood normal.        Behavior: Behavior normal.           Assessment & Plan:     ICD-10-CM   1. S/P breast reconstruction, bilateral  Z98.890   2. Malignant neoplasm of upper-outer quadrant of right breast in female, estrogen receptor positive ([place])  C50.411     Z17.0     I went ahead and sent the order in for the implants.  We will plan on removal of expanders and placement of implants.  She will likely need fat grafting in the next few months to improve the appearance.  We placed injectable saline in the Expander using a sterile technique: Right: 50 cc for a total of 390 / 535 cc Left: 50 cc for a total of 390 / 535 cc  The risks that can be encountered with and after placement of a breast implant were discussed and include the following but not limited to these: bleeding, infection, delayed healing and possible need for revisional surgery or staged procedures.

## 2020-06-25 NOTE — Progress Notes (Signed)
Patient ID: Megan [name], female    DOB: [birth year], 49 y.o.   MRN: [id number]  Chief Complaint  Patient presents with  . Pre-op Exam      ICD-10-CM   1. S/P breast reconstruction, bilateral  Z98.890   2. Malignant neoplasm of upper-outer quadrant of right breast in female, estrogen receptor positive ([place])  C50.411    Z17.0      History of Present Illness: Megan [name] is a 49 y.o.  female  with a history of right breast DCIS that was found after screening mammogram.  Patient underwent bilateral breast reconstruction with placement of tissue expanders and Flex HD by Dr. Marla Roe on 04/08/2020 after nipple sparing mastectomies by Dr. Marlou Starks..  She presents for preoperative evaluation for upcoming procedure, removal of bilateral tissue expanders and placement of bilateral breast implants, scheduled for 07/10/2020 with Dr. Marla Roe.  She reports she is doing well after cholecystectomy.  The patient has not had problems with anesthesia.  She does report a little bit of postoperative nausea, but previously had scopolamine patch which greatly helped.  No history of DVT/PE.  No family history of DVT/PE.  No family or personal history of bleeding or clotting disorders.  Patient is not currently taking any blood thinners.  No cardiac or pulmonary disease.  Patient is currently happy with her current expander size, she saw Dr. Marla Roe today an additional 50 cc was injected into each breast expander.  She currently has a total of 390 cc at 535 cc in each expander.  Job: Marine scientist for [employer]   Past Medical History: Allergies: Allergies  Allergen Reactions  . Valium [Diazepam] Itching and Other (See Comments)    questionable    Current Medications:  Current Outpatient Medications:  .  cetirizine (ZYRTEC) 10 MG tablet, Take 10 mg by mouth daily., Disp: , Rfl:  .  cyclobenzaprine (FLEXERIL) 5 MG tablet, Take 5 mg by mouth 3 (three) times daily as needed for muscle spasms. , Disp: , Rfl:    .  esomeprazole (NEXIUM) 40 MG capsule, Take 1 capsule (40 mg total) by mouth daily. (Patient taking differently: Take 20 mg by mouth daily. ), Disp: 14 capsule, Rfl: 0 .  fluticasone (FLONASE) 50 MCG/ACT nasal spray, Place 2 sprays into both nostrils daily. (Patient taking differently: Place 1 spray into both nostrils daily. ), Disp: 16 g, Rfl: 6 .  HYDROcodone-acetaminophen (NORCO/VICODIN) 5-325 MG tablet, Take 1-2 tablets by mouth every 6 (six) hours as needed for moderate pain or severe pain., Disp: 15 tablet, Rfl: 0 .  levonorgestrel (MIRENA) 20 MCG/24HR IUD, 1 each by Intrauterine route once., Disp: , Rfl:  .  ondansetron (ZOFRAN) 4 MG tablet, Take 1 tablet (4 mg total) by mouth every 8 (eight) hours as needed for nausea or vomiting., Disp: 20 tablet, Rfl: 0  Past Medical Problems: Past Medical History:  Diagnosis Date  . Allergic rhinitis   . Allergy   . Breast cancer (HCC)    BIL BREAST  . Family history of breast cancer   . Family history of colon cancer   . Family history of prostate cancer   . GERD (gastroesophageal reflux disease)   . MRSA infection 2017   left buttock  . SVD (spontaneous vaginal delivery)    x 1  . TB lung, latent    +  quantiferon, neg PPD - s/p treatment - completed    neg cxr    Past Surgical History: Past Surgical History:  Procedure Laterality Date  . BILIARY DILATION  05/30/2020   Procedure: BILIARY DILATION;  Surgeon: [name], MD;  Location: [facility];  Service: Endoscopy;;  . BREAST BIOPSY Right 08/24/2012   benign  . BREAST RECONSTRUCTION WITH PLACEMENT OF TISSUE EXPANDER AND FLEX HD (ACELLULAR HYDRATED DERMIS) Bilateral 04/08/2020   Procedure: BILATERAL BREAST RECONSTRUCTION WITH PLACEMENT OF TISSUE EXPANDER AND FLEX HD (ACELLULAR HYDRATED DERMIS);  Surgeon: Wallace Going, DO;  Location: [facility];  Service: Plastics;  Laterality: Bilateral;  . CESAREAN SECTION     x 1  . CHOLECYSTECTOMY N/A 06/14/2020    Procedure: LAPAROSCOPIC CHOLECYSTECTOMY WITH ATTEMPTED INTRAOPERATIVE CHOLANGIOGRAM;  Surgeon: Jovita Kussmaul, MD;  Location: [facility];  Service: General;  Laterality: N/A;  . DILATION AND CURETTAGE OF UTERUS     x 2  . ENDOSCOPIC RETROGRADE CHOLANGIOPANCREATOGRAPHY (ERCP) WITH PROPOFOL N/A 05/30/2020   Procedure: ENDOSCOPIC RETROGRADE CHOLANGIOPANCREATOGRAPHY (ERCP) WITH PROPOFOL;  Surgeon: [name], MD;  Location: [facility];  Service: Endoscopy;  Laterality: N/A;  . NIPPLE SPARING MASTECTOMY WITH SENTINEL LYMPH NODE BIOPSY Bilateral 04/08/2020   Procedure: RIGHT NIPPLE SPARING MASTECTOMY WITH RIGHT SENTINEL LYMPH NODE BIOPSY, LEFT PROPHYLACTIC MASTECTOMY;  Surgeon: Jovita Kussmaul, MD;  Location: [facility];  Service: General;  Laterality: Bilateral;  . REMOVAL OF STONES  05/30/2020   Procedure: REMOVAL OF STONES;  Surgeon: [name], MD;  Location: [facility];  Service: Endoscopy;;  . Joan Mayans  05/30/2020   Procedure: Joan Mayans;  Surgeon: [name], MD;  Location: [facility];  Service: Endoscopy;;  . WISDOM TOOTH EXTRACTION      Social History: Social History   Socioeconomic History  . Marital status: Married    Spouse name: Not on file  . Number of children: 2  . Years of education: Not on file  . Highest education level: Not on file  Occupational History  . Occupation: Nurse Tourist information centre manager  Tobacco Use  . Smoking status: Never Smoker  . Smokeless tobacco: Never Used  Vaping Use  . Vaping Use: Never used  Substance and Sexual Activity  . Alcohol use: Not Currently    Comment: twice a year  . Drug use: No  . Sexual activity: Yes    Birth control/protection: I.U.D.    Comment: Mirena IUD-Inserted 2018  Other Topics Concern  . Not on file  Social History Narrative   Occupation: Therapist, sports - [employer]   Married - 2 kids (30 and 8 in 2016)   Regular exercise- yes   Eating a healthy diet   Spiritual: catholic   Social Determinants of  Health   Financial Resource Strain:   . Difficulty of Paying Living Expenses:   Food Insecurity:   . Worried About Charity fundraiser in the Last Year:   . Arboriculturist in the Last Year:   Transportation Needs:   . Film/video editor (Medical):   Marland Kitchen Lack of Transportation (Non-Medical):   Physical Activity:   . Days of Exercise per Week:   . Minutes of Exercise per Session:   Stress:   . Feeling of Stress :   Social Connections:   . Frequency of Communication with Friends and Family:   . Frequency of Social Gatherings with Friends and Family:   . Attends Religious Services:   . Active Member of Clubs or Organizations:   . Attends Archivist Meetings:   Marland Kitchen Marital Status:   Intimate Partner Violence:   . Fear of Current  or Ex-Partner:   . Emotionally Abused:   Marland Kitchen Physically Abused:   . Sexually Abused:     Family History: Family History  Problem Relation Age of Onset  . Hypertension Mother   . Breast cancer Mother 16  . Hyperlipidemia Father   . Diabetes Father   . Crohn's disease Father   . Stroke Maternal Grandmother   . Diabetes Maternal Grandmother   . Prostate cancer Paternal Uncle   . Colon cancer Paternal Grandmother   . Breast cancer Other   . Rectal cancer Neg Hx   . Stomach cancer Neg Hx   . Esophageal cancer Neg Hx     Review of Systems: Review of Systems  Constitutional: Negative.   Cardiovascular: Negative.   Gastrointestinal: Negative.   Neurological: Negative.     Physical Exam: Vital Signs BP 122/77 (BP Location: Left Arm, Patient Position: Sitting, Cuff Size: Normal)   Pulse 86   Temp (!) 97.3 F (36.3 C) (Temporal)   Ht 5\' 5"  (1.651 m)   Wt 165 lb (74.8 kg)   SpO2 99%   BMI 27.46 kg/m  Physical Exam Constitutional:      General: She is not in acute distress.    Appearance: Normal appearance. She is not ill-appearing.  HENT:     Head: Normocephalic and atraumatic.  Eyes:     Pupils: Pupils are equal, round Neck:       Musculoskeletal: Normal range of motion.  Cardiovascular:     Rate and Rhythm: Normal rate and regular rhythm.     Pulses: Normal pulses.     Heart sounds: Normal heart sounds. No murmur.  Pulmonary:     Effort: Pulmonary effort is normal. No respiratory distress.     Breath sounds: Normal breath sounds. No wheezing.  Abdominal:     General: Abdomen is flat. There is no distension.  Musculoskeletal: Normal range of motion.  Skin:    General: Skin is warm and dry.     Findings: No erythema or rash.  Neurological:     General: No focal deficit present.     Mental Status: She is alert and oriented to person, place, and time. Mental status is at baseline.     Motor: No weakness.  Psychiatric:        Mood and Affect: Mood normal.        Behavior: Behavior normal.   Assessment/Plan: Patient is scheduled for removal of bilateral tissue expanders placement of bilateral breast implants with Dr. Marla Roe on 07/10/2020.  Risks, benefits, and alternatives of procedure discussed, questions answered and consent obtained.    Smoking Status: Non-smoker Last Mammogram: Patient underwent bilateral mastectomy  Caprini Score: High; Risk Factors include: History of breast cancer, recent surgery less than 1 month ago, age, BMI greater than 25, and length of planned surgery. Recommendation for mechanical and pharmacological prophylaxis during surgery. Encourage early ambulation.   Pictures obtained: 06/11/2020  Post-op Rx sent to pharmacy: Keflex x5 days.   Patient was provided with the General Surgical Risk consent document and Pain Medication Agreement prior to their appointment.  They had adequate time to read through the risk consent documents and Pain Medication Agreement. We also discussed them in person together during this preop appointment. All of their questions were answered to their satisfaction.  Recommended calling if they have any further questions.  Risk consent form and Pain  Medication Agreement to be scanned into patient's chart.  The risks that can be encountered with and after  placement of a breast implant placement were discussed and include the following but not limited to these: bleeding, infection, delayed healing, anesthesia risks, skin sensation changes, injury to structures including nerves, blood vessels, and muscles which may be temporary or permanent, allergies to tape, suture materials and glues, blood products, topical preparations or injected agents, skin contour irregularities, skin discoloration and swelling, deep vein thrombosis, cardiac and pulmonary complications, pain, which may persist, fluid accumulation, wrinkling of the skin over the implant, changes in nipple or breast sensation, implant leakage or rupture, faulty position of the implant, persistent pain, formation of tight scar tissue around the implant (capsular contracture), possible need for revisional surgery or staged procedures.  Call with questions or concerns.  Electronically signed by: Carola Rhine Adrie Picking, PA-C 06/25/2020 9:24 AM

## 2020-06-28 ENCOUNTER — Ambulatory Visit: Payer: 59 | Admitting: Plastic Surgery

## 2020-07-02 ENCOUNTER — Other Ambulatory Visit: Payer: Self-pay

## 2020-07-02 ENCOUNTER — Encounter (HOSPITAL_BASED_OUTPATIENT_CLINIC_OR_DEPARTMENT_OTHER): Payer: Self-pay | Admitting: Plastic Surgery

## 2020-07-06 ENCOUNTER — Other Ambulatory Visit (HOSPITAL_COMMUNITY)
Admission: RE | Admit: 2020-07-06 | Discharge: 2020-07-06 | Disposition: A | Discharge status: Home / Self Care | Payer: 59 | Source: Ambulatory Visit | Attending: Plastic Surgery | Admitting: Plastic Surgery

## 2020-07-06 DIAGNOSIS — Z01812 Encounter for preprocedural laboratory examination: Secondary | ICD-10-CM | POA: Diagnosis not present

## 2020-07-06 DIAGNOSIS — Z20822 Contact with and (suspected) exposure to covid-19: Secondary | ICD-10-CM | POA: Diagnosis not present

## 2020-07-06 LAB — SARS CORONAVIRUS 2 (TAT 6-24 HRS): SARS Coronavirus 2: NEGATIVE

## 2020-07-08 NOTE — Progress Notes (Signed)
Pt's husband arrived to pick up CHG soap. Bottle of CHG given with instructions for use, verbalized understanding.

## 2020-07-10 ENCOUNTER — Encounter (HOSPITAL_BASED_OUTPATIENT_CLINIC_OR_DEPARTMENT_OTHER)
Admission: RE | Disposition: A | Discharge status: Home / Self Care | Payer: Self-pay | Source: Home / Self Care | Attending: Plastic Surgery

## 2020-07-10 ENCOUNTER — Ambulatory Visit (HOSPITAL_BASED_OUTPATIENT_CLINIC_OR_DEPARTMENT_OTHER)
Admission: RE | Admit: 2020-07-10 | Discharge: 2020-07-10 | Disposition: A | Discharge status: Home / Self Care | Payer: 59 | Attending: Plastic Surgery | Admitting: Plastic Surgery

## 2020-07-10 ENCOUNTER — Ambulatory Visit (HOSPITAL_BASED_OUTPATIENT_CLINIC_OR_DEPARTMENT_OTHER): Payer: 59 | Admitting: Anesthesiology

## 2020-07-10 ENCOUNTER — Other Ambulatory Visit: Payer: Self-pay

## 2020-07-10 ENCOUNTER — Encounter (HOSPITAL_BASED_OUTPATIENT_CLINIC_OR_DEPARTMENT_OTHER): Payer: Self-pay | Admitting: Plastic Surgery

## 2020-07-10 DIAGNOSIS — C50411 Malignant neoplasm of upper-outer quadrant of right female breast: Secondary | ICD-10-CM

## 2020-07-10 DIAGNOSIS — Z853 Personal history of malignant neoplasm of breast: Secondary | ICD-10-CM | POA: Diagnosis not present

## 2020-07-10 DIAGNOSIS — Z9013 Acquired absence of bilateral breasts and nipples: Secondary | ICD-10-CM | POA: Insufficient documentation

## 2020-07-10 DIAGNOSIS — Z45811 Encounter for adjustment or removal of right breast implant: Secondary | ICD-10-CM | POA: Diagnosis not present

## 2020-07-10 DIAGNOSIS — Z45812 Encounter for adjustment or removal of left breast implant: Secondary | ICD-10-CM | POA: Diagnosis not present

## 2020-07-10 DIAGNOSIS — Z9889 Other specified postprocedural states: Secondary | ICD-10-CM

## 2020-07-10 DIAGNOSIS — Z170 Estrogen receptor positive status [ER+]: Secondary | ICD-10-CM

## 2020-07-10 HISTORY — DX: Nausea with vomiting, unspecified: R11.2

## 2020-07-10 HISTORY — PX: REMOVAL OF BILATERAL TISSUE EXPANDERS WITH PLACEMENT OF BILATERAL BREAST IMPLANTS: SHX6431

## 2020-07-10 HISTORY — DX: Other specified postprocedural states: Z98.890

## 2020-07-10 LAB — POCT PREGNANCY, URINE: Preg Test, Ur: NEGATIVE

## 2020-07-10 SURGERY — REMOVAL, TISSUE EXPANDER, BREAST, BILATERAL, WITH BILATERAL IMPLANT IMPLANT INSERTION
Anesthesia: General | Site: Breast | Laterality: Bilateral

## 2020-07-10 MED ORDER — OXYCODONE HCL 5 MG PO TABS: 5.0000 mg | ORAL_TABLET | Freq: Once | ORAL | Status: DC | PRN

## 2020-07-10 MED ORDER — LACTATED RINGERS IV SOLN: INTRAVENOUS | Status: DC

## 2020-07-10 MED ORDER — SODIUM CHLORIDE 0.9 % IV SOLN
INTRAVENOUS | Status: DC | PRN
  Administered 2020-07-10: 500 mL

## 2020-07-10 MED ORDER — PROPOFOL 10 MG/ML IV BOLUS
INTRAVENOUS | Status: DC | PRN
  Administered 2020-07-10: 180 mg via INTRAVENOUS

## 2020-07-10 MED ORDER — SCOPOLAMINE 1 MG/3DAYS TD PT72
1.0000 | MEDICATED_PATCH | TRANSDERMAL | Status: DC
  Administered 2020-07-10: 1.5 mg via TRANSDERMAL

## 2020-07-10 MED ORDER — SODIUM CHLORIDE 0.9 % IV SOLN: 250.0000 mL | INTRAVENOUS | Status: DC | PRN

## 2020-07-10 MED ORDER — FENTANYL CITRATE (PF) 100 MCG/2ML IJ SOLN
INTRAMUSCULAR | Status: DC | PRN
  Administered 2020-07-10 (×4): 25 ug via INTRAVENOUS

## 2020-07-10 MED ORDER — OXYCODONE HCL 5 MG/5ML PO SOLN: 5.0000 mg | Freq: Once | ORAL | Status: DC | PRN

## 2020-07-10 MED ORDER — MORPHINE SULFATE (PF) 4 MG/ML IV SOLN: 2.0000 mg | INTRAVENOUS | Status: DC | PRN

## 2020-07-10 MED ORDER — CEFAZOLIN SODIUM-DEXTROSE 2-4 GM/100ML-% IV SOLN
INTRAVENOUS | Status: AC
  Filled 2020-07-10: qty 100

## 2020-07-10 MED ORDER — EPHEDRINE SULFATE 50 MG/ML IJ SOLN
INTRAMUSCULAR | Status: DC | PRN
  Administered 2020-07-10: 10 mg via INTRAVENOUS
  Administered 2020-07-10: 5 mg via INTRAVENOUS

## 2020-07-10 MED ORDER — ACETAMINOPHEN 325 MG PO TABS: 650.0000 mg | ORAL_TABLET | ORAL | Status: DC | PRN

## 2020-07-10 MED ORDER — CHLORHEXIDINE GLUCONATE CLOTH 2 % EX PADS: 6.0000 | MEDICATED_PAD | Freq: Once | CUTANEOUS | Status: DC

## 2020-07-10 MED ORDER — ONDANSETRON HCL 4 MG/2ML IJ SOLN
INTRAMUSCULAR | Status: DC | PRN
  Administered 2020-07-10: 4 mg via INTRAVENOUS

## 2020-07-10 MED ORDER — DEXAMETHASONE SODIUM PHOSPHATE 10 MG/ML IJ SOLN
INTRAMUSCULAR | Status: DC | PRN
  Administered 2020-07-10: 5 mg via INTRAVENOUS

## 2020-07-10 MED ORDER — FENTANYL CITRATE (PF) 100 MCG/2ML IJ SOLN
25.0000 ug | INTRAMUSCULAR | Status: DC | PRN
  Administered 2020-07-10: 25 ug via INTRAVENOUS

## 2020-07-10 MED ORDER — SODIUM CHLORIDE 0.9% FLUSH: 3.0000 mL | INTRAVENOUS | Status: DC | PRN

## 2020-07-10 MED ORDER — MIDAZOLAM HCL 5 MG/5ML IJ SOLN
INTRAMUSCULAR | Status: DC | PRN
  Administered 2020-07-10: 2 mg via INTRAVENOUS

## 2020-07-10 MED ORDER — LIDOCAINE HCL (CARDIAC) PF 100 MG/5ML IV SOSY
PREFILLED_SYRINGE | INTRAVENOUS | Status: DC | PRN
  Administered 2020-07-10: 60 mg via INTRAVENOUS

## 2020-07-10 MED ORDER — OXYCODONE HCL 5 MG PO TABS: 5.0000 mg | ORAL_TABLET | ORAL | Status: DC | PRN

## 2020-07-10 MED ORDER — CEFAZOLIN SODIUM-DEXTROSE 2-4 GM/100ML-% IV SOLN
2.0000 g | INTRAVENOUS | Status: AC
  Administered 2020-07-10: 2 g via INTRAVENOUS

## 2020-07-10 MED ORDER — FENTANYL CITRATE (PF) 100 MCG/2ML IJ SOLN
INTRAMUSCULAR | Status: AC
  Filled 2020-07-10: qty 2

## 2020-07-10 MED ORDER — SODIUM CHLORIDE 0.9% FLUSH: 3.0000 mL | Freq: Two times a day (BID) | INTRAVENOUS | Status: DC

## 2020-07-10 MED ORDER — ONDANSETRON HCL 4 MG/2ML IJ SOLN: 4.0000 mg | Freq: Four times a day (QID) | INTRAMUSCULAR | Status: DC | PRN

## 2020-07-10 MED ORDER — MIDAZOLAM HCL 2 MG/2ML IJ SOLN
INTRAMUSCULAR | Status: AC
  Filled 2020-07-10: qty 2

## 2020-07-10 MED ORDER — ACETAMINOPHEN 325 MG RE SUPP: 650.0000 mg | RECTAL | Status: DC | PRN

## 2020-07-10 MED ORDER — EPHEDRINE 5 MG/ML INJ
INTRAVENOUS | Status: AC
  Filled 2020-07-10: qty 20

## 2020-07-10 MED ORDER — SCOPOLAMINE 1 MG/3DAYS TD PT72
MEDICATED_PATCH | TRANSDERMAL | Status: AC
  Filled 2020-07-10: qty 1

## 2020-07-10 MED ORDER — LIDOCAINE-EPINEPHRINE 1 %-1:100000 IJ SOLN
INTRAMUSCULAR | Status: DC | PRN
  Administered 2020-07-10: 17 mL via INTRADERMAL

## 2020-07-10 MED ORDER — CHLORHEXIDINE GLUCONATE 4 % EX LIQD: Freq: Once | CUTANEOUS | Status: DC

## 2020-07-10 SURGICAL SUPPLY — 73 items
ADH SKN CLS APL DERMABOND .7 (GAUZE/BANDAGES/DRESSINGS) ×2
BAG DECANTER FOR FLEXI CONT (MISCELLANEOUS) ×3 IMPLANT
BINDER BREAST LRG (GAUZE/BANDAGES/DRESSINGS) IMPLANT
BINDER BREAST MEDIUM (GAUZE/BANDAGES/DRESSINGS) IMPLANT
BINDER BREAST XLRG (GAUZE/BANDAGES/DRESSINGS) IMPLANT
BINDER BREAST XXLRG (GAUZE/BANDAGES/DRESSINGS) IMPLANT
BIOPATCH RED 1 DISK 7.0 (GAUZE/BANDAGES/DRESSINGS) IMPLANT
BIOPATCH RED 1IN DISK 7.0MM (GAUZE/BANDAGES/DRESSINGS)
BLADE HEX COATED 2.75 (ELECTRODE) ×3 IMPLANT
BLADE SURG 15 STRL LF DISP TIS (BLADE) ×2 IMPLANT
BLADE SURG 15 STRL SS (BLADE) ×6
CANISTER SUCT 1200ML W/VALVE (MISCELLANEOUS) ×3 IMPLANT
CORD BIPOLAR FORCEPS 12FT (ELECTRODE) IMPLANT
COVER BACK TABLE 60X90IN (DRAPES) ×3 IMPLANT
COVER MAYO STAND STRL (DRAPES) ×3 IMPLANT
COVER WAND RF STERILE (DRAPES) IMPLANT
DECANTER SPIKE VIAL GLASS SM (MISCELLANEOUS) IMPLANT
DERMABOND ADVANCED (GAUZE/BANDAGES/DRESSINGS) ×4
DERMABOND ADVANCED .7 DNX12 (GAUZE/BANDAGES/DRESSINGS) IMPLANT
DRAIN CHANNEL 19F RND (DRAIN) IMPLANT
DRAPE LAPAROSCOPIC ABDOMINAL (DRAPES) ×3 IMPLANT
DRSG OPSITE POSTOP 4X6 (GAUZE/BANDAGES/DRESSINGS) ×4 IMPLANT
DRSG PAD ABDOMINAL 8X10 ST (GAUZE/BANDAGES/DRESSINGS) ×6 IMPLANT
ELECT BLADE 4.0 EZ CLEAN MEGAD (MISCELLANEOUS) ×3
ELECT REM PT RETURN 9FT ADLT (ELECTROSURGICAL) ×3
ELECTRODE BLDE 4.0 EZ CLN MEGD (MISCELLANEOUS) ×1 IMPLANT
ELECTRODE REM PT RTRN 9FT ADLT (ELECTROSURGICAL) ×1 IMPLANT
EVACUATOR SILICONE 100CC (DRAIN) IMPLANT
FUNNEL KELLER 2 DISP (MISCELLANEOUS) ×2 IMPLANT
GAUZE SPONGE 4X4 12PLY STRL LF (GAUZE/BANDAGES/DRESSINGS) IMPLANT
GLOVE BIO SURGEON STRL SZ 6.5 (GLOVE) ×6 IMPLANT
GLOVE BIO SURGEON STRL SZ7 (GLOVE) ×3 IMPLANT
GLOVE BIO SURGEONS STRL SZ 6.5 (GLOVE) ×3
GOWN STRL REUS W/ TWL LRG LVL3 (GOWN DISPOSABLE) ×2 IMPLANT
GOWN STRL REUS W/TWL LRG LVL3 (GOWN DISPOSABLE) ×6
IMPL BREAST P5.9XHI RND 450 (Breast) IMPLANT
IMPL BRST P5.9XHI RND 450CC (Breast) ×2 IMPLANT
IMPLANT BREAST GEL 450CC (Breast) ×6 IMPLANT
IV NS 1000ML (IV SOLUTION)
IV NS 1000ML BAXH (IV SOLUTION) IMPLANT
IV NS 500ML (IV SOLUTION)
IV NS 500ML BAXH (IV SOLUTION) IMPLANT
KIT FILL SYSTEM UNIVERSAL (SET/KITS/TRAYS/PACK) IMPLANT
NDL HYPO 25X1 1.5 SAFETY (NEEDLE) ×1 IMPLANT
NDL SAFETY ECLIPSE 18X1.5 (NEEDLE) ×1 IMPLANT
NEEDLE HYPO 18GX1.5 SHARP (NEEDLE) ×3
NEEDLE HYPO 25X1 1.5 SAFETY (NEEDLE) ×3 IMPLANT
PACK BASIN DAY SURGERY FS (CUSTOM PROCEDURE TRAY) ×3 IMPLANT
PENCIL SMOKE EVACUATOR (MISCELLANEOUS) ×3 IMPLANT
PIN SAFETY STERILE (MISCELLANEOUS) IMPLANT
SIZER BREAST REUSE 415CC (SIZER) ×3
SIZER BREAST REUSE 450CC (SIZER) ×3
SIZER BRST REUSE 450CC (SIZER) IMPLANT
SIZER BRST REUSE P5.8XHI 415CC (SIZER) IMPLANT
SLEEVE SCD COMPRESS KNEE MED (MISCELLANEOUS) ×3 IMPLANT
SPONGE LAP 18X18 RF (DISPOSABLE) ×6 IMPLANT
STRIP SUTURE WOUND CLOSURE 1/2 (MISCELLANEOUS) ×2 IMPLANT
SUT MNCRL AB 4-0 PS2 18 (SUTURE) ×10 IMPLANT
SUT MON AB 3-0 SH 27 (SUTURE) ×12
SUT MON AB 3-0 SH27 (SUTURE) ×4 IMPLANT
SUT MON AB 5-0 PS2 18 (SUTURE) ×5 IMPLANT
SUT PDS AB 2-0 CT2 27 (SUTURE) IMPLANT
SUT VIC AB 3-0 SH 27 (SUTURE)
SUT VIC AB 3-0 SH 27X BRD (SUTURE) IMPLANT
SUT VICRYL 4-0 PS2 18IN ABS (SUTURE) IMPLANT
SYR BULB IRRIG 60ML STRL (SYRINGE) ×5 IMPLANT
SYR CONTROL 10ML LL (SYRINGE) ×3 IMPLANT
TOWEL GREEN STERILE FF (TOWEL DISPOSABLE) ×6 IMPLANT
TRAY DSU PREP LF (CUSTOM PROCEDURE TRAY) ×3 IMPLANT
TUBE CONNECTING 20'X1/4 (TUBING) ×1
TUBE CONNECTING 20X1/4 (TUBING) ×2 IMPLANT
UNDERPAD 30X36 HEAVY ABSORB (UNDERPADS AND DIAPERS) ×6 IMPLANT
YANKAUER SUCT BULB TIP NO VENT (SUCTIONS) ×3 IMPLANT

## 2020-07-10 NOTE — Interval H&P Note (Signed)
History and Physical Interval Note:  07/10/2020 12:53 PM  Megan [name]  has presented today for surgery, with the diagnosis of Status Post Bilateral Breast Reconstruction.  The various methods of treatment have been discussed with the patient and family. After consideration of risks, benefits and other options for treatment, the patient has consented to  Procedure(s): REMOVAL OF BILATERAL TISSUE EXPANDERS WITH PLACEMENT OF BILATERAL BREAST IMPLANTS (Bilateral) as a surgical intervention.  The patient's history has been reviewed, patient examined, no change in status, stable for surgery.  I have reviewed the patient's chart and labs.  Questions were answered to the patient's satisfaction.     Loel Lofty Megan [name]

## 2020-07-10 NOTE — Discharge Instructions (Addendum)
INSTRUCTIONS FOR AFTER SURGERY   You will likely have some questions about what to expect following your operation.  The following information will help you and your family understand what to expect when you are discharged from the hospital.  Following these guidelines will help ensure a smooth recovery and reduce risks of complications.  Postoperative instructions include information on: diet, wound care, medications and physical activity.  AFTER SURGERY Expect to go home after the procedure.  In some cases, you may need to spend one night in the hospital for observation.  DIET This surgery does not require a specific diet.  However, I have to mention that the healthier you eat the better your body can start healing. It is important to increasing your protein intake.  This means limiting the foods with added sugar.  Focus on fruits and vegetables and some meat.  If you have any liposuction during your procedure be sure to drink water.  If your urine is bright yellow, then it is concentrated, and you need to drink more water.  As a general rule after surgery, you should have 8 ounces of water every hour while awake.  If you find you are persistently nauseated or unable to take in liquids let us know.  NO TOBACCO USE or EXPOSURE.  This will slow your healing process and increase the risk of a wound.  WOUND CARE If you don't have a drain: You can shower the day after surgery.  Use fragrance free soap.  Dial, Dove, Ivory and Cetaphil are usually mild on the skin.  If you have steri-strips / tape directly attached to your skin leave them in place. It is OK to get these wet.  No baths, pools or hot tubs for two weeks. We close your incision to leave the smallest and best-looking scar. No ointment or creams on your incisions until given the go ahead.  Especially not Neosporin (Too many skin reactions with this one).  A few weeks after surgery you can use Mederma and start massaging the scar. We ask you to  wear your binder or sports bra for the first 6 weeks around the clock, including while sleeping. This provides added comfort and helps reduce the fluid accumulation at the surgery site.  ACTIVITY No heavy lifting until cleared by the doctor.  It is OK to walk and climb stairs. In fact, moving your legs is very important to decrease your risk of a blood clot.  It will also help keep you from getting deconditioned.  Every 1 to 2 hours get up and walk for 5 minutes. This will help with a quicker recovery back to normal.  Let pain be your guide so you don't do too much.  NO, you cannot do the spring cleaning and don't plan on taking care of anyone else.  This is your time for TLC.   WORK Everyone returns to work at different times. As a rough guide, most people take at least 1 - 2 weeks off prior to returning to work. If you need documentation for your job, bring the forms to your postoperative follow up visit.  DRIVING Arrange for someone to bring you home from the hospital.  You may be able to drive a few days after surgery but not while taking any narcotics or valium.  BOWEL MOVEMENTS Constipation can occur after anesthesia and while taking pain medication.  It is important to stay ahead for your comfort.  We recommend taking Milk of Magnesia (2 tablespoons; twice   a day) while taking the pain pills.  SEROMA This is fluid your body tried to put in the surgical site.  This is normal but if it creates excessive pain and swelling let us know.  It usually decreases in a few weeks.  MEDICATIONS and PAIN CONTROL At your preoperative visit for you history and physical you were given the following medications: 1. An antibiotic: Start this medication when you get home and take according to the instructions on the bottle. 2. Zofran 4 mg:  This is to treat nausea and vomiting.  You can take this every 6 hours as needed and only if needed. 3. Norco (hydrocodone/acetaminophen) 5/325 mg:  This is only to be  used after you have taken the motrin or the tylenol. Every 8 hours as needed. Over the counter Medication to take: 4. Ibuprofen (Motrin) 600 mg:  Take this every 6 hours.  If you have additional pain then take 500 mg of the tylenol.  Only take the Norco after you have tried these two. 5. Miralax or stool softener of choice: Take this according to the bottle if you take the Norco.  WHEN TO CALL Call your surgeon's office if any of the following occur: . Fever 101 degrees F or greater . Excessive bleeding or fluid from the incision site. . Pain that increases over time without aid from the medications . Redness, warmth, or pus draining from incision sites . Persistent nausea or inability to take in liquids . Severe misshapen area that underwent the operation.   Post Anesthesia Home Care Instructions  Activity: Get plenty of rest for the remainder of the day. A responsible individual must stay with you for 24 hours following the procedure.  For the next 24 hours, DO NOT: -Drive a car -Operate machinery -Drink alcoholic beverages -Take any medication unless instructed by your physician -Make any legal decisions or sign important papers.  Meals: Start with liquid foods such as gelatin or soup. Progress to regular foods as tolerated. Avoid greasy, spicy, heavy foods. If nausea and/or vomiting occur, drink only clear liquids until the nausea and/or vomiting subsides. Call your physician if vomiting continues.  Special Instructions/Symptoms: Your throat may feel dry or sore from the anesthesia or the breathing tube placed in your throat during surgery. If this causes discomfort, gargle with warm salt water. The discomfort should disappear within 24 hours.  If you had a scopolamine patch placed behind your ear for the management of post- operative nausea and/or vomiting:  1. The medication in the patch is effective for 72 hours, after which it should be removed.  Wrap patch in a tissue and  discard in the trash. Wash hands thoroughly with soap and water. 2. You may remove the patch earlier than 72 hours if you experience unpleasant side effects which may include dry mouth, dizziness or visual disturbances. 3. Avoid touching the patch. Wash your hands with soap and water after contact with the patch.     

## 2020-07-10 NOTE — Transfer of Care (Signed)
Immediate Anesthesia Transfer of Care Note  Patient: Megan [name]  Procedure(s) Performed: REMOVAL OF BILATERAL TISSUE EXPANDERS WITH PLACEMENT OF BILATERAL BREAST IMPLANTS (Bilateral Breast)  Patient Location: PACU  Anesthesia Type:General  Level of Consciousness: drowsy  Airway & Oxygen Therapy: Patient Spontanous Breathing and Patient connected to face mask oxygen  Post-op Assessment: Report given to RN and Post -op Vital signs reviewed and stable  Post vital signs: Reviewed and stable  Last Vitals:  Vitals Value Taken Time  BP 128/67 07/10/20 1521  Temp    Pulse 85 07/10/20 1524  Resp 19 07/10/20 1524  SpO2 100 % 07/10/20 1524  Vitals shown include unvalidated device data.  Last Pain:  Vitals:   07/10/20 1122  TempSrc: Oral  PainSc: 0-No pain         Complications: No complications documented.

## 2020-07-10 NOTE — Anesthesia Procedure Notes (Signed)
Procedure Name: LMA Insertion Date/Time: 07/10/2020 1:29 PM Performed by: Lavonia Dana, CRNA Pre-anesthesia Checklist: Patient identified, Emergency Drugs available, Suction available and Patient being monitored Patient Re-evaluated:Patient Re-evaluated prior to induction Oxygen Delivery Method: Circle system utilized Preoxygenation: Pre-oxygenation with 100% oxygen Induction Type: IV induction Ventilation: Mask ventilation without difficulty LMA: LMA inserted LMA Size: 4.0 Number of attempts: 1 Airway Equipment and Method: Bite block Placement Confirmation: positive ETCO2 Tube secured with: Tape Dental Injury: Teeth and Oropharynx as per pre-operative assessment

## 2020-07-10 NOTE — Op Note (Signed)
Op report Bilateral Exchange   DATE OF OPERATION: 07/10/2020  LOCATION: [facility]  SURGICAL DIVISION: Plastic Surgery  PREOPERATIVE DIAGNOSES:  1.History of breast cancer.  2. Acquired absence of bilateral breast.   POSTOPERATIVE DIAGNOSES:  1. History of breast cancer.  2. Acquired absence of bilateral breast.   PROCEDURE:  1. Bilateral exchange of tissue expanders for implants.  2. Bilateral capsulotomies for implant respositioning.  SURGEON: Zai Chmiel Sanger Millie Shorb, DO  ASSISTANT: Phoebe Sharps, PA  ANESTHESIA:  General.   COMPLICATIONS: None.   IMPLANTS: Left - Mentor Memory Smooth High Profile Xtra Gel 450 cc. Ref #-[id number].  Serial Number [id number] Right - Mentor Memory Smooth High Profile Xtra Gel 450 cc. Ref #[id number].  Serial Number [id number]  INDICATIONS FOR PROCEDURE:  The patient, Megan [name], is a 49 y.o. female born on 1971/02/15, is here for treatment after bilateral mastectomies.  She had tissue expanders placed at the time of mastectomies. She now presents for exchange of her expanders for implants.  She requires capsulotomies to better position the implants. MRN: [id number]  CONSENT:  Informed consent was obtained directly from the patient. Risks, benefits and alternatives were fully discussed. Specific risks including but not limited to bleeding, infection, hematoma, seroma, scarring, pain, implant infection, implant extrusion, capsular contracture, asymmetry, wound healing problems, and need for further surgery were all discussed. The patient did have an ample opportunity to have her questions answered to her satisfaction.   DESCRIPTION OF PROCEDURE:  The patient was taken to the operating room. SCDs were placed and IV antibiotics were given. The patient's chest was prepped and draped in a sterile fashion. A time out was performed and the implants to be used were identified.    On the right breast: One percent Lidocaine with  epinephrine was used to infiltrate at the incision site. The old mastectomy scar was incised.  The mastectomy flaps from the superior and inferior flaps were raised over the capsule several centimeters to minimize tension for the closure. The capsule was split to expose and remove the tissue expander.  Inspection of the pocket showed a normal healthy capsule and good integration of the biologic matrix.  The pocket was irrigated with antibiotic solution.  There was a thick flap at the medial aspect.  A very small amount of liposuction was done with a blunt canula and the 10 cc syringe.    Extensive circumferential capsulotomies were performed to allow for breast pocket expansion.  Measurements were made and a sizer used to confirm adequate pocket size for the implant dimensions.  Hemostasis was ensured with electrocautery. New gloves were placed. The implant was soaked in antibiotic solution and then placed in the pocket and oriented appropriately. The capsule was closed with a 3-0 Monocryl suture. The remaining skin was closed with 4-0 Monocryl deep dermal and 5-0 Monocryl subcuticular stitches.   On the left breast: The old mastectomy scar was incised.  The mastectomy flaps from the superior and inferior flaps were raised over the capsule several centimeters to minimize tension for the closure. The capsule was split to expose and remove the tissue expander.  Inspection of the pocket showed a normal healthy capsule and good integration of the biologic matrix. There was a thick flap at the medial aspect.  A very small amount of liposuction was done with a blunt canula and the 10 cc syringe.    Extensive circumferential capsulotomies were performed to allow for breast pocket expansion.  Measurements were made and a  sizer utilized to confirm adequate pocket size for the implant dimensions.  Hemostasis was ensured with the electrocautery.  New gloves were applied. The implant was soaked in antibiotic solution and  placed in the pocket and oriented appropriately with the keller funnel for both sides. The capsule was closed with a 3-0 Monocryl suture. The remaining skin was closed with 4-0 Monocryl deep dermal and 5-0 Monocryl subcuticular stitches.  Dermabond was applied to the incision site. A breast binder and ABDs were placed.  The patient was allowed to wake from anesthesia and taken to the recovery room in satisfactory condition.   The advanced practice practitioner (APP) assisted throughout the case.  The APP was essential in retraction and counter traction when needed to make the case progress smoothly.  This retraction and assistance made it possible to see the tissue plans for the procedure.  The assistance was needed for blood control, tissue re-approximation and assisted with closure of the incision site.

## 2020-07-10 NOTE — Anesthesia Postprocedure Evaluation (Signed)
Anesthesia Post Note  Patient: Megan [name]  Procedure(s) Performed: REMOVAL OF BILATERAL TISSUE EXPANDERS WITH PLACEMENT OF BILATERAL BREAST IMPLANTS (Bilateral Breast)     Patient location during evaluation: PACU Anesthesia Type: General Level of consciousness: awake and alert Pain management: pain level controlled Vital Signs Assessment: post-procedure vital signs reviewed and stable Respiratory status: spontaneous breathing, nonlabored ventilation, respiratory function stable and patient connected to nasal cannula oxygen Cardiovascular status: blood pressure returned to baseline and stable Postop Assessment: no apparent nausea or vomiting Anesthetic complications: no   No complications documented.  Last Vitals:  Vitals:   07/10/20 1600 07/10/20 1615  BP: 126/75 127/80  Pulse: 78 75  Resp: 12 12  Temp:    SpO2: 96% 97%    Last Pain:  Vitals:   07/10/20 1615  TempSrc:   PainSc: 4                  Catricia Scheerer S

## 2020-07-10 NOTE — Anesthesia Preprocedure Evaluation (Signed)
Anesthesia Evaluation  Patient identified by MRN, date of birth, ID band Patient awake    Reviewed: Allergy & Precautions, H&P , NPO status , Patient's Chart, lab work & pertinent test results  History of Anesthesia Complications (+) PONV and history of anesthetic complications  Airway Mallampati: II   Neck ROM: full    Dental   Pulmonary neg pulmonary ROS,    breath sounds clear to auscultation       Cardiovascular negative cardio ROS   Rhythm:regular Rate:Normal     Neuro/Psych    GI/Hepatic GERD  ,  Endo/Other    Renal/GU      Musculoskeletal   Abdominal   Peds  Hematology   Anesthesia Other Findings   Reproductive/Obstetrics H/o breast Ca                             Anesthesia Physical Anesthesia Plan  ASA: II  Anesthesia Plan: General   Post-op Pain Management:    Induction:   PONV Risk Score and Plan: 4 or greater and Ondansetron, Dexamethasone, Midazolam, Scopolamine patch - Pre-op and Treatment may vary due to age or medical condition  Airway Management Planned: LMA  Additional Equipment:   Intra-op Plan:   Post-operative Plan: Extubation in OR  Informed Consent: I have reviewed the patients History and Physical, chart, labs and discussed the procedure including the risks, benefits and alternatives for the proposed anesthesia with the patient or authorized representative who has indicated his/her understanding and acceptance.       Plan Discussed with: CRNA, Anesthesiologist and Surgeon  Anesthesia Plan Comments:         Anesthesia Quick Evaluation

## 2020-07-11 ENCOUNTER — Telehealth: Payer: Self-pay

## 2020-07-11 ENCOUNTER — Encounter (HOSPITAL_BASED_OUTPATIENT_CLINIC_OR_DEPARTMENT_OTHER): Payer: Self-pay | Admitting: Plastic Surgery

## 2020-07-11 NOTE — Telephone Encounter (Signed)
Please arrange for MRI with and without contrast to follow-up on the small hepatic masses on CT scan. Arrange follow-up visit with me - next available whenever that may be - to review the MRI results. Thank you.      Left message on machine to call back

## 2020-07-15 ENCOUNTER — Other Ambulatory Visit: Payer: Self-pay

## 2020-07-15 DIAGNOSIS — K769 Liver disease, unspecified: Secondary | ICD-10-CM

## 2020-07-15 NOTE — Telephone Encounter (Signed)
You have been scheduled for an MRI at [facility] on 07/23/20. Your appointment time is 10 am. Please arrive 30 minutes prior to your appointment time for registration purposes. Please make certain not to have anything to eat or drink 4 hours prior to your test. In addition, if you have any metal in your body, have a pacemaker or defibrillator, please be sure to let your ordering physician know. This test typically takes 45 minutes to 1 hour to complete. Should you need to reschedule, please call [phone] to do so.

## 2020-07-15 NOTE — Telephone Encounter (Signed)
The pt has been advised of the appts and all questions answered.

## 2020-07-15 NOTE — Telephone Encounter (Signed)
10/4 21 at 130 pm appt with Dr Tarri Glenn

## 2020-07-17 NOTE — Progress Notes (Signed)
Subjective:     Patient ID: Megan [name], female    DOB: 1971/11/06, 49 y.o.   MRN: [id number]  Chief Complaint  Patient presents with  . Post-op Follow-up    HPI: The patient is a 49 y.o. female here for follow-up after undergoing bilateral exchange of tissue expanders for silicone implants and bilateral capsulotomies for implant repositioning on 07/10/2020 with Dr. Marla Roe. (Mentor memory smooth high profile Xtra gel 450 cc implants bilaterally)  Patient reports she is feeling very well.  Using occasional Tylenol and ibuprofen for pain.  She is excited to have the honeycomb dressing removed so she can see what is underneath.  Honeycomb dressing removed today.  Steri-Strips in place.  Incisions are C/D/I.  No signs of infection, redness, drainage, seroma/hematoma.  Review of Systems  Constitutional: Negative for chills and fever.  Respiratory: Negative for shortness of breath.   Cardiovascular: Negative for chest pain and leg swelling.  Gastrointestinal: Negative for constipation, diarrhea, nausea and vomiting.  Skin: Negative for color change, pallor and rash.     Objective:   Vital Signs BP 114/79 (BP Location: Left Arm, Patient Position: Sitting, Cuff Size: Large)   Pulse 76   Temp 97.9 F (36.6 C) (Oral)   LMP  (LMP Unknown)   SpO2 97%  Vital Signs and Nursing Note Reviewed  Physical Exam Constitutional:      General: She is not in acute distress.    Appearance: Normal appearance. She is normal weight. She is not ill-appearing.  HENT:     Head: Normocephalic and atraumatic.  Eyes:     Extraocular Movements: Extraocular movements intact.  Cardiovascular:     Rate and Rhythm: Normal rate.  Pulmonary:     Effort: Pulmonary effort is normal.  Musculoskeletal:        General: Normal range of motion.     Cervical back: Normal range of motion.  Skin:    General: Skin is warm and dry.     Coloration: Skin is not pale.     Findings: No erythema or rash.    Neurological:     Mental Status: She is alert and oriented to person, place, and time.     Gait: Gait is intact.  Psychiatric:        Mood and Affect: Mood and affect normal.        Behavior: Behavior normal.        Thought Content: Thought content normal.        Cognition and Memory: Memory normal.        Judgment: Judgment normal.       Assessment/Plan:     ICD-10-CM   1. S/P breast reconstruction, bilateral  Z98.890     Ms. Megan [name] is doing very well.  Incisions are healing nicely, C/D/I.  No signs of infection, redness, drainage, seroma/hematoma.  Continue to wear compression garment 24/7 until 6 weeks postop.  She may do light massage pressing inward and downward.  Continue to avoid heavy lifting.  Follow-up scheduled for 1.5 weeks.  Call office with any questions/concerns.  The [place] was signed into law in 2016 which includes the topic of electronic health records.  This provides immediate access to information in MyChart.  This includes consultation notes, operative notes, office notes, lab results and pathology reports.  If you have any questions about what you read please let us know at your next visit or call us at the office.  We are right here  with you.   Threasa Heads, PA-C 07/19/2020, 11:44 AM

## 2020-07-19 ENCOUNTER — Encounter: Payer: Self-pay | Admitting: Plastic Surgery

## 2020-07-19 ENCOUNTER — Other Ambulatory Visit: Payer: Self-pay

## 2020-07-19 ENCOUNTER — Ambulatory Visit (INDEPENDENT_AMBULATORY_CARE_PROVIDER_SITE_OTHER): Payer: 59 | Admitting: Plastic Surgery

## 2020-07-19 VITALS — BP 114/79 | HR 76 | Temp 97.9°F

## 2020-07-19 DIAGNOSIS — Z9889 Other specified postprocedural states: Secondary | ICD-10-CM

## 2020-07-23 ENCOUNTER — Ambulatory Visit (HOSPITAL_COMMUNITY): Payer: 59

## 2020-07-24 ENCOUNTER — Other Ambulatory Visit: Payer: Self-pay

## 2020-07-24 ENCOUNTER — Ambulatory Visit (HOSPITAL_COMMUNITY)
Admission: RE | Admit: 2020-07-24 | Discharge: 2020-07-24 | Disposition: A | Discharge status: Home / Self Care | Payer: 59 | Source: Ambulatory Visit | Attending: Gastroenterology | Admitting: Gastroenterology

## 2020-07-24 DIAGNOSIS — Z853 Personal history of malignant neoplasm of breast: Secondary | ICD-10-CM | POA: Diagnosis not present

## 2020-07-24 DIAGNOSIS — Z9049 Acquired absence of other specified parts of digestive tract: Secondary | ICD-10-CM | POA: Diagnosis not present

## 2020-07-24 DIAGNOSIS — K769 Liver disease, unspecified: Secondary | ICD-10-CM | POA: Insufficient documentation

## 2020-07-24 DIAGNOSIS — K7689 Other specified diseases of liver: Secondary | ICD-10-CM | POA: Diagnosis not present

## 2020-07-24 MED ORDER — GADOBUTROL 1 MMOL/ML IV SOLN
7.0000 mL | Freq: Once | INTRAVENOUS | Status: AC | PRN
  Administered 2020-07-24: 7 mL via INTRAVENOUS

## 2020-07-25 ENCOUNTER — Other Ambulatory Visit: Payer: Self-pay

## 2020-07-25 DIAGNOSIS — R748 Abnormal levels of other serum enzymes: Secondary | ICD-10-CM

## 2020-07-30 ENCOUNTER — Other Ambulatory Visit: Payer: Self-pay

## 2020-07-30 ENCOUNTER — Other Ambulatory Visit (INDEPENDENT_AMBULATORY_CARE_PROVIDER_SITE_OTHER): Payer: 59

## 2020-07-30 ENCOUNTER — Encounter: Payer: Self-pay | Admitting: Plastic Surgery

## 2020-07-30 ENCOUNTER — Ambulatory Visit (INDEPENDENT_AMBULATORY_CARE_PROVIDER_SITE_OTHER): Payer: 59 | Admitting: Plastic Surgery

## 2020-07-30 VITALS — BP 118/66 | HR 84 | Temp 98.3°F

## 2020-07-30 DIAGNOSIS — R748 Abnormal levels of other serum enzymes: Secondary | ICD-10-CM | POA: Diagnosis not present

## 2020-07-30 DIAGNOSIS — Z9889 Other specified postprocedural states: Secondary | ICD-10-CM

## 2020-07-30 LAB — HEPATIC FUNCTION PANEL
ALT: 18 U/L (ref 0–35)
AST: 15 U/L (ref 0–37)
Albumin: 4.5 g/dL (ref 3.5–5.2)
Alkaline Phosphatase: 110 U/L (ref 39–117)
Bilirubin, Direct: 0.1 mg/dL (ref 0.0–0.3)
Total Bilirubin: 0.4 mg/dL (ref 0.2–1.2)
Total Protein: 7.2 g/dL (ref 6.0–8.3)

## 2020-07-30 NOTE — Progress Notes (Signed)
Patient ID: Megan [name], female    DOB: 04-25-1971, 49 y.o.   MRN: [id number]   Chief Complaint  Patient presents with  . Post-op Follow-up    The patient is a 49 year old female here for follow-up on her bilateral implant placement after reconstruction.  She has Mentor memory smooth high-profile extra gel 450 cc implants and bilaterally.  Her incisions are all healing nicely.  There is no sign of infection.  They seem to be settling nicely.  She has not had any pain.  Her bowels are moving and she is resuming her regular activity a little bit more each day.  Her Steri-Strips are off.  She is in a sports bra.  There is still a little bit of fullness on the inferior medial aspect of both breasts.  It seems to respond with massage.   Review of Systems  Constitutional: Negative.   HENT: Negative.   Eyes: Negative.   Respiratory: Negative.   Cardiovascular: Negative.   Gastrointestinal: Negative.   Genitourinary: Negative.   Musculoskeletal: Negative.   Hematological: Negative.   Psychiatric/Behavioral: Negative.     Past Medical History:  Diagnosis Date  . Allergic rhinitis   . Allergy   . Breast cancer (HCC)    BIL BREAST  . Family history of breast cancer   . Family history of colon cancer   . Family history of prostate cancer   . GERD (gastroesophageal reflux disease)   . MRSA infection 2017   left buttock  . PONV (postoperative nausea and vomiting)   . SVD (spontaneous vaginal delivery)    x 1  . TB lung, latent    +  quantiferon, neg PPD - s/p treatment - completed    neg cxr    Past Surgical History:  Procedure Laterality Date  . BILIARY DILATION  05/30/2020   Procedure: BILIARY DILATION;  Surgeon: [name], MD;  Location: [facility];  Service: Endoscopy;;  . BREAST BIOPSY Right 08/24/2012   benign  . BREAST RECONSTRUCTION WITH PLACEMENT OF TISSUE EXPANDER AND FLEX HD (ACELLULAR HYDRATED DERMIS) Bilateral 04/08/2020   Procedure: BILATERAL BREAST  RECONSTRUCTION WITH PLACEMENT OF TISSUE EXPANDER AND FLEX HD (ACELLULAR HYDRATED DERMIS);  Surgeon: Wallace Going, DO;  Location: [facility];  Service: Plastics;  Laterality: Bilateral;  . CESAREAN SECTION     x 1  . CHOLECYSTECTOMY N/A 06/14/2020   Procedure: LAPAROSCOPIC CHOLECYSTECTOMY WITH ATTEMPTED INTRAOPERATIVE CHOLANGIOGRAM;  Surgeon: Jovita Kussmaul, MD;  Location: [place];  Service: General;  Laterality: N/A;  . DILATION AND CURETTAGE OF UTERUS     x 2  . ENDOSCOPIC RETROGRADE CHOLANGIOPANCREATOGRAPHY (ERCP) WITH PROPOFOL N/A 05/30/2020   Procedure: ENDOSCOPIC RETROGRADE CHOLANGIOPANCREATOGRAPHY (ERCP) WITH PROPOFOL;  Surgeon: [name], MD;  Location: [facility];  Service: Endoscopy;  Laterality: N/A;  . NIPPLE SPARING MASTECTOMY WITH SENTINEL LYMPH NODE BIOPSY Bilateral 04/08/2020   Procedure: RIGHT NIPPLE SPARING MASTECTOMY WITH RIGHT SENTINEL LYMPH NODE BIOPSY, LEFT PROPHYLACTIC MASTECTOMY;  Surgeon: Jovita Kussmaul, MD;  Location: [facility];  Service: General;  Laterality: Bilateral;  . REMOVAL OF BILATERAL TISSUE EXPANDERS WITH PLACEMENT OF BILATERAL BREAST IMPLANTS Bilateral 07/10/2020   Procedure: REMOVAL OF BILATERAL TISSUE EXPANDERS WITH PLACEMENT OF BILATERAL BREAST IMPLANTS;  Surgeon: Wallace Going, DO;  Location: [place];  Service: Plastics;  Laterality: Bilateral;  . REMOVAL OF STONES  05/30/2020   Procedure: REMOVAL OF STONES;  Surgeon: [name], MD;  Location: [facility];  Service: Endoscopy;;  . SPHINCTEROTOMY  05/30/2020   Procedure: Joan Mayans;  Surgeon: [name], MD;  Location: [facility];  Service: Endoscopy;;  . WISDOM TOOTH EXTRACTION        Current Outpatient Medications:  .  cetirizine (ZYRTEC) 10 MG tablet, Take 10 mg by mouth daily., Disp: , Rfl:  .  cyclobenzaprine (FLEXERIL) 5 MG tablet, Take 5 mg by mouth 3 (three) times daily as needed for muscle spasms. , Disp: , Rfl:  .   esomeprazole (NEXIUM) 40 MG capsule, Take 1 capsule (40 mg total) by mouth daily. (Patient taking differently: Take 20 mg by mouth daily. ), Disp: 14 capsule, Rfl: 0 .  fluticasone (FLONASE) 50 MCG/ACT nasal spray, Place 2 sprays into both nostrils daily. (Patient taking differently: Place 1 spray into both nostrils daily. ), Disp: 16 g, Rfl: 6 .  levonorgestrel (MIRENA) 20 MCG/24HR IUD, 1 each by Intrauterine route once., Disp: , Rfl:    Objective:   Vitals:   07/30/20 0944  BP: 118/66  Pulse: 84  Temp: 98.3 F (36.8 C)  SpO2: 98%    Physical Exam Vitals and nursing note reviewed.  Constitutional:      Appearance: Normal appearance.  HENT:     Head: Normocephalic and atraumatic.  Cardiovascular:     Rate and Rhythm: Normal rate.     Pulses: Normal pulses.  Pulmonary:     Effort: Pulmonary effort is normal.  Neurological:     General: No focal deficit present.     Mental Status: She is alert and oriented to person, place, and time.  Psychiatric:        Mood and Affect: Mood normal.        Behavior: Behavior normal.     Assessment & Plan:  S/P breast reconstruction, bilateral  We discussed the possibility of fat grafting in the future.  She has plenty of time to see how things develop.  Continue with sports bra.  Continue with massaging.  Pictures were obtained of the patient and placed in the chart with the patient's or guardian's permission.  [place], DO

## 2020-07-31 LAB — AFP TUMOR MARKER: AFP-Tumor Marker: 1.4 ng/mL

## 2020-08-05 ENCOUNTER — Encounter (HOSPITAL_BASED_OUTPATIENT_CLINIC_OR_DEPARTMENT_OTHER): Payer: Self-pay | Admitting: Plastic Surgery

## 2020-08-30 DIAGNOSIS — Z9012 Acquired absence of left breast and nipple: Secondary | ICD-10-CM | POA: Diagnosis not present

## 2020-08-30 DIAGNOSIS — C50911 Malignant neoplasm of unspecified site of right female breast: Secondary | ICD-10-CM | POA: Diagnosis not present

## 2020-09-16 ENCOUNTER — Encounter: Payer: Self-pay | Admitting: Gastroenterology

## 2020-09-16 ENCOUNTER — Ambulatory Visit (INDEPENDENT_AMBULATORY_CARE_PROVIDER_SITE_OTHER): Payer: 59 | Admitting: Gastroenterology

## 2020-09-16 VITALS — BP 104/80 | HR 84 | Ht 65.5 in | Wt 175.0 lb

## 2020-09-16 DIAGNOSIS — R932 Abnormal findings on diagnostic imaging of liver and biliary tract: Secondary | ICD-10-CM | POA: Diagnosis not present

## 2020-09-16 DIAGNOSIS — R160 Hepatomegaly, not elsewhere classified: Secondary | ICD-10-CM | POA: Diagnosis not present

## 2020-09-16 DIAGNOSIS — K769 Liver disease, unspecified: Secondary | ICD-10-CM | POA: Diagnosis not present

## 2020-09-16 NOTE — Progress Notes (Signed)
Referring Provider: Isaac Bliss, [facility]* Primary Care Physician:  Isaac Bliss, Rayford Halsted, MD  Chief complaint: Abnormal MRI   IMPRESSION:  Abnormal liver imaging    - Multiple small hepatic masses on contrasted CT 05/23/2020     - MRI 07/24/20: 3.1 cm right hepatic lobe mass Lap chole for symptomatic cholelithiasis s/p choledocholithiasis Abnormal liver enzymes, normalized following treatment of CBD stones Mild chronic gastritis without H pylori on EGD 12/01/18 Normal screening colonoscopy 12/01/18    - two small "polyps" were lymphoid aggregates  3.1cm liver lesion in the posterior right hepatic lobe incidentally found on imaging. No history of chronic liver disease and no risk factors for chronic liver disease. Liver enzymes normal. AFP normal. Normal colonoscopy 2019. She was seen at [facility] earlier today. We are awaiting their interpretation of her MRI. I anticipate that she may need follow-up MRI with [facility] if her imaging remains indeterminate.  No ongoing GI symptoms follow-up ERCP and subsequent lap chole. I asked her to reach out with any new GI symptoms.       PLAN: Follow-up PRN   HPI: Megan [name] is a 49 y.o. female who returns in follow-up.   She was seen  10/14/2018 for reflux controlled on PPI therapy, generalized abdominal pain, and loose stools.  EGD and colonoscopy were performed 12/01/2018. EGD was normal. Gastric biopsies showed mild chronic gastritis without H. pylori or intestinal metaplasia. Duodenal biopsies were normal. Three small sessile polyps in the descending colon and transverse colon removed but on pathology these were found to be lymphoid aggregates. Random colon biopsies were normal. Symptoms resolved without intervention.   Evaluated 05/27/20 for symptomatic choledocholithiasis presenting with epigastric abdominal pain/back pain, nausea, vomiting, acholic stools, pruritus, and abnormal liver enzymes.   ERCP 05/30/20 with biliary  sphincterotomy, sphincteroplasty and balloon extraction. Liver enzymes returned to normal on repeat evaluation in August 2021.   Had lap cholecystecomy in 05/2020 with path showing chronic cholecystitis  CT of the abdomen and pelvis with contrast 05/23/2020 was ordered by Dr. Isaac Bliss and showed a distended gallbladder with mild diffuse wall thickening suspicious for acute cholecystitis, mild diffuse biliary ductal dilatation with the CBD measuring 25mm, and small hepatic masses (posterior right lobe measuring 3cm, lateral left lobe measuring 1.3 cm).  No gallstones seen. MRI recommended for further follow-up. Patient declined MRI due to having breast tissue expanders and was told these could include metal.   MRI/MRCP was ultimately performed 07/24/20 after her expanders were removed. It showed a solitary 3.1 cm lesion in the posterior right hepatic lobe, which has nonspecific characteristics. Differential diagnosis includes metastasis, focal nodular hyperplasia, adenoma, and atypical hemangioma. Consider PET-CT scan, tissue sampling, or continued follow-up by MRI in 3-6 months.  Referred to [facility] and was seen there this morning. Awaiting records from her consultation visit.      Past Medical History:  Diagnosis Date  . Allergic rhinitis   . Allergy   . Breast cancer (HCC)    BIL BREAST  . Family history of breast cancer   . Family history of colon cancer   . Family history of prostate cancer   . GERD (gastroesophageal reflux disease)   . MRSA infection 2017   left buttock  . PONV (postoperative nausea and vomiting)   . SVD (spontaneous vaginal delivery)    x 1  . TB lung, latent    +  quantiferon, neg PPD - s/p treatment - completed    neg cxr  Past Surgical History:  Procedure Laterality Date  . BILIARY DILATION  05/30/2020   Procedure: BILIARY DILATION;  Surgeon: [name], MD;  Location: [facility];  Service: Endoscopy;;  . BREAST BIOPSY Right 08/24/2012    benign  . BREAST RECONSTRUCTION WITH PLACEMENT OF TISSUE EXPANDER AND FLEX HD (ACELLULAR HYDRATED DERMIS) Bilateral 04/08/2020   Procedure: BILATERAL BREAST RECONSTRUCTION WITH PLACEMENT OF TISSUE EXPANDER AND FLEX HD (ACELLULAR HYDRATED DERMIS);  Surgeon: Wallace Going, DO;  Location: [facility];  Service: Plastics;  Laterality: Bilateral;  . CESAREAN SECTION     x 1  . CHOLECYSTECTOMY N/A 06/14/2020   Procedure: LAPAROSCOPIC CHOLECYSTECTOMY WITH ATTEMPTED INTRAOPERATIVE CHOLANGIOGRAM;  Surgeon: Jovita Kussmaul, MD;  Location: [facility];  Service: General;  Laterality: N/A;  . DILATION AND CURETTAGE OF UTERUS     x 2  . ENDOSCOPIC RETROGRADE CHOLANGIOPANCREATOGRAPHY (ERCP) WITH PROPOFOL N/A 05/30/2020   Procedure: ENDOSCOPIC RETROGRADE CHOLANGIOPANCREATOGRAPHY (ERCP) WITH PROPOFOL;  Surgeon: [name], MD;  Location: [facility];  Service: Endoscopy;  Laterality: N/A;  . NIPPLE SPARING MASTECTOMY WITH SENTINEL LYMPH NODE BIOPSY Bilateral 04/08/2020   Procedure: RIGHT NIPPLE SPARING MASTECTOMY WITH RIGHT SENTINEL LYMPH NODE BIOPSY, LEFT PROPHYLACTIC MASTECTOMY;  Surgeon: Jovita Kussmaul, MD;  Location: [facility];  Service: General;  Laterality: Bilateral;  . REMOVAL OF BILATERAL TISSUE EXPANDERS WITH PLACEMENT OF BILATERAL BREAST IMPLANTS Bilateral 07/10/2020   Procedure: REMOVAL OF BILATERAL TISSUE EXPANDERS WITH PLACEMENT OF BILATERAL BREAST IMPLANTS;  Surgeon: Wallace Going, DO;  Location: [facility];  Service: Plastics;  Laterality: Bilateral;  . REMOVAL OF STONES  05/30/2020   Procedure: REMOVAL OF STONES;  Surgeon: [name], MD;  Location: [facility];  Service: Endoscopy;;  . Joan Mayans  05/30/2020   Procedure: Joan Mayans;  Surgeon: [name], MD;  Location: [facility];  Service: Endoscopy;;  . WISDOM TOOTH EXTRACTION      Current Outpatient Medications  Medication Sig Dispense Refill  . cetirizine (ZYRTEC) 10 MG  tablet Take 10 mg by mouth daily.    . cyclobenzaprine (FLEXERIL) 5 MG tablet Take 5 mg by mouth 3 (three) times daily as needed for muscle spasms.     Marland Kitchen esomeprazole (NEXIUM) 40 MG capsule Take 1 capsule (40 mg total) by mouth daily. (Patient taking differently: Take 20 mg by mouth daily. ) 14 capsule 0  . fluticasone (FLONASE) 50 MCG/ACT nasal spray Place 2 sprays into both nostrils daily. (Patient taking differently: Place 1 spray into both nostrils daily. ) 16 g 6  . levonorgestrel (MIRENA) 20 MCG/24HR IUD 1 each by Intrauterine route once.     No current facility-administered medications for this visit.    Allergies as of 09/16/2020  . (No Known Allergies)    Family History  Problem Relation Age of Onset  . Hypertension Mother   . Breast cancer Mother 82  . Hyperlipidemia Father   . Diabetes Father   . Crohn's disease Father   . Stroke Maternal Grandmother   . Diabetes Maternal Grandmother   . Prostate cancer Paternal Uncle   . Colon cancer Paternal Grandmother   . Breast cancer Other   . Rectal cancer Neg Hx   . Stomach cancer Neg Hx   . Esophageal cancer Neg Hx     Social History   Socioeconomic History  . Marital status: Married    Spouse name: Not on file  . Number of children: 2  . Years of education: Not on file  . Highest  education level: Not on file  Occupational History  . Occupation: Nurse Tourist information centre manager  Tobacco Use  . Smoking status: Never Smoker  . Smokeless tobacco: Never Used  Vaping Use  . Vaping Use: Never used  Substance and Sexual Activity  . Alcohol use: Not Currently    Comment: twice a year  . Drug use: No  . Sexual activity: Yes    Birth control/protection: I.U.D.    Comment: Mirena IUD-Inserted 2018  Other Topics Concern  . Not on file  Social History Narrative   Occupation: Therapist, sports - [employer]   Married - 2 kids (53 and 8 in 2016)   Regular exercise- yes   Eating a healthy diet   Spiritual: catholic   Social Determinants of  Health   Financial Resource Strain:   . Difficulty of Paying Living Expenses: Not on file  Food Insecurity:   . Worried About Charity fundraiser in the Last Year: Not on file  . Ran Out of Food in the Last Year: Not on file  Transportation Needs:   . Lack of Transportation (Medical): Not on file  . Lack of Transportation (Non-Medical): Not on file  Physical Activity:   . Days of Exercise per Week: Not on file  . Minutes of Exercise per Session: Not on file  Stress:   . Feeling of Stress : Not on file  Social Connections:   . Frequency of Communication with Friends and Family: Not on file  . Frequency of Social Gatherings with Friends and Family: Not on file  . Attends Religious Services: Not on file  . Active Member of Clubs or Organizations: Not on file  . Attends Archivist Meetings: Not on file  . Marital Status: Not on file  Intimate Partner Violence:   . Fear of Current or Ex-Partner: Not on file  . Emotionally Abused: Not on file  . Physically Abused: Not on file  . Sexually Abused: Not on file     Physical Exam: General:   Alert,  well-nourished, pleasant and cooperative in NAD Head:  Normocephalic and atraumatic. Eyes:  Sclera clear, no icterus.   Conjunctiva pink. Abdomen:  Soft, nontender, nondistended, normal bowel sounds, no rebound or guarding. No hepatosplenomegaly.   Extremities:  No clubbing or edema. Neurologic:  Alert and  oriented x4;  grossly nonfocal Skin:  Intact without significant lesions or rashes. Psych:  Alert and cooperative. Normal mood and affect.    Willliam Pettet L. Tarri Glenn, MD, MPH 09/16/2020, 1:33 PM

## 2020-09-16 NOTE — Patient Instructions (Addendum)
If you are age 47 or older, your body mass index should be between 23-30. Your Body mass index is 28.68 kg/m. If this is out of the aforementioned range listed, please consider follow up with your Primary Care Provider.  If you are age 8 or younger, your body mass index should be between 19-25. Your Body mass index is 28.68 kg/m. If this is out of the aformentioned range listed, please consider follow up with your Primary Care Provider.     We will wait to get your results from the [facility] liver clinic's review of your MRI.  Please let me know if you have any questions or concerns before you hear from them.   Dr. Tarri Glenn

## 2020-10-04 DIAGNOSIS — D0511 Intraductal carcinoma in situ of right breast: Secondary | ICD-10-CM | POA: Diagnosis not present

## 2020-11-01 ENCOUNTER — Encounter: Payer: Self-pay | Admitting: Plastic Surgery

## 2020-11-01 ENCOUNTER — Ambulatory Visit: Payer: 59 | Admitting: Plastic Surgery

## 2020-11-01 ENCOUNTER — Other Ambulatory Visit: Payer: Self-pay

## 2020-11-01 ENCOUNTER — Ambulatory Visit (INDEPENDENT_AMBULATORY_CARE_PROVIDER_SITE_OTHER): Payer: 59 | Admitting: Plastic Surgery

## 2020-11-01 VITALS — BP 140/85 | HR 62 | Temp 97.9°F

## 2020-11-01 DIAGNOSIS — Z9889 Other specified postprocedural states: Secondary | ICD-10-CM | POA: Diagnosis not present

## 2020-11-01 NOTE — Progress Notes (Signed)
   Subjective:    Patient ID: Megan [name], female    DOB: 1971-11-24, 49 y.o.   MRN: [id number]  The patient is a 49 year old female here for follow-up after undergoing breast reconstruction.  Approximately 6 months ago she had bilateral mastectomies for right-sided breast cancer.  She went through expander placement followed by implants.  She has been Mentor memory smooth high profile extra gel 450 cc implants.  She was able to sustain her nipple areolas.  She has a little bit of loss of pigment.  This does not bother her.  The incisions are healing very nicely.  She notices a little bit of lateral movement when she lays down.  This appears normal.  No capsular contracture and no lumps or concerning bumps.  She has a little bit of loss of fullness in the upper lobes of the breast.   Review of Systems  Constitutional: Negative.  Negative for activity change and appetite change.  Eyes: Negative.   Respiratory: Negative.   Cardiovascular: Negative.   Gastrointestinal: Negative.   Endocrine: Negative.   Genitourinary: Negative.   Musculoskeletal: Negative.   Hematological: Negative.   Psychiatric/Behavioral: Negative.        Objective:   Physical Exam Vitals and nursing note reviewed.  Constitutional:      Appearance: Normal appearance.  HENT:     Head: Normocephalic and atraumatic.  Cardiovascular:     Rate and Rhythm: Normal rate.     Pulses: Normal pulses.  Pulmonary:     Effort: Pulmonary effort is normal.  Abdominal:     General: Abdomen is flat.  Neurological:     General: No focal deficit present.     Mental Status: She is alert and oriented to person, place, and time.  Psychiatric:        Mood and Affect: Mood normal.        Behavior: Behavior normal.        Assessment & Plan:     ICD-10-CM   1. S/P breast reconstruction, bilateral  Z98.890     The patient is going to think things over and we will plan on a 1 year follow-up.  Lipo filling of the upper pole of  the breast is possible at any time.  She could also have nipple areola tattooing if the loss of pigment bothers her at all.  Overall she is doing really well. Pictures were obtained of the patient and placed in the chart with the patient's or guardian's permission.

## 2021-01-13 DIAGNOSIS — Z20822 Contact with and (suspected) exposure to covid-19: Secondary | ICD-10-CM | POA: Diagnosis not present

## 2021-03-19 ENCOUNTER — Encounter: Payer: 59 | Admitting: Internal Medicine

## 2021-03-21 DIAGNOSIS — R160 Hepatomegaly, not elsewhere classified: Secondary | ICD-10-CM | POA: Diagnosis not present

## 2021-03-26 ENCOUNTER — Other Ambulatory Visit: Payer: Self-pay

## 2021-03-26 ENCOUNTER — Ambulatory Visit (INDEPENDENT_AMBULATORY_CARE_PROVIDER_SITE_OTHER): Payer: 59 | Admitting: Internal Medicine

## 2021-03-26 ENCOUNTER — Encounter: Payer: Self-pay | Admitting: Internal Medicine

## 2021-03-26 VITALS — BP 120/88 | HR 73 | Temp 98.2°F | Ht 65.5 in | Wt 179.1 lb

## 2021-03-26 DIAGNOSIS — K831 Obstruction of bile duct: Secondary | ICD-10-CM | POA: Diagnosis not present

## 2021-03-26 DIAGNOSIS — E559 Vitamin D deficiency, unspecified: Secondary | ICD-10-CM | POA: Diagnosis not present

## 2021-03-26 DIAGNOSIS — Z9889 Other specified postprocedural states: Secondary | ICD-10-CM

## 2021-03-26 DIAGNOSIS — C50411 Malignant neoplasm of upper-outer quadrant of right female breast: Secondary | ICD-10-CM

## 2021-03-26 DIAGNOSIS — K219 Gastro-esophageal reflux disease without esophagitis: Secondary | ICD-10-CM | POA: Diagnosis not present

## 2021-03-26 DIAGNOSIS — Z0000 Encounter for general adult medical examination without abnormal findings: Secondary | ICD-10-CM | POA: Diagnosis not present

## 2021-03-26 DIAGNOSIS — Z170 Estrogen receptor positive status [ER+]: Secondary | ICD-10-CM

## 2021-03-26 DIAGNOSIS — E785 Hyperlipidemia, unspecified: Secondary | ICD-10-CM

## 2021-03-26 LAB — CBC WITH DIFFERENTIAL/PLATELET
Basophils Absolute: 0.1 10*3/uL (ref 0.0–0.1)
Basophils Relative: 0.9 % (ref 0.0–3.0)
Eosinophils Absolute: 0.1 10*3/uL (ref 0.0–0.7)
Eosinophils Relative: 1.6 % (ref 0.0–5.0)
HCT: 42.8 % (ref 36.0–46.0)
Hemoglobin: 14.6 g/dL (ref 12.0–15.0)
Lymphocytes Relative: 30.2 % (ref 12.0–46.0)
Lymphs Abs: 2.4 10*3/uL (ref 0.7–4.0)
MCHC: 34.1 g/dL (ref 30.0–36.0)
MCV: 89.2 fl (ref 78.0–100.0)
Monocytes Absolute: 0.6 10*3/uL (ref 0.1–1.0)
Monocytes Relative: 8 % (ref 3.0–12.0)
Neutro Abs: 4.7 10*3/uL (ref 1.4–7.7)
Neutrophils Relative %: 59.3 % (ref 43.0–77.0)
Platelets: 276 10*3/uL (ref 150.0–400.0)
RBC: 4.8 Mil/uL (ref 3.87–5.11)
RDW: 13.4 % (ref 11.5–15.5)
WBC: 7.9 10*3/uL (ref 4.0–10.5)

## 2021-03-26 LAB — COMPREHENSIVE METABOLIC PANEL
ALT: 19 U/L (ref 0–35)
AST: 17 U/L (ref 0–37)
Albumin: 4.6 g/dL (ref 3.5–5.2)
Alkaline Phosphatase: 88 U/L (ref 39–117)
BUN: 13 mg/dL (ref 6–23)
CO2: 25 mEq/L (ref 19–32)
Calcium: 9.6 mg/dL (ref 8.4–10.5)
Chloride: 104 mEq/L (ref 96–112)
Creatinine, Ser: 0.69 mg/dL (ref 0.40–1.20)
GFR: 103.26 mL/min (ref >60.00)
Glucose, Bld: 105 mg/dL — ABNORMAL HIGH (ref 70–99)
Potassium: 4.1 mEq/L (ref 3.5–5.1)
Sodium: 138 mEq/L (ref 135–145)
Total Bilirubin: 1 mg/dL (ref 0.2–1.2)
Total Protein: 7.3 g/dL (ref 6.0–8.3)

## 2021-03-26 LAB — LIPID PANEL
Cholesterol: 199 mg/dL (ref 0–200)
HDL: 40.1 mg/dL (ref >39.00)
LDL Cholesterol: 121 mg/dL — ABNORMAL HIGH (ref 0–99)
NonHDL: 158.47
Total CHOL/HDL Ratio: 5
Triglycerides: 188 mg/dL — ABNORMAL HIGH (ref 0.0–149.0)
VLDL: 37.6 mg/dL (ref 0.0–40.0)

## 2021-03-26 LAB — VITAMIN D 25 HYDROXY (VIT D DEFICIENCY, FRACTURES): VITD: 19.94 ng/mL — ABNORMAL LOW (ref 30.00–100.00)

## 2021-03-26 LAB — HEMOGLOBIN A1C: Hgb A1c MFr Bld: 5.9 % (ref 4.6–6.5)

## 2021-03-26 LAB — TSH: TSH: 2.95 u[IU]/mL (ref 0.35–4.50)

## 2021-03-26 LAB — VITAMIN B12: Vitamin B-12: 179 pg/mL — ABNORMAL LOW (ref 211–911)

## 2021-03-26 NOTE — Progress Notes (Signed)
Established Patient Office Visit     This visit occurred during the SARS-CoV-2 public health emergency.  Safety protocols were in place, including screening questions prior to the visit, additional usage of staff PPE, and extensive cleaning of exam room while observing appropriate contact time as indicated for disinfecting solutions.    CC/Reason for Visit: Annual preventive exam  HPI: Megan [name] is a 50 y.o. female who is coming in today for the above mentioned reasons. Past Medical History is significant for: Ductal carcinoma in situ of the right breast diagnosed in 2021 status post bilateral mastectomy by choice.  She also had cholelithiasis and choledocholithiasis with obstructive jaundice over the summer 2021 that required ERCP and cholecystectomy.  She has had a right hepatic mass and has been followed through [facility] for this.  They believe it might be a hemangioma.  She has been doing well and has no acute complaints.  She has routine eye and dental care.  She exercises on her Peloton bike 3 to 4 days a week.  She has had all of her COVID vaccines.  She had a colonoscopy in 2019.  She no longer does mammograms.  She will schedule follow-up with her GYN which is due for later this summer.   Past Medical/Surgical History: Past Medical History:  Diagnosis Date  . Allergic rhinitis   . Allergy   . Breast cancer (HCC)    BIL BREAST  . Family history of breast cancer   . Family history of colon cancer   . Family history of prostate cancer   . GERD (gastroesophageal reflux disease)   . MRSA infection 2017   left buttock  . PONV (postoperative nausea and vomiting)   . SVD (spontaneous vaginal delivery)    x 1  . TB lung, latent    +  quantiferon, neg PPD - s/p treatment - completed    neg cxr    Past Surgical History:  Procedure Laterality Date  . BILIARY DILATION  05/30/2020   Procedure: BILIARY DILATION;  Surgeon: [name], MD;  Location: [facility];  Service:  Endoscopy;;  . BREAST BIOPSY Right 08/24/2012   benign  . BREAST RECONSTRUCTION WITH PLACEMENT OF TISSUE EXPANDER AND FLEX HD (ACELLULAR HYDRATED DERMIS) Bilateral 04/08/2020   Procedure: BILATERAL BREAST RECONSTRUCTION WITH PLACEMENT OF TISSUE EXPANDER AND FLEX HD (ACELLULAR HYDRATED DERMIS);  Surgeon: Wallace Going, DO;  Location: [place];  Service: Plastics;  Laterality: Bilateral;  . CESAREAN SECTION     x 1  . CHOLECYSTECTOMY N/A 06/14/2020   Procedure: LAPAROSCOPIC CHOLECYSTECTOMY WITH ATTEMPTED INTRAOPERATIVE CHOLANGIOGRAM;  Surgeon: Jovita Kussmaul, MD;  Location: [place];  Service: General;  Laterality: N/A;  . DILATION AND CURETTAGE OF UTERUS     x 2  . ENDOSCOPIC RETROGRADE CHOLANGIOPANCREATOGRAPHY (ERCP) WITH PROPOFOL N/A 05/30/2020   Procedure: ENDOSCOPIC RETROGRADE CHOLANGIOPANCREATOGRAPHY (ERCP) WITH PROPOFOL;  Surgeon: [name], MD;  Location: [facility];  Service: Endoscopy;  Laterality: N/A;  . NIPPLE SPARING MASTECTOMY WITH SENTINEL LYMPH NODE BIOPSY Bilateral 04/08/2020   Procedure: RIGHT NIPPLE SPARING MASTECTOMY WITH RIGHT SENTINEL LYMPH NODE BIOPSY, LEFT PROPHYLACTIC MASTECTOMY;  Surgeon: Jovita Kussmaul, MD;  Location: [place];  Service: General;  Laterality: Bilateral;  . REMOVAL OF BILATERAL TISSUE EXPANDERS WITH PLACEMENT OF BILATERAL BREAST IMPLANTS Bilateral 07/10/2020   Procedure: REMOVAL OF BILATERAL TISSUE EXPANDERS WITH PLACEMENT OF BILATERAL BREAST IMPLANTS;  Surgeon: Wallace Going, DO;  Location: [place];  Service: Clinical cytogeneticist;  Laterality: Bilateral;  . REMOVAL OF STONES  05/30/2020   Procedure: REMOVAL OF STONES;  Surgeon: [name], MD;  Location: [facility];  Service: Endoscopy;;  . Joan Mayans  05/30/2020   Procedure: Joan Mayans;  Surgeon: [name], MD;  Location: [facility];  Service: Endoscopy;;  . WISDOM TOOTH EXTRACTION      Social History:  reports that she has never  smoked. She has never used smokeless tobacco. She reports previous alcohol use. She reports that she does not use drugs.  Allergies: No Known Allergies  Family History:  Family History  Problem Relation Age of Onset  . Hypertension Mother   . Breast cancer Mother 71  . Hyperlipidemia Father   . Diabetes Father   . Crohn's disease Father   . Stroke Maternal Grandmother   . Diabetes Maternal Grandmother   . Prostate cancer Paternal Uncle   . Colon cancer Paternal Grandmother   . Breast cancer Other   . Rectal cancer Neg Hx   . Stomach cancer Neg Hx   . Esophageal cancer Neg Hx      Current Outpatient Medications:  .  cetirizine (ZYRTEC) 10 MG tablet, Take 10 mg by mouth daily., Disp: , Rfl:  .  esomeprazole (NEXIUM) 40 MG capsule, Take 1 capsule (40 mg total) by mouth daily. (Patient taking differently: Take 20 mg by mouth daily. ), Disp: 14 capsule, Rfl: 0 .  fluticasone (FLONASE) 50 MCG/ACT nasal spray, Place 2 sprays into both nostrils daily. (Patient taking differently: Place 1 spray into both nostrils daily. ), Disp: 16 g, Rfl: 6 .  levonorgestrel (MIRENA) 20 MCG/24HR IUD, 1 each by Intrauterine route once., Disp: , Rfl:   Review of Systems:  Constitutional: Denies fever, chills, diaphoresis, appetite change and fatigue.  HEENT: Denies photophobia, eye pain, redness, hearing loss, ear pain, congestion, sore throat, rhinorrhea, sneezing, mouth sores, trouble swallowing, neck pain, neck stiffness and tinnitus.   Respiratory: Denies SOB, DOE, cough, chest tightness,  and wheezing.   Cardiovascular: Denies chest pain, palpitations and leg swelling.  Gastrointestinal: Denies nausea, vomiting, abdominal pain, diarrhea, constipation, blood in stool and abdominal distention.  Genitourinary: Denies dysuria, urgency, frequency, hematuria, flank pain and difficulty urinating.  Endocrine: Denies: hot or cold intolerance, sweats, changes in hair or nails, polyuria,  polydipsia. Musculoskeletal: Denies myalgias, back pain, joint swelling, arthralgias and gait problem.  Skin: Denies pallor, rash and wound.  Neurological: Denies dizziness, seizures, syncope, weakness, light-headedness, numbness and headaches.  Hematological: Denies adenopathy. Easy bruising, personal or family bleeding history  Psychiatric/Behavioral: Denies suicidal ideation, mood changes, confusion, nervousness, sleep disturbance and agitation    Physical Exam: Vitals:   03/26/21 0806  BP: 120/88  Pulse: 73  Temp: 98.2 F (36.8 C)  TempSrc: Oral  SpO2: 98%  Weight: 179 lb 1.6 oz (81.2 kg)  Height: 5' 5.5" (1.664 m)    Body mass index is 29.35 kg/m.   Constitutional: NAD, calm, comfortable Eyes: PERRL, lids and conjunctivae normal ENMT: Mucous membranes are moist. Posterior pharynx clear of any exudate or lesions. Normal dentition. Tympanic membrane is pearly white, no erythema or bulging. Neck: normal, supple, no masses, no thyromegaly Respiratory: clear to auscultation bilaterally, no wheezing, no crackles. Normal respiratory effort. No accessory muscle use.  Cardiovascular: Regular rate and rhythm, no murmurs / rubs / gallops. No extremity edema. 2+ pedal pulses. Abdomen: no tenderness, no masses palpated. No hepatosplenomegaly. Bowel sounds positive.  Musculoskeletal: no clubbing / cyanosis. No joint deformity upper and lower  extremities. Good ROM, no contractures. Normal muscle tone.  Skin: no rashes, lesions, ulcers. No induration Neurologic: CN 2-12 grossly intact. Sensation intact, DTR normal. Strength 5/5 in all 4.  Psychiatric: Normal judgment and insight. Alert and oriented x 3. Normal mood.    Impression and Plan:  Encounter for preventive health examination -She has routine eye and dental care. -All immunizations are up-to-date and age-appropriate. -Screening labs today. -Healthy lifestyle discussed in detail. -She had a colonoscopy in 2019 and is a  10-year follow-up. -She no longer does mammograms that she is status post bilateral mastectomies. -She will schedule appointment with her GYN this summer, she is due for Pap smear.  Gastroesophageal reflux disease without esophagitis -Well-controlled on as needed PPI therapy.  Hyperlipidemia, unspecified hyperlipidemia type  - Plan: Lipid panel -Not on medication.  Vitamin D deficiency  - Plan: VITAMIN D 25 Hydroxy (Vit-D Deficiency, Fractures)  Malignant neoplasm of upper-outer quadrant of right breast in female, estrogen receptor positive ([place]) S/P breast reconstruction, bilateral -Status post bilateral mastectomies with reconstruction.    Patient Instructions   -Nice seeing you today!!  -Lab work today; will notify you once results are available.  -Remember to schedule your GYN exam.  -Schedule follow up in 1 year or sooner as needed.   Preventive Care 52-62 Years Old, Female Preventive care refers to lifestyle choices and visits with your health care provider that can promote health and wellness. This includes:  A yearly physical exam. This is also called an annual wellness visit.  Regular dental and eye exams.  Immunizations.  Screening for certain conditions.  Healthy lifestyle choices, such as: ? Eating a healthy diet. ? Getting regular exercise. ? Not using drugs or products that contain nicotine and tobacco. ? Limiting alcohol use. What can I expect for my preventive care visit? Physical exam Your health care provider will check your:  Height and weight. These may be used to calculate your BMI (body mass index). BMI is a measurement that tells if you are at a healthy weight.  Heart rate and blood pressure.  Body temperature.  Skin for abnormal spots. Counseling Your health care provider may ask you questions about your:  Past medical problems.  Family's medical history.  Alcohol, tobacco, and drug use.  Emotional well-being.  Home life and  relationship well-being.  Sexual activity.  Diet, exercise, and sleep habits.  Work and work Statistician.  Access to firearms.  Method of birth control.  Menstrual cycle.  Pregnancy history. What immunizations do I need? Vaccines are usually given at various ages, according to a schedule. Your health care provider will recommend vaccines for you based on your age, medical history, and lifestyle or other factors, such as travel or where you work.   What tests do I need? Blood tests  Lipid and cholesterol levels. These may be checked every 5 years, or more often if you are over 73 years old.  Hepatitis C test.  Hepatitis B test. Screening  Lung cancer screening. You may have this screening every year starting at age 75 if you have a 30-pack-year history of smoking and currently smoke or have quit within the past 15 years.  Colorectal cancer screening. ? All adults should have this screening starting at age 60 and continuing until age 41. ? Your health care provider may recommend screening at age 44 if you are at increased risk. ? You will have tests every 1-10 years, depending on your results and the type of screening test.  Diabetes screening. ? This is done by checking your blood sugar (glucose) after you have not eaten for a while (fasting). ? You may have this done every 1-3 years.  Mammogram. ? This may be done every 1-2 years. ? Talk with your health care provider about when you should start having regular mammograms. This may depend on whether you have a family history of breast cancer.  BRCA-related cancer screening. This may be done if you have a family history of breast, ovarian, tubal, or peritoneal cancers.  Pelvic exam and Pap test. ? This may be done every 3 years starting at age 55. ? Starting at age 62, this may be done every 5 years if you have a Pap test in combination with an HPV test. Other tests  STD (sexually transmitted disease) testing, if you  are at risk.  Bone density scan. This is done to screen for osteoporosis. You may have this scan if you are at high risk for osteoporosis. Talk with your health care provider about your test results, treatment options, and if necessary, the need for more tests. Follow these instructions at home: Eating and drinking  Eat a diet that includes fresh fruits and vegetables, whole grains, lean protein, and low-fat dairy products.  Take vitamin and mineral supplements as recommended by your health care provider.  Do not drink alcohol if: ? Your health care provider tells you not to drink. ? You are pregnant, may be pregnant, or are planning to become pregnant.  If you drink alcohol: ? Limit how much you have to 0-1 drink a day. ? Be aware of how much alcohol is in your drink. In the [place] one drink equals one 12 oz bottle of beer (355 mL), one 5 oz glass of wine (148 mL), or one 1 oz glass of hard liquor (44 mL).   Lifestyle  Take daily care of your teeth and gums. Brush your teeth every morning and night with fluoride toothpaste. Floss one time each day.  Stay active. Exercise for at least 30 minutes 5 or more days each week.  Do not use any products that contain nicotine or tobacco, such as cigarettes, e-cigarettes, and chewing tobacco. If you need help quitting, ask your health care provider.  Do not use drugs.  If you are sexually active, practice safe sex. Use a condom or other form of protection to prevent STIs (sexually transmitted infections).  If you do not wish to become pregnant, use a form of birth control. If you plan to become pregnant, see your health care provider for a prepregnancy visit.  If told by your health care provider, take low-dose aspirin daily starting at age 71.  Find healthy ways to cope with stress, such as: ? Meditation, yoga, or listening to music. ? Journaling. ? Talking to a trusted person. ? Spending time with friends and family. Safety  Always  wear your seat belt while driving or riding in a vehicle.  Do not drive: ? If you have been drinking alcohol. Do not ride with someone who has been drinking. ? When you are tired or distracted. ? While texting.  Wear a helmet and other protective equipment during sports activities.  If you have firearms in your house, make sure you follow all gun safety procedures. What's next?  Visit your health care provider once a year for an annual wellness visit.  Ask your health care provider how often you should have your eyes and teeth checked.  Stay up to  date on all vaccines. This information is not intended to replace advice given to you by your health care provider. Make sure you discuss any questions you have with your health care provider. Document Revised: 09/03/2020 Document Reviewed: 08/11/2018 Elsevier Patient Education  2021 [place], [place] [facility]

## 2021-03-26 NOTE — Patient Instructions (Signed)
-Nice seeing you today!!  -Lab work today; will notify you once results are available.  -Remember to schedule your GYN exam.  -Schedule follow up in 1 year or sooner as needed.   Preventive Care 72-48 Years Old, Female Preventive care refers to lifestyle choices and visits with your health care provider that can promote health and wellness. This includes:  A yearly physical exam. This is also called an annual wellness visit.  Regular dental and eye exams.  Immunizations.  Screening for certain conditions.  Healthy lifestyle choices, such as: ? Eating a healthy diet. ? Getting regular exercise. ? Not using drugs or products that contain nicotine and tobacco. ? Limiting alcohol use. What can I expect for my preventive care visit? Physical exam Your health care provider will check your:  Height and weight. These may be used to calculate your BMI (body mass index). BMI is a measurement that tells if you are at a healthy weight.  Heart rate and blood pressure.  Body temperature.  Skin for abnormal spots. Counseling Your health care provider may ask you questions about your:  Past medical problems.  Family's medical history.  Alcohol, tobacco, and drug use.  Emotional well-being.  Home life and relationship well-being.  Sexual activity.  Diet, exercise, and sleep habits.  Work and work Statistician.  Access to firearms.  Method of birth control.  Menstrual cycle.  Pregnancy history. What immunizations do I need? Vaccines are usually given at various ages, according to a schedule. Your health care provider will recommend vaccines for you based on your age, medical history, and lifestyle or other factors, such as travel or where you work.   What tests do I need? Blood tests  Lipid and cholesterol levels. These may be checked every 5 years, or more often if you are over 22 years old.  Hepatitis C test.  Hepatitis B test. Screening  Lung cancer  screening. You may have this screening every year starting at age 48 if you have a 30-pack-year history of smoking and currently smoke or have quit within the past 15 years.  Colorectal cancer screening. ? All adults should have this screening starting at age 48 and continuing until age 24. ? Your health care provider may recommend screening at age 48 if you are at increased risk. ? You will have tests every 1-10 years, depending on your results and the type of screening test.  Diabetes screening. ? This is done by checking your blood sugar (glucose) after you have not eaten for a while (fasting). ? You may have this done every 1-3 years.  Mammogram. ? This may be done every 1-2 years. ? Talk with your health care provider about when you should start having regular mammograms. This may depend on whether you have a family history of breast cancer.  BRCA-related cancer screening. This may be done if you have a family history of breast, ovarian, tubal, or peritoneal cancers.  Pelvic exam and Pap test. ? This may be done every 3 years starting at age 48. ? Starting at age 48, this may be done every 5 years if you have a Pap test in combination with an HPV test. Other tests  STD (sexually transmitted disease) testing, if you are at risk.  Bone density scan. This is done to screen for osteoporosis. You may have this scan if you are at high risk for osteoporosis. Talk with your health care provider about your test results, treatment options, and if necessary, the need  for more tests. Follow these instructions at home: Eating and drinking  Eat a diet that includes fresh fruits and vegetables, whole grains, lean protein, and low-fat dairy products.  Take vitamin and mineral supplements as recommended by your health care provider.  Do not drink alcohol if: ? Your health care provider tells you not to drink. ? You are pregnant, may be pregnant, or are planning to become pregnant.  If you  drink alcohol: ? Limit how much you have to 0-1 drink a day. ? Be aware of how much alcohol is in your drink. In the [place] one drink equals one 12 oz bottle of beer (355 mL), one 5 oz glass of wine (148 mL), or one 1 oz glass of hard liquor (44 mL).   Lifestyle  Take daily care of your teeth and gums. Brush your teeth every morning and night with fluoride toothpaste. Floss one time each day.  Stay active. Exercise for at least 30 minutes 5 or more days each week.  Do not use any products that contain nicotine or tobacco, such as cigarettes, e-cigarettes, and chewing tobacco. If you need help quitting, ask your health care provider.  Do not use drugs.  If you are sexually active, practice safe sex. Use a condom or other form of protection to prevent STIs (sexually transmitted infections).  If you do not wish to become pregnant, use a form of birth control. If you plan to become pregnant, see your health care provider for a prepregnancy visit.  If told by your health care provider, take low-dose aspirin daily starting at age 48.  Find healthy ways to cope with stress, such as: ? Meditation, yoga, or listening to music. ? Journaling. ? Talking to a trusted person. ? Spending time with friends and family. Safety  Always wear your seat belt while driving or riding in a vehicle.  Do not drive: ? If you have been drinking alcohol. Do not ride with someone who has been drinking. ? When you are tired or distracted. ? While texting.  Wear a helmet and other protective equipment during sports activities.  If you have firearms in your house, make sure you follow all gun safety procedures. What's next?  Visit your health care provider once a year for an annual wellness visit.  Ask your health care provider how often you should have your eyes and teeth checked.  Stay up to date on all vaccines. This information is not intended to replace advice given to you by your health care provider.  Make sure you discuss any questions you have with your health care provider. Document Revised: 09/03/2020 Document Reviewed: 08/11/2018 Elsevier Patient Education  2021 [employer].

## 2021-03-27 ENCOUNTER — Other Ambulatory Visit: Payer: Self-pay | Admitting: Internal Medicine

## 2021-03-27 ENCOUNTER — Other Ambulatory Visit (HOSPITAL_COMMUNITY): Payer: Self-pay

## 2021-03-27 ENCOUNTER — Encounter: Payer: Self-pay | Admitting: Internal Medicine

## 2021-03-27 DIAGNOSIS — E538 Deficiency of other specified B group vitamins: Secondary | ICD-10-CM | POA: Insufficient documentation

## 2021-03-27 DIAGNOSIS — E559 Vitamin D deficiency, unspecified: Secondary | ICD-10-CM

## 2021-03-27 DIAGNOSIS — E785 Hyperlipidemia, unspecified: Secondary | ICD-10-CM

## 2021-03-27 MED ORDER — VITAMIN D (ERGOCALCIFEROL) 1.25 MG (50000 UNIT) PO CAPS
50000.0000 [IU] | ORAL_CAPSULE | ORAL | 0 refills | Status: AC
  Filled 2021-03-27: qty 12, 84d supply, fill #0

## 2021-03-27 MED ORDER — "BD LUER-LOK SYRINGE 25G X 1"" 3 ML MISC"
11 refills | Status: DC
  Filled 2021-03-27: qty 100, 90d supply, fill #0

## 2021-03-27 MED ORDER — CYANOCOBALAMIN 1000 MCG/ML IJ SOLN
INTRAMUSCULAR | 11 refills | Status: DC
  Filled 2021-03-27: qty 4, 28d supply, fill #0
  Filled 2021-05-19: qty 1, 30d supply, fill #1
  Filled 2021-06-26: qty 1, 30d supply, fill #2

## 2021-03-28 IMAGING — CT CT ABD-PELV W/ CM
1 of 3 series · 12 of 32 positions shown, 18 images · IV contrast (iopamidol)
Comparison: None.

CLINICAL DATA: Abdominal and epigastric pain for 5 weeks. Vomiting.
Neutropenia. Right breast carcinoma.

EXAM:
CT ABDOMEN AND PELVIS WITH CONTRAST
TECHNIQUE: Multidetector CT imaging of the abdomen and pelvis was performed
using the standard protocol following bolus administration of
intravenous contrast.
CONTRAST:  100mL [id number] IOPAMIDOL ([id number]-[id number]) INJECTION 61%

[Series 2: abd/pelvis w/cm · axial · 0.79mm/px · z∈[-429,-24]mm · 12 of 95 slices shown, 18 images]
[im 7/95  soft-tissue]
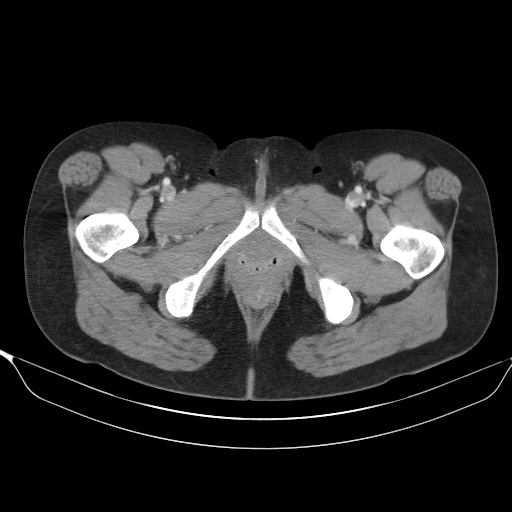
[im 7/95  bone]
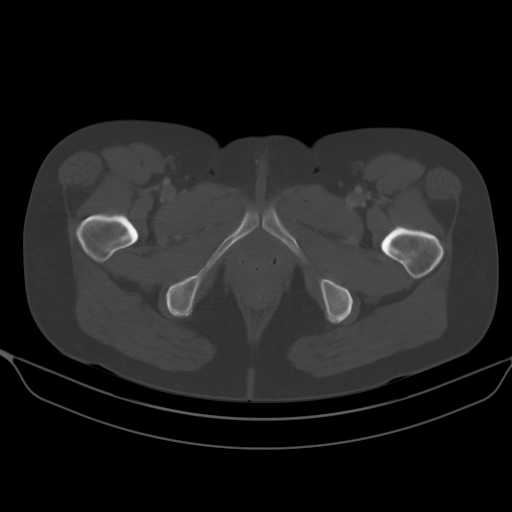
[im 14/95  soft-tissue]
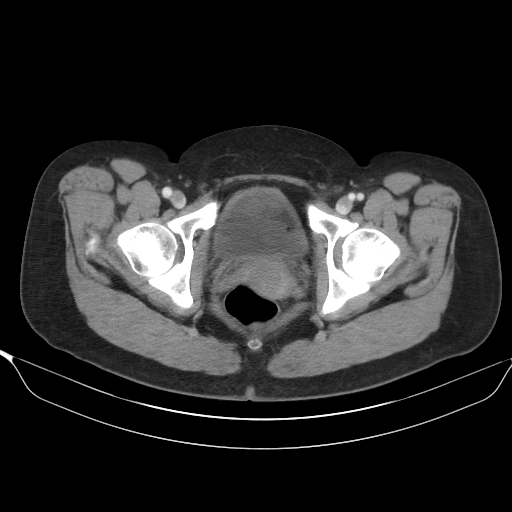
[im 21/95  soft-tissue]
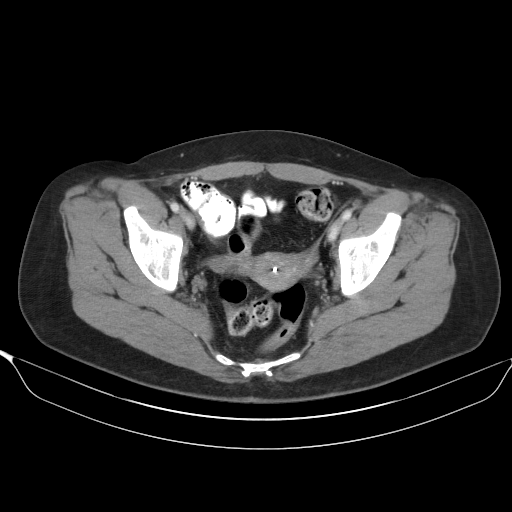
[im 27/95  soft-tissue]
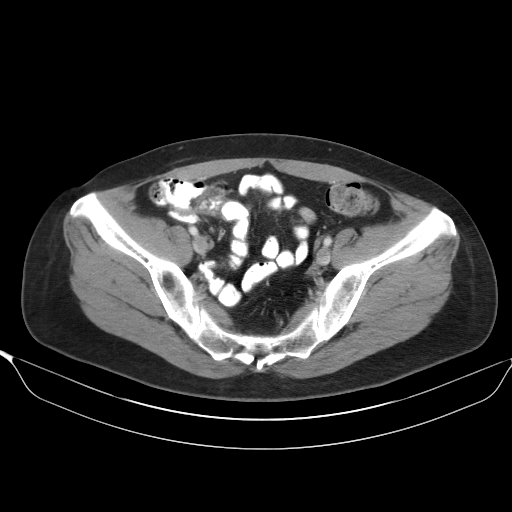
[im 34/95  soft-tissue]
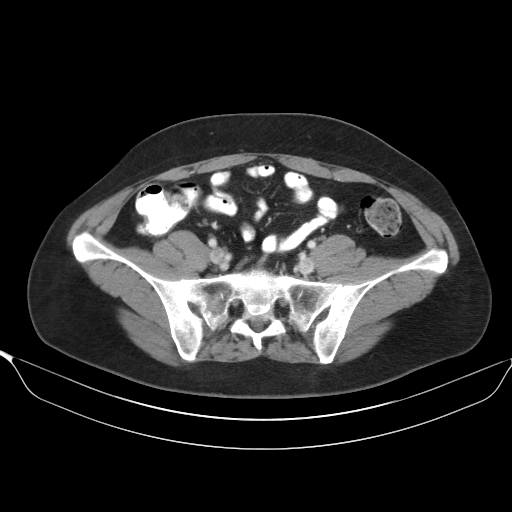
[im 41/95  soft-tissue]
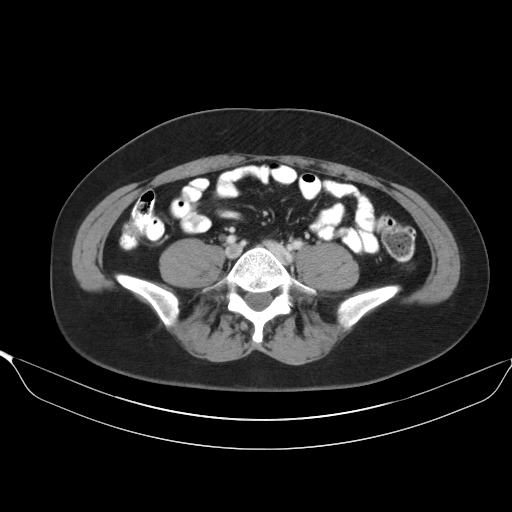
[im 54/95  soft-tissue]
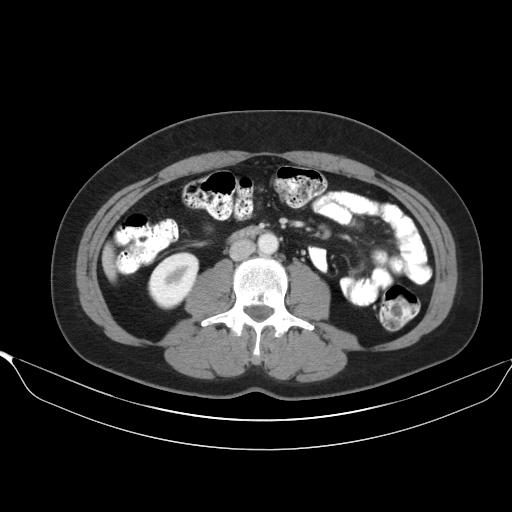
[im 61/95  soft-tissue]
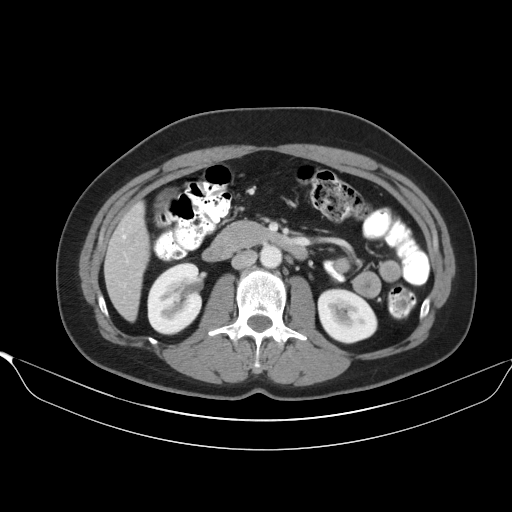
[im 68/95  soft-tissue]
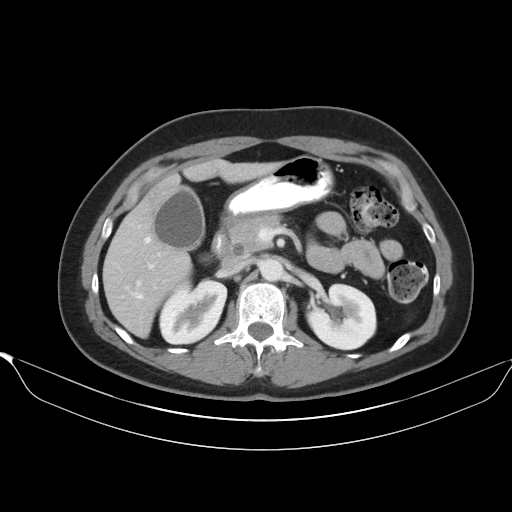
[im 68/95  lung]
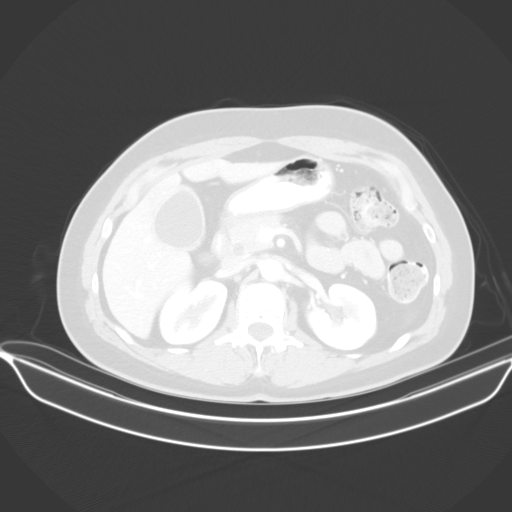
[im 68/95  bone]
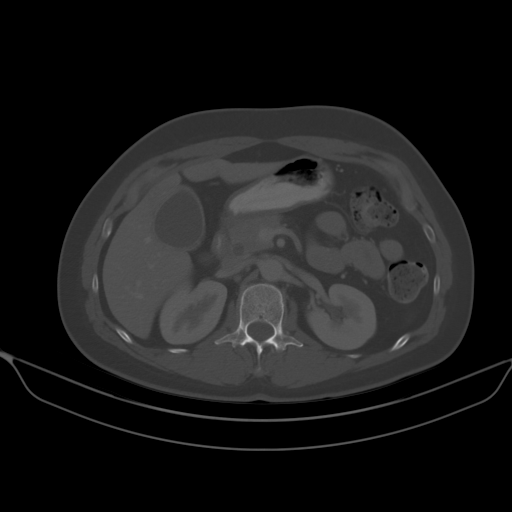
[im 74/95  soft-tissue]
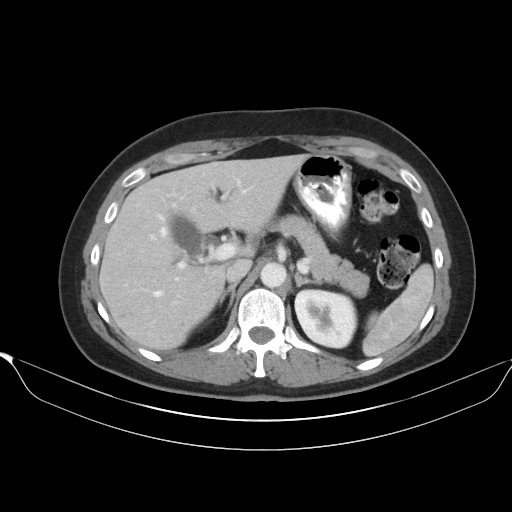
[im 74/95  lung]
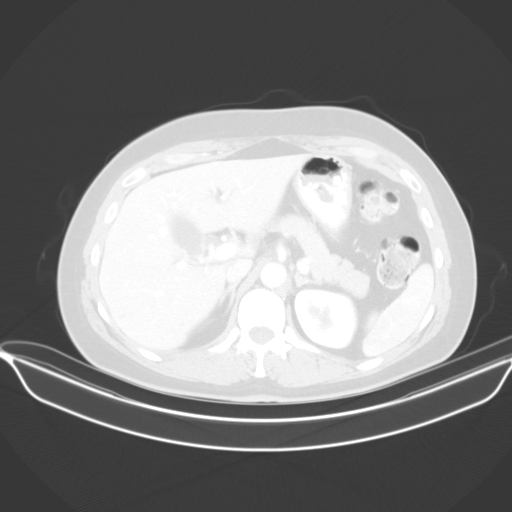
[im 81/95  soft-tissue]
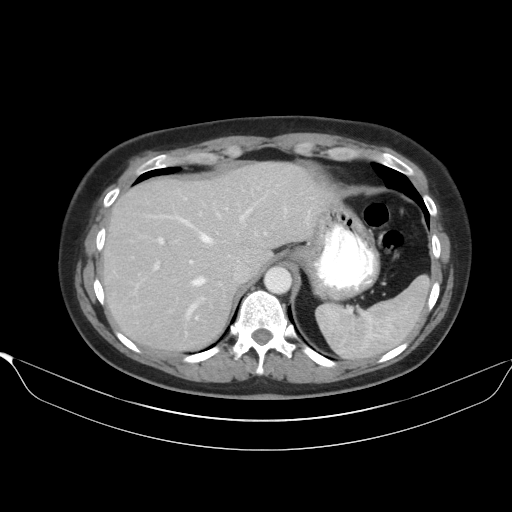
[im 81/95  lung]
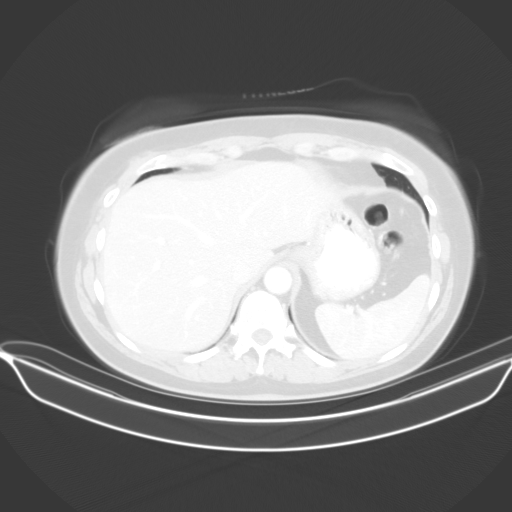
[im 88/95  soft-tissue]
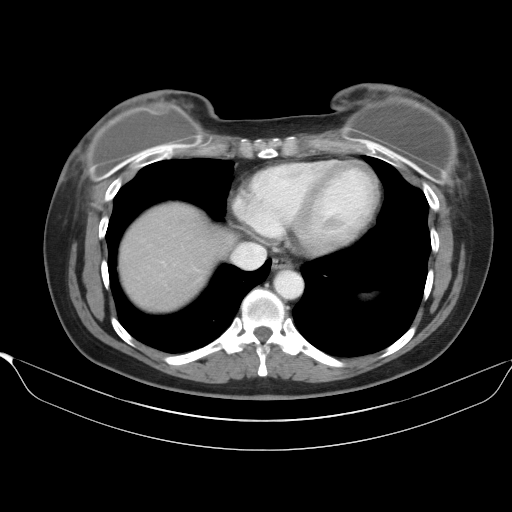
[im 88/95  lung]
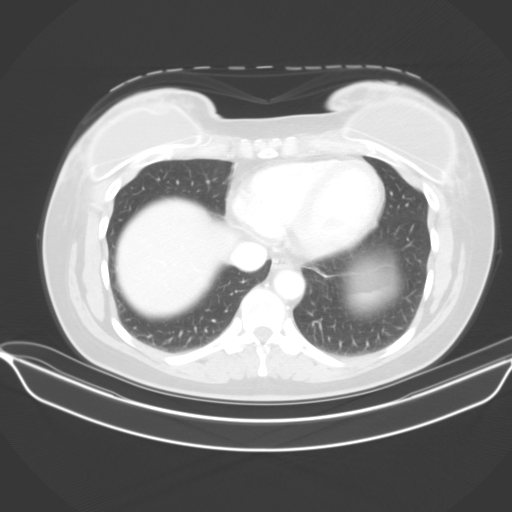

[12 of 32 positions shown; findings below may reference images not displayed]

FINDINGS: Lower Chest: No acute findings.

Hepatobiliary: A subtle lesion with heterogeneous enhancement is
seen in the posterior right lobe on image [DATE] which measures
approximately 3 cm. A possible smaller but similar appearing lesion
is seen in the lateral segment of the left lobe on image [DATE] which
measures 1.3 cm.

Gallbladder is distended and shows mild diffuse wall thickening.
Mild diffuse biliary ductal dilatation is also seen with common bile
duct measuring 9 mm in diameter.

Pancreas: No mass or inflammatory changes. No evidence of pancreatic
ductal dilatation.

Spleen: Within normal limits in size and appearance.

Adrenals/Urinary Tract: No masses identified. No evidence of
ureteral calculi or hydronephrosis. Unremarkable unopacified urinary
bladder.

Stomach/Bowel: No evidence of obstruction, inflammatory process or
abnormal fluid collections.

Vascular/Lymphatic: No pathologically enlarged lymph nodes. No
abdominal aortic aneurysm.

Reproductive: No mass or other significant abnormality. IUD seen in
expected position in the uterus.

Other:  None.

Musculoskeletal:  No suspicious bone lesions identified.
IMPRESSION: 1. Distended gallbladder with mild diffuse wall thickening,
suspicious for acute cholecystitis.
2. Mild diffuse biliary ductal dilatation. Recommend correlation
with liver function tests, and consider abdomen MRI and MRCP without
and with contrast for further evaluation.
3. Suspect small hepatic masses, which are indeterminate. Abdomen
MRI without and with contrast is recommended for further
characterization.

These results will be called to the ordering clinician or
representative by the Radiologist Assistant, and communication
documented in the PACS or [REDACTED].

## 2021-04-02 ENCOUNTER — Telehealth: Payer: Self-pay

## 2021-04-02 NOTE — Telephone Encounter (Signed)
[place], MD  You 3 days ago     Please reach out to Dr. Louann Sjogren office at [facility] and ask for his interpretation and recommendations for the patient. I would assume this MRI will be reviewed at a multidisciplinary conference. Thanks   Called Dr. Manson Allan at [phone] and left message with his nurse regarding above. They have contact name and number to call back.

## 2021-04-02 NOTE — Telephone Encounter (Signed)
Thanks for the reassuring news. Please let the patient know. I would ask her to follow-up with Dr. Manson Allan to see if he recommends any additional imaging given these results. Thanks.

## 2021-04-02 NOTE — Telephone Encounter (Signed)
Dr. Manson Allan called back and stated that the lesion was non-specific 3cm lesion and if anything it is smaller and most likely benign. Dr. Tarri Glenn notified.

## 2021-04-02 NOTE — Telephone Encounter (Signed)
Pt aware.

## 2021-05-08 DIAGNOSIS — D0511 Intraductal carcinoma in situ of right breast: Secondary | ICD-10-CM | POA: Diagnosis not present

## 2021-05-19 ENCOUNTER — Other Ambulatory Visit (HOSPITAL_COMMUNITY): Payer: Self-pay

## 2021-06-26 ENCOUNTER — Other Ambulatory Visit (HOSPITAL_COMMUNITY): Payer: Self-pay

## 2021-07-28 ENCOUNTER — Other Ambulatory Visit: Payer: Self-pay

## 2021-07-28 ENCOUNTER — Other Ambulatory Visit (INDEPENDENT_AMBULATORY_CARE_PROVIDER_SITE_OTHER): Payer: 59

## 2021-07-28 DIAGNOSIS — E559 Vitamin D deficiency, unspecified: Secondary | ICD-10-CM | POA: Diagnosis not present

## 2021-07-28 DIAGNOSIS — E785 Hyperlipidemia, unspecified: Secondary | ICD-10-CM

## 2021-07-28 LAB — LIPID PANEL
Cholesterol: 163 mg/dL (ref 0–200)
HDL: 36.3 mg/dL — ABNORMAL LOW (ref >39.00)
LDL Cholesterol: 110 mg/dL — ABNORMAL HIGH (ref 0–99)
NonHDL: 127.13
Total CHOL/HDL Ratio: 5
Triglycerides: 88 mg/dL (ref 0.0–149.0)
VLDL: 17.6 mg/dL (ref 0.0–40.0)

## 2021-07-28 LAB — VITAMIN D 25 HYDROXY (VIT D DEFICIENCY, FRACTURES): VITD: 41.04 ng/mL (ref 30.00–100.00)

## 2021-08-04 DIAGNOSIS — Z6826 Body mass index (BMI) 26.0-26.9, adult: Secondary | ICD-10-CM | POA: Diagnosis not present

## 2021-08-04 DIAGNOSIS — Z30431 Encounter for routine checking of intrauterine contraceptive device: Secondary | ICD-10-CM | POA: Diagnosis not present

## 2021-08-04 DIAGNOSIS — Z01419 Encounter for gynecological examination (general) (routine) without abnormal findings: Secondary | ICD-10-CM | POA: Diagnosis not present

## 2021-08-04 DIAGNOSIS — H5213 Myopia, bilateral: Secondary | ICD-10-CM | POA: Diagnosis not present

## 2021-09-15 ENCOUNTER — Encounter: Payer: Self-pay | Admitting: Internal Medicine

## 2021-09-29 ENCOUNTER — Other Ambulatory Visit: Payer: Self-pay

## 2021-10-03 ENCOUNTER — Ambulatory Visit: Payer: 59 | Admitting: Plastic Surgery

## 2021-10-03 ENCOUNTER — Other Ambulatory Visit: Payer: Self-pay

## 2021-10-03 ENCOUNTER — Encounter: Payer: Self-pay | Admitting: Plastic Surgery

## 2021-10-03 DIAGNOSIS — C50411 Malignant neoplasm of upper-outer quadrant of right female breast: Secondary | ICD-10-CM | POA: Diagnosis not present

## 2021-10-03 DIAGNOSIS — Z170 Estrogen receptor positive status [ER+]: Secondary | ICD-10-CM | POA: Diagnosis not present

## 2021-10-03 NOTE — Progress Notes (Signed)
   Subjective:    Patient ID: Megan [name], female    DOB: 10/14/1971, 50 y.o.   MRN: [id number]  The patient is a 50 year old female here for a 1 year follow-up on her breast reconstruction.  She had bilateral nipple sparing mastectomies with expanders followed by implant placement.  She has Mentor smooth round high-profile extra gel 520 cc silicone implants in place.  She is doing really well and looks even better than she did a year ago.  There is no capsular contracture and no concerning areas of fat necrosis or masses.  She has the same loss of volume in the upper poles.  She may have lost just a little bit of weight and that may have added to it.  No other major changes.  Her husband had cardiac surgery August and she is doing very well now.   Review of Systems  Constitutional: Negative.  Negative for activity change.  Eyes: Negative.   Respiratory: Negative.    Cardiovascular: Negative.   Gastrointestinal: Negative.   Endocrine: Negative.   Genitourinary: Negative.   Musculoskeletal: Negative.  Negative for back pain.  Skin: Negative.  Negative for color change and pallor.  Neurological: Negative.   Hematological: Negative.   Psychiatric/Behavioral: Negative.        Objective:   Physical Exam Vitals and nursing note reviewed.  Constitutional:      Appearance: Normal appearance.  HENT:     Head: Normocephalic and atraumatic.  Cardiovascular:     Rate and Rhythm: Normal rate.     Pulses: Normal pulses.  Pulmonary:     Effort: Pulmonary effort is normal.  Abdominal:     General: There is no distension.     Palpations: Abdomen is soft. There is no mass.     Tenderness: There is no abdominal tenderness. There is no guarding.     Hernia: No hernia is present.  Skin:    Capillary Refill: Capillary refill takes less than 2 seconds.     Coloration: Skin is not jaundiced.     Findings: No bruising.  Neurological:     Mental Status: She is alert and oriented to person, place,  and time.  Psychiatric:        Mood and Affect: Mood normal.        Behavior: Behavior normal.        Thought Content: Thought content normal.        Assessment & Plan:     ICD-10-CM   1. Malignant neoplasm of upper-outer quadrant of right breast in female, estrogen receptor positive ([name])  C50.411    Z17.0      The patient could certainly have fat graft if she wants and there is no time out for that.  She can do that based on her schedule.  He would like to finish off the year with no additional surgeries.  I completely understand.  I would like to see her back in 1 year. Pictures were obtained of the patient and placed in the chart with the patient's or guardian's permission.

## 2021-11-04 ENCOUNTER — Ambulatory Visit: Payer: 59 | Admitting: Plastic Surgery

## 2022-01-20 ENCOUNTER — Encounter: Payer: Self-pay | Admitting: Internal Medicine

## 2022-08-26 DIAGNOSIS — Z6828 Body mass index (BMI) 28.0-28.9, adult: Secondary | ICD-10-CM | POA: Diagnosis not present

## 2022-08-26 DIAGNOSIS — Z1151 Encounter for screening for human papillomavirus (HPV): Secondary | ICD-10-CM | POA: Diagnosis not present

## 2022-08-26 DIAGNOSIS — Z01419 Encounter for gynecological examination (general) (routine) without abnormal findings: Secondary | ICD-10-CM | POA: Diagnosis not present

## 2022-08-26 DIAGNOSIS — Z124 Encounter for screening for malignant neoplasm of cervix: Secondary | ICD-10-CM | POA: Diagnosis not present

## 2022-09-22 DIAGNOSIS — R87612 Low grade squamous intraepithelial lesion on cytologic smear of cervix (LGSIL): Secondary | ICD-10-CM | POA: Diagnosis not present

## 2022-09-22 DIAGNOSIS — N870 Mild cervical dysplasia: Secondary | ICD-10-CM | POA: Diagnosis not present

## 2022-10-07 ENCOUNTER — Ambulatory Visit: Payer: 59 | Admitting: Student

## 2022-10-07 DIAGNOSIS — Z08 Encounter for follow-up examination after completed treatment for malignant neoplasm: Secondary | ICD-10-CM

## 2022-10-07 DIAGNOSIS — Z853 Personal history of malignant neoplasm of breast: Secondary | ICD-10-CM | POA: Diagnosis not present

## 2022-10-07 DIAGNOSIS — Z9889 Other specified postprocedural states: Secondary | ICD-10-CM

## 2022-10-07 DIAGNOSIS — Z9013 Acquired absence of bilateral breasts and nipples: Secondary | ICD-10-CM

## 2022-10-07 NOTE — Progress Notes (Signed)
Referring Provider Isaac Bliss, Rayford Halsted, MD [place],  [place] [id number]   CC:  Chief Complaint  Patient presents with   Follow-up      Megan [name] is an 51 y.o. female.  HPI: Patient is a 51 year old female with history of breast cancer.  She underwent bilateral nipple sparing mastectomies with Dr. Marlou Starks on 04/08/2020 followed by immediate bilateral breast reconstruction with placement of acellular dermal matrix and tissue expanders on the same day as well with Dr. Marla Roe.  She later went on to have the bilateral exchange of tissue expanders for implants on 07/10/2020 with Dr. Marla Roe.  She had 450 cc Mentor memory smooth high-profile extra gel implants placed bilaterally.  Patient was last seen in the clinic on 10/03/2021 for her 1 year follow-up from breast reconstruction.  At this visit, she reported she was doing really well.  She had a little bit of volume loss in the upper poles.  The patient and Dr. Marla Roe talked about the potential for fat grafting if she wanted it.  Patient did not want fat grafting at that time .  Plan was for patient to follow-up in 1 year.  Today, patient reports she is doing well. She states that she feels the implants have fallen a little bit inferiorly, but is overall happy with her results.  Patient reports that she sometimes has some aching to her breast bilaterally, but it is not anything she needs to take medicine for and it only happens occasionally.  Patient denies any other issues or complaints at this time.  She states that she does not want to pursue fat grafting at this time, but possibly in the future.  Patient states she would like to avoid surgery at this time.  Review of Systems General: No recent changes in her health  Physical Exam    03/26/2021    8:06 AM 11/01/2020   10:34 AM 09/16/2020    1:32 PM  Vitals with BMI  Height 5' 5.5"  5' 5.5"  Weight 179 lbs 2 oz  175 lbs  BMI 80.32  12.24  Systolic 825  003 704  Diastolic 88 85 80  Pulse 73 62 84    General:  No acute distress,  Alert and oriented, Non-Toxic, Normal speech and affect Chaperone present on exam.  On exam, patient is sitting upright in no acute distress.  Implants are in place bilaterally.  They are soft and symmetric.  There is no overlying erythema or ecchymosis.  There is a little bit of volume upper poles bilaterally.  There is a little bit of excess tissue noted to the lateral aspects of the breast bilaterally.  There are no lumps or masses palpated on exam.  Assessment/Plan  S/P breast reconstruction, bilateral   I discussed with the patient that if she ever does want to fat grafting to improve her volume, she may reach out to [place] at any time to see if this would be something we can move forward with.  Patient states that overall right now she is happy with her results but she might consider it for next year.   Plan is to see patient back in 1 year again for follow-up, and if she would like to discuss any further surgery sooner.  Patient was in agreement.  I instructed the patient to call if she has any questions or concerns.  Pictures were obtained of the patient and placed in the chart with the patient's or guardian's  permission.   Clance Boll 10/07/2022, 3:33 PM

## 2022-10-09 ENCOUNTER — Ambulatory Visit: Payer: 59 | Admitting: Plastic Surgery

## 2022-12-17 DIAGNOSIS — H40013 Open angle with borderline findings, low risk, bilateral: Secondary | ICD-10-CM | POA: Diagnosis not present

## 2022-12-17 DIAGNOSIS — H5213 Myopia, bilateral: Secondary | ICD-10-CM | POA: Diagnosis not present

## 2023-03-24 DIAGNOSIS — R87612 Low grade squamous intraepithelial lesion on cytologic smear of cervix (LGSIL): Secondary | ICD-10-CM | POA: Diagnosis not present

## 2023-05-26 ENCOUNTER — Ambulatory Visit (INDEPENDENT_AMBULATORY_CARE_PROVIDER_SITE_OTHER): Payer: Commercial Managed Care - PPO | Admitting: Internal Medicine

## 2023-05-26 ENCOUNTER — Encounter: Payer: Self-pay | Admitting: Internal Medicine

## 2023-05-26 VITALS — BP 130/84 | HR 87 | Temp 98.4°F | Ht 65.5 in | Wt 172.7 lb

## 2023-05-26 DIAGNOSIS — Z114 Encounter for screening for human immunodeficiency virus [HIV]: Secondary | ICD-10-CM | POA: Diagnosis not present

## 2023-05-26 DIAGNOSIS — Z1159 Encounter for screening for other viral diseases: Secondary | ICD-10-CM

## 2023-05-26 DIAGNOSIS — E538 Deficiency of other specified B group vitamins: Secondary | ICD-10-CM

## 2023-05-26 DIAGNOSIS — E559 Vitamin D deficiency, unspecified: Secondary | ICD-10-CM | POA: Diagnosis not present

## 2023-05-26 DIAGNOSIS — Z0000 Encounter for general adult medical examination without abnormal findings: Secondary | ICD-10-CM

## 2023-05-26 DIAGNOSIS — Z1283 Encounter for screening for malignant neoplasm of skin: Secondary | ICD-10-CM | POA: Diagnosis not present

## 2023-05-26 DIAGNOSIS — E785 Hyperlipidemia, unspecified: Secondary | ICD-10-CM | POA: Diagnosis not present

## 2023-05-26 LAB — CBC WITH DIFFERENTIAL/PLATELET
Basophils Absolute: 0.1 10*3/uL (ref 0.0–0.1)
Basophils Relative: 0.9 % (ref 0.0–3.0)
Eosinophils Absolute: 0.1 10*3/uL (ref 0.0–0.7)
Eosinophils Relative: 1.4 % (ref 0.0–5.0)
HCT: 42 % (ref 36.0–46.0)
Hemoglobin: 13.9 g/dL (ref 12.0–15.0)
Lymphocytes Relative: 24.2 % (ref 12.0–46.0)
Lymphs Abs: 2.1 10*3/uL (ref 0.7–4.0)
MCHC: 33.2 g/dL (ref 30.0–36.0)
MCV: 92.6 fl (ref 78.0–100.0)
Monocytes Absolute: 0.7 10*3/uL (ref 0.1–1.0)
Monocytes Relative: 8 % (ref 3.0–12.0)
Neutro Abs: 5.6 10*3/uL (ref 1.4–7.7)
Neutrophils Relative %: 65.5 % (ref 43.0–77.0)
Platelets: 311 10*3/uL (ref 150.0–400.0)
RBC: 4.53 Mil/uL (ref 3.87–5.11)
RDW: 13.5 % (ref 11.5–15.5)
WBC: 8.5 10*3/uL (ref 4.0–10.5)

## 2023-05-26 LAB — LIPID PANEL
Cholesterol: 197 mg/dL (ref 0–200)
HDL: 46.8 mg/dL (ref >39.00)
LDL Cholesterol: 122 mg/dL — ABNORMAL HIGH (ref 0–99)
NonHDL: 150.05
Total CHOL/HDL Ratio: 4
Triglycerides: 138 mg/dL (ref 0.0–149.0)
VLDL: 27.6 mg/dL (ref 0.0–40.0)

## 2023-05-26 LAB — COMPREHENSIVE METABOLIC PANEL
ALT: 14 U/L (ref 0–35)
AST: 17 U/L (ref 0–37)
Albumin: 4.7 g/dL (ref 3.5–5.2)
Alkaline Phosphatase: 71 U/L (ref 39–117)
BUN: 11 mg/dL (ref 6–23)
CO2: 24 mEq/L (ref 19–32)
Calcium: 9.2 mg/dL (ref 8.4–10.5)
Chloride: 106 mEq/L (ref 96–112)
Creatinine, Ser: 0.73 mg/dL (ref 0.40–1.20)
GFR: 96.37 mL/min (ref >60.00)
Glucose, Bld: 114 mg/dL — ABNORMAL HIGH (ref 70–99)
Potassium: 4.1 mEq/L (ref 3.5–5.1)
Sodium: 139 mEq/L (ref 135–145)
Total Bilirubin: 1.2 mg/dL (ref 0.2–1.2)
Total Protein: 7.6 g/dL (ref 6.0–8.3)

## 2023-05-26 LAB — VITAMIN D 25 HYDROXY (VIT D DEFICIENCY, FRACTURES): VITD: 46.75 ng/mL (ref 30.00–100.00)

## 2023-05-26 LAB — HEMOGLOBIN A1C: Hgb A1c MFr Bld: 5.5 % (ref 4.6–6.5)

## 2023-05-26 LAB — VITAMIN B12: Vitamin B-12: 301 pg/mL (ref 211–911)

## 2023-05-26 LAB — TSH: TSH: 2.65 u[IU]/mL (ref 0.35–5.50)

## 2023-05-26 NOTE — Progress Notes (Signed)
Established Patient Office Visit     CC/Reason for Visit: Annual preventive exam  HPI: Megan [name] is a 52 y.o. female who is coming in today for the above mentioned reasons. 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  She also has a history of Coley and choledocholithiasis that required ERCP and cholecystectomy same year.  She has routine eye and dental care.  She is due for COVID and flu vaccines that she will get in the fall.  She had a colonoscopy in 2019, no longer does mammograms.  Will obtain Pap smear records from GYN.   Past Medical/Surgical History: Past Medical History:  Diagnosis Date   Allergic rhinitis    Allergy    Breast cancer (HCC)    BIL BREAST   Family history of breast cancer    Family history of colon cancer    Family history of prostate cancer    GERD (gastroesophageal reflux disease)    MRSA infection 2017   left buttock   PONV (postoperative nausea and vomiting)    SVD (spontaneous vaginal delivery)    x 1   TB lung, latent    +  quantiferon, neg PPD - s/p treatment - completed    neg cxr    Past Surgical History:  Procedure Laterality Date   BILIARY DILATION  05/30/2020   Procedure: BILIARY DILATION;  Surgeon: Lynann Bologna, MD;  Location: [facility];  Service: Endoscopy;;   BREAST BIOPSY Right 08/24/2012   benign   BREAST RECONSTRUCTION WITH PLACEMENT OF TISSUE EXPANDER AND FLEX HD (ACELLULAR HYDRATED DERMIS) Bilateral 04/08/2020   Procedure: BILATERAL BREAST RECONSTRUCTION WITH PLACEMENT OF TISSUE EXPANDER AND FLEX HD (ACELLULAR HYDRATED DERMIS);  Surgeon: Peggye Form, DO;  Location: [facility];  Service: Plastics;  Laterality: Bilateral;   CESAREAN SECTION     x 1   CHOLECYSTECTOMY N/A 06/14/2020   Procedure: LAPAROSCOPIC CHOLECYSTECTOMY WITH ATTEMPTED INTRAOPERATIVE CHOLANGIOGRAM;  Surgeon: Griselda Miner, MD;  [facility]: [facility];  Service:  General;  Laterality: N/A;   DILATION AND CURETTAGE OF UTERUS     x 2   ENDOSCOPIC RETROGRADE CHOLANGIOPANCREATOGRAPHY (ERCP) WITH PROPOFOL N/A 05/30/2020   Procedure: ENDOSCOPIC RETROGRADE CHOLANGIOPANCREATOGRAPHY (ERCP) WITH PROPOFOL;  Surgeon: Lynann Bologna, MD;  [facility];  Service: Endoscopy;  Laterality: N/A;   NIPPLE SPARING MASTECTOMY WITH SENTINEL LYMPH NODE BIOPSY Bilateral 04/08/2020   Procedure: RIGHT NIPPLE SPARING MASTECTOMY WITH RIGHT SENTINEL LYMPH NODE BIOPSY, LEFT PROPHYLACTIC MASTECTOMY;  Surgeon: Griselda Miner, MD;  Location: [facility];  Service: General;  Laterality: Bilateral;   REMOVAL OF BILATERAL TISSUE EXPANDERS WITH PLACEMENT OF BILATERAL BREAST IMPLANTS Bilateral 07/10/2020   Procedure: REMOVAL OF BILATERAL TISSUE EXPANDERS WITH PLACEMENT OF BILATERAL BREAST IMPLANTS;  Surgeon: Peggye Form, DO;  Location: [facility];  Service: Plastics;  Laterality: Bilateral;   REMOVAL OF STONES  05/30/2020   Procedure: REMOVAL OF STONES;  Surgeon: Lynann Bologna, MD;  [facility];  Service: Endoscopy;;   SPHINCTEROTOMY  05/30/2020   Procedure: Dennison Mascot;  Surgeon: Lynann Bologna, MD;  [facility];  Service: Endoscopy;;   WISDOM TOOTH EXTRACTION      Social History:  reports that she has never smoked. She has never used smokeless tobacco. She reports that she does not currently use alcohol. She reports that she does not use drugs.  Allergies: No Known Allergies  Family History:  Family History  Problem Relation Age of Onset   Hypertension Mother    Breast cancer Mother 24   Hyperlipidemia Father    Diabetes Father    Crohn's disease Father    Stroke Maternal Grandmother    Diabetes Maternal Grandmother    Prostate cancer Paternal Uncle    Colon cancer Paternal Grandmother    Breast cancer Other    Rectal cancer Neg Hx    Stomach cancer Neg Hx    Esophageal cancer Neg Hx      Current  Outpatient Medications:    cetirizine (ZYRTEC) 10 MG tablet, Take 10 mg by mouth daily., Disp: , Rfl:    esomeprazole (NEXIUM) 40 MG capsule, Take 1 capsule (40 mg total) by mouth daily. (Patient taking differently: Take 20 mg by mouth daily.), Disp: 14 capsule, Rfl: 0   fluticasone (FLONASE) 50 MCG/ACT nasal spray, Place 2 sprays into both nostrils daily. (Patient taking differently: Place 1 spray into both nostrils daily.), Disp: 16 g, Rfl: 6   levonorgestrel (MIRENA) 20 MCG/24HR IUD, 1 each by Intrauterine route once., Disp: , Rfl:    Vitamin D, Ergocalciferol, (DRISDOL) 1.25 MG (50000 UNIT) CAPS capsule, Take 50,000 Units by mouth every 7 (seven) days., Disp: , Rfl:   Review of Systems:  Negative unless indicated in HPI.   Physical Exam: Vitals:   05/26/23 0757  BP: 130/84  Pulse: 87  Temp: 98.4 F (36.9 C)  TempSrc: Oral  SpO2: 98%  Weight: 172 lb 11.2 oz (78.3 kg)  Height: 5' 5.5" (1.664 m)    Body mass index is 28.3 kg/m.   Physical Exam Vitals reviewed.  Constitutional:      General: She is not in acute distress.    Appearance: Normal appearance. She is not ill-appearing, toxic-appearing or diaphoretic.  HENT:     Head: Normocephalic.     Right Ear: Tympanic membrane, ear canal and external ear normal. There is no impacted cerumen.     Left Ear: Tympanic membrane, ear canal and external ear normal. There is no impacted cerumen.     Nose: Nose normal.     Mouth/Throat:     Mouth: Mucous membranes are moist.     Pharynx: Oropharynx is clear. No oropharyngeal exudate or posterior oropharyngeal erythema.  Eyes:     General: No scleral icterus.       Right eye: No discharge.        Left eye: No discharge.     Conjunctiva/sclera: Conjunctivae normal.     Pupils: Pupils are equal, round, and reactive to light.  Neck:     Vascular: No carotid bruit.  Cardiovascular:     Rate and Rhythm: Normal rate and regular rhythm.     Pulses: Normal pulses.     Heart sounds:  Normal heart sounds.  Pulmonary:     Effort: Pulmonary effort is normal. No respiratory distress.     Breath sounds: Normal breath sounds.  Abdominal:     General: Abdomen is flat. Bowel sounds are normal.     Palpations: Abdomen is soft.  Musculoskeletal:        General: Normal range of motion.     Cervical back: Normal range of motion.  Skin:    General: Skin is warm and dry.  Neurological:     General: No focal deficit present.     Mental Status: She is alert and oriented to person, place, and time. Mental status is at baseline.  Psychiatric:  Mood and Affect: Mood normal.        Behavior: Behavior normal.        Thought Content: Thought content normal.        Judgment: Judgment normal.     Flowsheet Row Office Visit from 05/26/2023 in [facility]  PHQ-9 Total Score 0        Impression and Plan:  Encounter for preventive health examination  Vitamin D deficiency -     VITAMIN D 25 Hydroxy (Vit-D Deficiency, Fractures); Future  Hyperlipidemia, unspecified hyperlipidemia type -     CBC with Differential/Platelet; Future -     Comprehensive metabolic panel; Future -     Lipid panel; Future -     TSH; Future -     Hemoglobin A1c  Vitamin B12 deficiency -     Vitamin B12; Future  Skin cancer screening -     Ambulatory referral to Dermatology  Encounter for hepatitis C screening test for low risk patient -     Hepatitis C antibody; Future  Encounter for screening for HIV -     HIV Antibody (routine testing w rflx); Future   -Recommend routine eye and dental care. -Healthy lifestyle discussed in detail. -Labs to be updated today. -Prostate cancer screening: N/A Health Maintenance  Topic Date Due   HIV Screening  Never done   Hepatitis C Screening  Never done   COVID-19 Vaccine (3 - 2023-24 season) 08/14/2022   Pap Smear  03/27/2023   Flu Shot  07/15/2023   DTaP/Tdap/Td vaccine (2 - Td or Tdap) 01/22/2025   Colon  Cancer Screening  12/01/2028   HPV Vaccine  Aged Out   -Dermatology referral for skin cancer screening.    Chaya Jan, MD [facility]

## 2023-05-27 LAB — HIV ANTIBODY (ROUTINE TESTING W REFLEX): HIV 1&2 Ab, 4th Generation: NONREACTIVE

## 2023-05-27 LAB — HEPATITIS C ANTIBODY: Hepatitis C Ab: NONREACTIVE

## 2023-09-08 ENCOUNTER — Encounter: Payer: Self-pay | Admitting: Dermatology

## 2023-09-08 ENCOUNTER — Ambulatory Visit: Payer: Commercial Managed Care - PPO | Admitting: Dermatology

## 2023-09-08 DIAGNOSIS — L814 Other melanin hyperpigmentation: Secondary | ICD-10-CM

## 2023-09-08 DIAGNOSIS — D1801 Hemangioma of skin and subcutaneous tissue: Secondary | ICD-10-CM

## 2023-09-08 DIAGNOSIS — L720 Epidermal cyst: Secondary | ICD-10-CM

## 2023-09-08 DIAGNOSIS — W908XXA Exposure to other nonionizing radiation, initial encounter: Secondary | ICD-10-CM

## 2023-09-08 DIAGNOSIS — L821 Other seborrheic keratosis: Secondary | ICD-10-CM | POA: Diagnosis not present

## 2023-09-08 DIAGNOSIS — L578 Other skin changes due to chronic exposure to nonionizing radiation: Secondary | ICD-10-CM | POA: Diagnosis not present

## 2023-09-08 DIAGNOSIS — D229 Melanocytic nevi, unspecified: Secondary | ICD-10-CM

## 2023-09-08 DIAGNOSIS — Z1283 Encounter for screening for malignant neoplasm of skin: Secondary | ICD-10-CM

## 2023-09-08 NOTE — Progress Notes (Signed)
   New Patient Visit   Subjective  Megan [name] is a 52 y.o. female who presents for the following: Skin Cancer Screening and Full Body Skin Exam - No personal or family history of skin cancer or abnormal moles  The patient presents for Total-Body Skin Exam (TBSE) for skin cancer screening and mole check. The patient has spots, moles and lesions to be evaluated, some may be new or changing.  The following portions of the chart were reviewed this encounter and updated as appropriate: medications, allergies, medical history  Review of Systems:  No other skin or systemic complaints except as noted in HPI or Assessment and Plan.  Objective  Well appearing patient in no apparent distress; mood and affect are within normal limits.  A full examination was performed including scalp, head, eyes, ears, nose, lips, neck, chest, axillae, abdomen, back, buttocks, bilateral upper extremities, bilateral lower extremities, hands, feet, fingers, toes, fingernails, and toenails. All findings within normal limits unless otherwise noted below.   Relevant physical exam findings are noted in the Assessment and Plan.   Assessment & Plan   SKIN CANCER SCREENING PERFORMED TODAY.  ACTINIC DAMAGE - Chronic condition, secondary to cumulative UV/sun exposure - diffuse scaly erythematous macules with underlying dyspigmentation - Recommend daily broad spectrum sunscreen SPF 30+ to sun-exposed areas, reapply every 2 hours as needed.  - Staying in the shade or wearing long sleeves, sun glasses (UVA+UVB protection) and wide brim hats (4-inch brim around the entire circumference of the hat) are also recommended for sun protection.  - Call for new or changing lesions.  LENTIGINES, SEBORRHEIC KERATOSES, HEMANGIOMAS - Benign normal skin lesions - Benign-appearing - Call for any changes  MELANOCYTIC NEVI - Tan-brown and/or pink-flesh-colored symmetric macules and papules - Benign appearing on exam today -  Observation - Call clinic for new or changing moles - Recommend daily use of broad spectrum spf 30+ sunscreen to sun-exposed areas.   EPIDERMAL INCLUSION CYST Exam: Subcutaneous nodule at right neck  Benign-appearing. Exam most consistent with an epidermal inclusion cyst. Discussed that a cyst is a benign growth that can grow over time and sometimes get irritated or inflamed. Recommend observation if it is not bothersome. Discussed option of surgical excision to remove it if it is growing, symptomatic, or other changes noted. Please call for new or changing lesions so they can be evaluated.  Return if symptoms worsen or fail to improve.  I, Joanie Coddington, CMA, am acting as scribe for Gwenith Daily, MD .   Documentation: I have reviewed the above documentation for accuracy and completeness, and I agree with the above.  Gwenith Daily, MD

## 2023-09-08 NOTE — Patient Instructions (Signed)

## 2023-10-08 ENCOUNTER — Ambulatory Visit: Payer: 59 | Admitting: Plastic Surgery

## 2023-10-15 ENCOUNTER — Ambulatory Visit: Payer: Commercial Managed Care - PPO | Admitting: Plastic Surgery

## 2023-10-15 ENCOUNTER — Encounter: Payer: Self-pay | Admitting: Plastic Surgery

## 2023-10-15 VITALS — BP 121/87 | HR 88 | Ht 65.0 in | Wt 174.4 lb

## 2023-10-15 DIAGNOSIS — Z853 Personal history of malignant neoplasm of breast: Secondary | ICD-10-CM

## 2023-10-15 DIAGNOSIS — Z9013 Acquired absence of bilateral breasts and nipples: Secondary | ICD-10-CM

## 2023-10-15 DIAGNOSIS — Z170 Estrogen receptor positive status [ER+]: Secondary | ICD-10-CM

## 2023-10-15 DIAGNOSIS — Z08 Encounter for follow-up examination after completed treatment for malignant neoplasm: Secondary | ICD-10-CM

## 2023-10-15 DIAGNOSIS — Z9889 Other specified postprocedural states: Secondary | ICD-10-CM

## 2023-10-15 NOTE — Progress Notes (Signed)
Patient ID: Megan [name], female    DOB: 07-Feb-1971, 52 y.o.   MRN: [id number]   Chief Complaint  Patient presents with   Follow-up   Breast Problem    The patient is a 52 year old female here for follow-up on her breast reconstruction.  In 2021 she was diagnosed with right breast ductal carcinoma in situ after screening mammogram.  It was intermediate grade and was progesterone positive.  She was working for [employer] as a Engineer, civil (consulting) at the time.  She underwent bilateral mastectomies in April 2021 with expander placement.  In July 2021 the expanders were removed and Mentor smooth high-profile extra gel 450 cc implants were placed.  Since then she has been doing well.  She may have lost a little bit of weight.  Today she is 5 5 and 174 pounds.  She has a little bit of loss of upper medial pole fullness.  But overall the implants are nice and soft and have some movement.  No areas of concern.    Review of Systems  Constitutional: Negative.   HENT: Negative.    Eyes: Negative.   Respiratory: Negative.    Cardiovascular: Negative.   Gastrointestinal: Negative.   Endocrine: Negative.   Genitourinary: Negative.   Musculoskeletal: Negative.     Past Medical History:  Diagnosis Date   Allergic rhinitis    Allergy    Breast cancer (HCC)    BIL BREAST   Family history of breast cancer    Family history of colon cancer    Family history of prostate cancer    GERD (gastroesophageal reflux disease)    MRSA infection 2017   left buttock   PONV (postoperative nausea and vomiting)    SVD (spontaneous vaginal delivery)    x 1   TB lung, latent    +  quantiferon, neg PPD - s/p treatment - completed    neg cxr    Past Surgical History:  Procedure Laterality Date   BILIARY DILATION  05/30/2020   Procedure: BILIARY DILATION;  Surgeon: Lynann Bologna, MD;  [facility];  Service: Endoscopy;;   BREAST BIOPSY Right 08/24/2012   benign   BREAST RECONSTRUCTION WITH PLACEMENT OF TISSUE  EXPANDER AND FLEX HD (ACELLULAR HYDRATED DERMIS) Bilateral 04/08/2020   Procedure: BILATERAL BREAST RECONSTRUCTION WITH PLACEMENT OF TISSUE EXPANDER AND FLEX HD (ACELLULAR HYDRATED DERMIS);  Surgeon: Peggye Form, DO;  Location: [facility];  Service: Plastics;  Laterality: Bilateral;   CESAREAN SECTION     x 1   CHOLECYSTECTOMY N/A 06/14/2020   Procedure: LAPAROSCOPIC CHOLECYSTECTOMY WITH ATTEMPTED INTRAOPERATIVE CHOLANGIOGRAM;  Surgeon: Griselda Miner, MD;  Location: [facility];  Service: General;  Laterality: N/A;   DILATION AND CURETTAGE OF UTERUS     x 2   ENDOSCOPIC RETROGRADE CHOLANGIOPANCREATOGRAPHY (ERCP) WITH PROPOFOL N/A 05/30/2020   Procedure: ENDOSCOPIC RETROGRADE CHOLANGIOPANCREATOGRAPHY (ERCP) WITH PROPOFOL;  Surgeon: Lynann Bologna, MD;  [facility];  Service: Endoscopy;  Laterality: N/A;   NIPPLE SPARING MASTECTOMY WITH SENTINEL LYMPH NODE BIOPSY Bilateral 04/08/2020   Procedure: RIGHT NIPPLE SPARING MASTECTOMY WITH RIGHT SENTINEL LYMPH NODE BIOPSY, LEFT PROPHYLACTIC MASTECTOMY;  Surgeon: Griselda Miner, MD;  Location: [facility];  Service: General;  Laterality: Bilateral;   REMOVAL OF BILATERAL TISSUE EXPANDERS WITH PLACEMENT OF BILATERAL BREAST IMPLANTS Bilateral 07/10/2020   Procedure: REMOVAL OF BILATERAL TISSUE EXPANDERS WITH PLACEMENT OF BILATERAL BREAST IMPLANTS;  Surgeon: Peggye Form, DO;  Location: [facility];  Service: Government social research officer;  Laterality: Bilateral;   REMOVAL OF STONES  05/30/2020   Procedure: REMOVAL OF STONES;  Surgeon: Lynann Bologna, MD;  [facility];  Service: Endoscopy;;   SPHINCTEROTOMY  05/30/2020   Procedure: Dennison Mascot;  Surgeon: Lynann Bologna, MD;  [facility];  Service: Endoscopy;;   WISDOM TOOTH EXTRACTION        Current Outpatient Medications:    cholecalciferol (VITAMIN D3) 25 MCG (1000 UNIT) tablet, , Disp: , Rfl:    esomeprazole (NEXIUM) 40 MG capsule, Take 1  capsule (40 mg total) by mouth daily. (Patient taking differently: Take 20 mg by mouth daily.), Disp: 14 capsule, Rfl: 0   levonorgestrel (MIRENA) 20 MCG/24HR IUD, 1 each by Intrauterine route once., Disp: , Rfl:    Multiple Vitamin (MULTIVITAMIN ADULT PO), , Disp: , Rfl:    Objective:   Vitals:   10/15/23 0835  BP: 121/87  Pulse: 88  SpO2: 96%    Physical Exam Vitals and nursing note reviewed.  Constitutional:      Appearance: Normal appearance.  HENT:     Head: Atraumatic.  Cardiovascular:     Rate and Rhythm: Normal rate.     Pulses: Normal pulses.  Pulmonary:     Effort: Pulmonary effort is normal.  Abdominal:     Palpations: Abdomen is soft.  Musculoskeletal:        General: No swelling.  Skin:    General: Skin is warm.     Coloration: Skin is not jaundiced.  Neurological:     Mental Status: She is alert and oriented to person, place, and time.  Psychiatric:        Mood and Affect: Mood normal.        Behavior: Behavior normal.        Thought Content: Thought content normal.        Judgment: Judgment normal.     Assessment & Plan:  S/P breast reconstruction, bilateral  Malignant neoplasm of upper-outer quadrant of right breast in female, estrogen receptor positive (HCC)  Discussed timing for evaluation ultrasound.  Patient said that we could talk about that at her next visit.  So no changes as of today.  Call with any changes or areas of concern.  Will plan on seeing her back in 1 year.  It was great to see her and catch up.  Pictures were obtained of the patient and placed in the chart with the patient's or guardian's permission.  Alena Bills Veda Arrellano, DO

## 2024-05-17 NOTE — Progress Notes (Signed)
 ACUTE VISIT No chief complaint on file.  HPI: [name] is a 53 y.o. female with a PMHx significant for allergic rhinitis, GERD, vitamin D  deficiency, vitamin B12 deficiency, breast cancer, and HLD, who is here today complaining of swollen and painful lymph node in her left armpit.   Review of Systems See other pertinent positives and negatives in HPI.  Current Outpatient Medications on File Prior to Visit  Medication Sig Dispense Refill   cholecalciferol (VITAMIN D3) 25 MCG (1000 UNIT) tablet      esomeprazole  (NEXIUM ) 40 MG capsule Take 1 capsule (40 mg total) by mouth daily. (Patient taking differently: Take 20 mg by mouth daily.) 14 capsule 0   levonorgestrel  (MIRENA ) 20 MCG/24HR IUD 1 each by Intrauterine route once.     Multiple Vitamin (MULTIVITAMIN ADULT PO)      No current facility-administered medications on file prior to visit.    Past Medical History:  Diagnosis Date   Allergic rhinitis    Allergy    Breast cancer (HCC)    BIL BREAST   Family history of breast cancer    Family history of colon cancer    Family history of prostate cancer    GERD (gastroesophageal reflux disease)    MRSA infection 2017   left buttock   PONV (postoperative nausea and vomiting)    SVD (spontaneous vaginal delivery)    x 1   TB lung, latent    +  quantiferon, neg PPD - s/p treatment - completed    neg cxr   No Known Allergies  Social History   Socioeconomic History   Marital status: Married    Spouse name: Not on file   Number of children: 2   Years of education: Not on file   Highest education level: Not on file  Occupational History   Occupation: Nurse Case Manager  Tobacco Use   Smoking status: Never   Smokeless tobacco: Never  Vaping Use   Vaping status: Never Used  Substance and Sexual Activity   Alcohol use: Not Currently    Comment: twice a year   Drug use: No   Sexual activity: Yes    Birth control/protection: I.U.D.    Comment: Mirena   IUD-Inserted 2018  Other Topics Concern   Not on file  Social History Narrative   Occupation: Charity fundraiser - [employer]   Married - 2 kids (12 and 8 in 2016)   Regular exercise- yes   Eating a healthy diet   Spiritual: catholic   Social Drivers of Corporate investment banker Strain: Not on file  Food Insecurity: Not on file  Transportation Needs: Not on file  Physical Activity: Not on file  Stress: Not on file  Social Connections: Not on file    There were no vitals filed for this visit. There is no height or weight on file to calculate BMI.  Physical Exam  ASSESSMENT AND PLAN:  [name] was seen today for swelling and tenderness in her armpit.   There are no diagnoses linked to this encounter.  No follow-ups on file.  I, Fritz Jewel Wierda, acting as a scribe for Kaitlin Ardito [place], MD., have documented all relevant documentation on the behalf of Rhea Kaelin [place], MD, as directed by  Scheryl Sanborn [place], MD while in the presence of Demetrice Combes [place], MD.   I, Jasiel Apachito [place], MD, have reviewed all documentation for this visit. The documentation on 05/17/24 for the exam, diagnosis, procedures, and orders are all accurate and complete.  Aryaan Persichetti G. [place], MD  [facility]. [place] office.  Discharge Instructions   None

## 2024-05-19 ENCOUNTER — Ambulatory Visit: Admitting: Family Medicine

## 2024-05-19 ENCOUNTER — Encounter: Payer: Self-pay | Admitting: Family Medicine

## 2024-05-19 ENCOUNTER — Other Ambulatory Visit: Payer: Self-pay | Admitting: Family Medicine

## 2024-05-19 VITALS — BP 120/70 | HR 76 | Temp 97.9°F | Resp 12 | Ht 65.0 in | Wt 174.0 lb

## 2024-05-19 DIAGNOSIS — R2232 Localized swelling, mass and lump, left upper limb: Secondary | ICD-10-CM | POA: Diagnosis not present

## 2024-05-19 DIAGNOSIS — Z853 Personal history of malignant neoplasm of breast: Secondary | ICD-10-CM

## 2024-05-19 NOTE — Patient Instructions (Addendum)
 A few things to remember from today's visit:  Axillary mass, left - Plan: US  LIMITED ULTRASOUND INCLUDING AXILLA LEFT BREAST , MM 3D DIAGNOSTIC MAMMOGRAM BILATERAL BREAST, Ambulatory referral to General Surgery  Hx of breast cancer - Plan: US  LIMITED ULTRASOUND INCLUDING AXILLA LEFT BREAST , MM 3D DIAGNOSTIC MAMMOGRAM BILATERAL BREAST, Ambulatory referral to General Surgery Do not use My Chart to request refills or for acute issues that need immediate attention. If you send a my chart message, it may take a few days to be addressed, specially if I am not in the office.  Please be sure medication list is accurate. If a new problem present, please set up appointment sooner than planned today.

## 2024-05-25 ENCOUNTER — Other Ambulatory Visit (HOSPITAL_COMMUNITY): Payer: Self-pay

## 2024-05-25 DIAGNOSIS — Z124 Encounter for screening for malignant neoplasm of cervix: Secondary | ICD-10-CM | POA: Diagnosis not present

## 2024-05-25 DIAGNOSIS — Z01419 Encounter for gynecological examination (general) (routine) without abnormal findings: Secondary | ICD-10-CM | POA: Diagnosis not present

## 2024-05-25 DIAGNOSIS — Z6828 Body mass index (BMI) 28.0-28.9, adult: Secondary | ICD-10-CM | POA: Diagnosis not present

## 2024-05-25 DIAGNOSIS — N951 Menopausal and female climacteric states: Secondary | ICD-10-CM | POA: Diagnosis not present

## 2024-05-25 MED ORDER — VEOZAH 45 MG PO TABS
45.0000 mg | ORAL_TABLET | Freq: Every day | ORAL | 12 refills | Status: AC
  Filled 2024-05-25: qty 30, 30d supply, fill #0
  Filled 2024-06-26: qty 30, 30d supply, fill #1
  Filled 2024-07-28: qty 30, 30d supply, fill #2
  Filled 2024-08-30: qty 30, 30d supply, fill #3
  Filled 2024-10-01: qty 30, 30d supply, fill #4
  Filled 2024-11-10: qty 30, 30d supply, fill #5
  Filled 2024-12-09: qty 30, 30d supply, fill #6

## 2024-06-01 ENCOUNTER — Ambulatory Visit
Admission: RE | Admit: 2024-06-01 | Discharge: 2024-06-01 | Disposition: A | Discharge status: Home / Self Care | Source: Ambulatory Visit | Attending: Family Medicine | Admitting: Family Medicine

## 2024-06-01 ENCOUNTER — Other Ambulatory Visit (HOSPITAL_COMMUNITY): Payer: Self-pay

## 2024-06-01 DIAGNOSIS — Z135 Encounter for screening for eye and ear disorders: Secondary | ICD-10-CM | POA: Diagnosis not present

## 2024-06-01 DIAGNOSIS — Z853 Personal history of malignant neoplasm of breast: Secondary | ICD-10-CM

## 2024-06-01 DIAGNOSIS — R2232 Localized swelling, mass and lump, left upper limb: Secondary | ICD-10-CM | POA: Diagnosis not present

## 2024-06-01 DIAGNOSIS — H524 Presbyopia: Secondary | ICD-10-CM | POA: Diagnosis not present

## 2024-06-07 ENCOUNTER — Ambulatory Visit: Admitting: Internal Medicine

## 2024-06-30 DIAGNOSIS — Z5181 Encounter for therapeutic drug level monitoring: Secondary | ICD-10-CM | POA: Diagnosis not present

## 2024-07-28 DIAGNOSIS — Z5181 Encounter for therapeutic drug level monitoring: Secondary | ICD-10-CM | POA: Diagnosis not present

## 2024-08-01 ENCOUNTER — Telehealth: Admitting: Physician Assistant

## 2024-08-01 DIAGNOSIS — L237 Allergic contact dermatitis due to plants, except food: Secondary | ICD-10-CM | POA: Diagnosis not present

## 2024-08-01 MED ORDER — TRIAMCINOLONE ACETONIDE 0.1 % EX CREA: 1.0000 | TOPICAL_CREAM | Freq: Two times a day (BID) | CUTANEOUS | 0 refills | Status: AC

## 2024-08-01 MED ORDER — PREDNISONE 10 MG PO TABS: ORAL_TABLET | ORAL | 0 refills | Status: AC

## 2024-08-01 NOTE — Patient Instructions (Signed)
 Megan [name], thank you for joining Megan Velma Lunger, PA-C for today's virtual visit.  While this provider is not your primary care provider (PCP), if your PCP is located in our provider database this encounter information will be shared with them immediately following your visit.   A [facility] MyChart account gives you access to today's visit and all your visits, tests, and labs performed at [facility]  click here if you don't have a [facility] MyChart account or go to [email or web address]  Consent: (Patient) Megan [name] provided verbal consent for this virtual visit at the beginning of the encounter.  Current Medications:  Current Outpatient Medications:    cholecalciferol (VITAMIN D3) 25 MCG (1000 UNIT) tablet, , Disp: , Rfl:    esomeprazole  (NEXIUM ) 40 MG capsule, Take 1 capsule (40 mg total) by mouth daily. (Patient taking differently: Take 20 mg by mouth daily.), Disp: 14 capsule, Rfl: 0   Fezolinetant  (VEOZAH ) 45 MG TABS, Take 1 tablet (45 mg total) by mouth daily., Disp: 30 tablet, Rfl: 12   levonorgestrel  (MIRENA ) 20 MCG/24HR IUD, 1 each by Intrauterine route once., Disp: , Rfl:    Multiple Vitamin (MULTIVITAMIN ADULT PO), , Disp: , Rfl:    Medications ordered in this encounter:  No orders of the defined types were placed in this encounter.    *If you need refills on other medications prior to your next appointment, please contact your pharmacy*  Follow-Up: Call back or seek an in-person evaluation if the symptoms worsen or if the condition fails to improve as anticipated.  [facility] [phone]  Other Instructions Poison Ivy Dermatitis Poison ivy dermatitis is redness and soreness of the skin caused by chemicals in the leaves of the poison ivy plant. You may have very bad itching, swelling, a rash, and blisters. What are the causes? Touching a poison ivy plant. Touching something that has the chemical on it. This may include  animals or objects that have come in contact with the plant. What increases the risk? Going outdoors often in wooded or [place] areas. Going outdoors without wearing protective clothing, such as closed shoes, long pants, and a long-sleeved shirt. What are the signs or symptoms?  Skin redness. Very bad itching. A rash that often includes bumps and blisters. The rash usually appears 48 hours after exposure, if you have had it before. If this is the first time you have it, the rash may not appear until a week after exposure. Swelling. This may occur if the reaction is very bad. Symptoms usually last for 1-2 weeks. The first time you get this condition, symptoms may last 3-4 weeks. How is this treated? This condition may be treated with: Hydrocortisone cream or calamine lotion to relieve itching. Oatmeal baths to soothe the skin. Medicines, such as over-the-counter antihistamine tablets. Oral steroid medicine for very bad reactions. Follow these instructions at home: Medicines Take or apply over-the-counter and prescription medicines only as told by your doctor. Use hydrocortisone cream or calamine lotion as needed to help with itching. General instructions Do not scratch or rub your skin. Put a cold, wet cloth (cold compress) on the affected areas or take baths in cool water. This will help with itching. Avoid hot baths and showers. Take oatmeal baths as needed. Use colloidal oatmeal. You can get this at a pharmacy or grocery store. Follow the instructions on the package. While you have the rash, wash your clothes right after you wear them. Check the affected area  every day for signs of infection. Check for: More redness, swelling, or pain. Fluid or blood. Warmth. Pus or a bad smell. Keep all follow-up visits. Your doctor may want to see how your skin is doing with treatment. How is this prevented?  Know what poison ivy looks like, so you can avoid it. This plant has three leaves  with flowering branches on a single stem. The leaves are glossy. The leaves have uneven edges that come to a point. If you touch poison ivy, wash your skin with soap and water right away. Be sure to wash under your fingernails. When hiking or camping, wear long pants, a long-sleeved shirt, long socks, and hiking boots. You can also use a lotion on your skin that helps to prevent contact with poison ivy. If you think that your clothes or outdoor gear came in contact with poison ivy, rinse them off with a garden hose before you bring them inside your house. When doing yard work or gardening, wear gloves, long sleeves, long pants, and boots. Wash your garden tools and gloves if they come in contact with poison ivy. If you think that your pet has come into contact with poison ivy, wash them with pet shampoo and water. Make sure to wear gloves while washing your pet. Contact a doctor if: You have open sores in the rash area. You have any signs of infection. You have redness that spreads past the rash area. You have a fever. You have a rash over a large area of your body. You have a rash on your eyes, mouth, or genitals. Your rash does not get better after a few weeks. Get help right away if: Your face swells or your eyes swell shut. You have trouble breathing. You have trouble swallowing. These symptoms may be an emergency. Do not wait to see if the symptoms will go away. Get help right away. Call 911. This information is not intended to replace advice given to you by your health care provider. Make sure you discuss any questions you have with your health care provider. Document Revised: 04/30/2022 Document Reviewed: 04/30/2022 Elsevier Patient Education  2024 Elsevier Inc.   If you have been instructed to have an in-person evaluation today at a local Urgent Care facility, please use the link below. It will take you to a list of all of our available [facility], including address,  phone number and hours of operation. Please do not delay care.  [facility]  If you or a family member do not have a primary care provider, use the link below to schedule a visit and establish care. When you choose a [facility] primary care physician or advanced practice provider, you gain a long-term partner in health. Find a Primary Care Provider  Learn more about [facility]'s in-office and virtual care options:  - Get Care Now

## 2024-08-01 NOTE — Progress Notes (Signed)
 Virtual Visit Consent   Megan [name], you are scheduled for a virtual visit with a [facility] provider today. Just as with appointments in the office, your consent must be obtained to participate. Your consent will be active for this visit and any virtual visit you may have with one of our providers in the next 365 days. If you have a MyChart account, a copy of this consent can be sent to you electronically.  As this is a virtual visit, video technology does not allow for your provider to perform a traditional examination. This may limit your provider's ability to fully assess your condition. If your provider identifies any concerns that need to be evaluated in person or the need to arrange testing (such as labs, EKG, etc.), we will make arrangements to do so. Although advances in technology are sophisticated, we cannot ensure that it will always work on either your end or our end. If the connection with a video visit is poor, the visit may have to be switched to a telephone visit. With either a video or telephone visit, we are not always able to ensure that we have a secure connection.  By engaging in this virtual visit, you consent to the provision of healthcare and authorize for your insurance to be billed (if applicable) for the services provided during this visit. Depending on your insurance coverage, you may receive a charge related to this service.  I need to obtain your verbal consent now. Are you willing to proceed with your visit today? Megan [name] has provided verbal consent on 08/01/2024 for a virtual visit (video or telephone). Megan [name], [place]  Date: 08/01/2024 9:19 AM   Virtual Visit via Video Note   I, Megan [name], connected with  OTHELIA RIEDERER  ([id number], 11/03/1971) on 08/01/24 at  9:15 AM EDT by a video-enabled telemedicine application and verified that I am speaking with the correct person using two identifiers.  Location: Patient: Virtual Visit Location  Patient: Home Provider: Virtual Visit Location Provider: Home Office   I discussed the limitations of evaluation and management by telemedicine and the availability of in person appointments. The patient expressed understanding and agreed to proceed.    History of Present Illness: Megan [name] is a 53 y.o. who identifies as a female who was assigned female at birth, and is being seen today for poison ivy rash first noted Monday. Notes she was working on [employer] this weekend. Was wearing gloves and long sleeves but was sweating a lot and having to readjust her glasses. Notes itchy rash of face, worsening since yesterday, now with isolated spots on her L anterior shoulder and R anterior forearm. Has been taking Zyrtec OTC and applying topical cortisone cream. Has to leave town this week and worried about worsening when she is away. Denies fever, chills, malaise or fatigue.   HPI: HPI  Problems:  Patient Active Problem List   Diagnosis Date Noted   Vitamin B12 deficiency 03/27/2021   Common bile duct stone    Obstructive jaundice    S/P breast reconstruction, bilateral 04/16/2020   Breast cancer (HCC) 04/08/2020   Genetic testing 03/08/2020   Family history of breast cancer    Family history of prostate cancer    Family history of colon cancer    Malignant neoplasm of upper-outer quadrant of right breast in female, estrogen receptor positive (HCC) 02/28/2020   GERD (gastroesophageal reflux disease) 11/17/2019   Vitamin D  deficiency 11/17/2019   Hyperlipidemia  11/17/2019   Allergic rhinitis 07/29/2007    Allergies: No Known Allergies Medications:  Current Outpatient Medications:    predniSONE  (DELTASONE ) 10 MG tablet, Take 4 tablets (40 mg total) by mouth daily with breakfast for 4 days, THEN 3 tablets (30 mg total) daily with breakfast for 4 days, THEN 2 tablets (20 mg total) daily with breakfast for 3 days, THEN 1 tablet (10 mg total) daily with breakfast for 3 days., Disp: 37  tablet, Rfl: 0   triamcinolone  cream (KENALOG ) 0.1 %, Apply 1 Application topically 2 (two) times daily., Disp: 30 g, Rfl: 0   cholecalciferol (VITAMIN D3) 25 MCG (1000 UNIT) tablet, , Disp: , Rfl:    esomeprazole  (NEXIUM ) 40 MG capsule, Take 1 capsule (40 mg total) by mouth daily. (Patient taking differently: Take 20 mg by mouth daily.), Disp: 14 capsule, Rfl: 0   Fezolinetant  (VEOZAH ) 45 MG TABS, Take 1 tablet (45 mg total) by mouth daily., Disp: 30 tablet, Rfl: 12   levonorgestrel  (MIRENA ) 20 MCG/24HR IUD, 1 each by Intrauterine route once., Disp: , Rfl:    Multiple Vitamin (MULTIVITAMIN ADULT PO), , Disp: , Rfl:   Observations/Objective: Patient is well-developed, well-nourished in no acute distress.  Resting comfortably at home.  Head is normocephalic, atraumatic.  No labored breathing. Speech is clear and coherent with logical content.  Patient is alert and oriented at baseline.  Papulovesicular, erythematous rash noted of face, worse on L-side of cheek and extending up her nose. Isolated spots of R side of face, L anterior shoulder and R forearm (scabbing noted here from prior scratching)  Assessment and Plan: 1. Poison ivy dermatitis (Primary) - predniSONE  (DELTASONE ) 10 MG tablet; Take 4 tablets (40 mg total) by mouth daily with breakfast for 4 days, THEN 3 tablets (30 mg total) daily with breakfast for 4 days, THEN 2 tablets (20 mg total) daily with breakfast for 3 days, THEN 1 tablet (10 mg total) daily with breakfast for 3 days.  Dispense: 37 tablet; Refill: 0 - triamcinolone  cream (KENALOG ) 0.1 %; Apply 1 Application topically 2 (two) times daily.  Dispense: 30 g; Refill: 0  Supportive measures and OTC medications reviewed. Triamcinolone  topical for non-facial lesions.  Continue Zyrtec. Continue OTC cortisone for facial rash. Prednisone  taper per orders. In person evaluation for any non-resolving, new or worsening symptoms despite treatment.  Follow Up Instructions: I  discussed the assessment and treatment plan with the patient. The patient was provided an opportunity to ask questions and all were answered. The patient agreed with the plan and demonstrated an understanding of the instructions.  A copy of instructions were sent to the patient via MyChart unless otherwise noted below.   The patient was advised to call back or seek an in-person evaluation if the symptoms worsen or if the condition fails to improve as anticipated.    Megan Velma Lunger, PA-C

## 2024-10-20 ENCOUNTER — Ambulatory Visit: Payer: Commercial Managed Care - PPO | Admitting: Plastic Surgery

## 2024-10-25 DIAGNOSIS — R7309 Other abnormal glucose: Secondary | ICD-10-CM | POA: Diagnosis not present

## 2024-12-01 ENCOUNTER — Encounter: Payer: Self-pay | Admitting: Plastic Surgery

## 2024-12-01 ENCOUNTER — Ambulatory Visit: Admitting: Plastic Surgery

## 2024-12-01 DIAGNOSIS — Z170 Estrogen receptor positive status [ER+]: Secondary | ICD-10-CM

## 2024-12-01 DIAGNOSIS — Z9013 Acquired absence of bilateral breasts and nipples: Secondary | ICD-10-CM | POA: Diagnosis not present

## 2024-12-01 DIAGNOSIS — Z853 Personal history of malignant neoplasm of breast: Secondary | ICD-10-CM

## 2024-12-01 DIAGNOSIS — Z08 Encounter for follow-up examination after completed treatment for malignant neoplasm: Secondary | ICD-10-CM | POA: Diagnosis not present

## 2024-12-01 NOTE — Progress Notes (Signed)
 "    Patient ID: Megan [name], female    DOB: September 27, 1971, 53 y.o.   MRN: [id number]   Chief Complaint  Patient presents with   Follow-up   Breast Problem    The patient is a 53 year old female here for follow-up for 1 year breast reconstruction.  In 2021 she was diagnosed with ductal carcinoma in situ and underwent bilateral mastectomies.  In July 2021 the Mentor smooth high-profile extra gel for 150 cc implants were placed.  Her weight has changed a little bit but her implants look good.  She has still a little bit of loss of the medial pole fullness both sides but particularly on the right.  There are no areas of concern.    Review of Systems  Constitutional: Negative.   HENT: Negative.    Eyes: Negative.   Respiratory: Negative.    Cardiovascular: Negative.   Gastrointestinal: Negative.   Endocrine: Negative.   Genitourinary: Negative.   Musculoskeletal: Negative.     Past Medical History:  Diagnosis Date   Allergic rhinitis    Allergy    Breast cancer (HCC)    BIL BREAST   Family history of breast cancer    Family history of colon cancer    Family history of prostate cancer    GERD (gastroesophageal reflux disease)    MRSA infection 2017   left buttock   PONV (postoperative nausea and vomiting)    SVD (spontaneous vaginal delivery)    x 1   TB lung, latent    +  quantiferon, neg PPD - s/p treatment - completed    neg cxr    Past Surgical History:  Procedure Laterality Date   BILIARY DILATION  05/30/2020   Procedure: BILIARY DILATION;  Surgeon: Charlanne Groom, MD;  Location: [facility];  Service: Endoscopy;;   BREAST BIOPSY Right 08/24/2012   benign   BREAST RECONSTRUCTION WITH PLACEMENT OF TISSUE EXPANDER AND FLEX HD (ACELLULAR HYDRATED DERMIS) Bilateral 04/08/2020   Procedure: BILATERAL BREAST RECONSTRUCTION WITH PLACEMENT OF TISSUE EXPANDER AND FLEX HD (ACELLULAR HYDRATED DERMIS);  Surgeon: Lowery Estefana RAMAN, DO;  Location: [facility];   Service: Plastics;  Laterality: Bilateral;   CESAREAN SECTION     x 1   CHOLECYSTECTOMY N/A 06/14/2020   Procedure: LAPAROSCOPIC CHOLECYSTECTOMY WITH ATTEMPTED INTRAOPERATIVE CHOLANGIOGRAM;  Surgeon: Curvin Deward MOULD, MD;  Location: [facility];  Service: General;  Laterality: N/A;   DILATION AND CURETTAGE OF UTERUS     x 2   ENDOSCOPIC RETROGRADE CHOLANGIOPANCREATOGRAPHY (ERCP) WITH PROPOFOL  N/A 05/30/2020   Procedure: ENDOSCOPIC RETROGRADE CHOLANGIOPANCREATOGRAPHY (ERCP) WITH PROPOFOL ;  Surgeon: Charlanne Groom, MD;  Location: [facility];  Service: Endoscopy;  Laterality: N/A;   NIPPLE SPARING MASTECTOMY WITH SENTINEL LYMPH NODE BIOPSY Bilateral 04/08/2020   Procedure: RIGHT NIPPLE SPARING MASTECTOMY WITH RIGHT SENTINEL LYMPH NODE BIOPSY, LEFT PROPHYLACTIC MASTECTOMY;  Surgeon: Curvin Deward MOULD, MD;  Location: [facility];  Service: General;  Laterality: Bilateral;   REMOVAL OF BILATERAL TISSUE EXPANDERS WITH PLACEMENT OF BILATERAL BREAST IMPLANTS Bilateral 07/10/2020   Procedure: REMOVAL OF BILATERAL TISSUE EXPANDERS WITH PLACEMENT OF BILATERAL BREAST IMPLANTS;  Surgeon: Lowery Estefana RAMAN, DO;  Location: [facility];  Service: Plastics;  Laterality: Bilateral;   REMOVAL OF STONES  05/30/2020   Procedure: REMOVAL OF STONES;  Surgeon: Charlanne Groom, MD;  Location: [facility];  Service: Endoscopy;;   SPHINCTEROTOMY  05/30/2020   Procedure: ANNETT;  Surgeon: Charlanne Groom, MD;  Location: [facility];  Service: Endoscopy;;  WISDOM TOOTH EXTRACTION       Current Medications[1]   Objective:   There were no vitals filed for this visit.  Physical Exam Vitals reviewed.  Constitutional:      Appearance: Normal appearance.  HENT:     Head: Atraumatic.  Cardiovascular:     Rate and Rhythm: Normal rate.     Pulses: Normal pulses.  Pulmonary:     Effort: Pulmonary effort is normal.  Abdominal:     Palpations: Abdomen is soft.  Musculoskeletal:        General: No  swelling or deformity.  Skin:    General: Skin is warm.     Capillary Refill: Capillary refill takes less than 2 seconds.     Coloration: Skin is not jaundiced.  Neurological:     Mental Status: She is alert and oriented to person, place, and time.  Psychiatric:        Mood and Affect: Mood normal.        Behavior: Behavior normal.        Judgment: Judgment normal.     Assessment & Plan:  Malignant neoplasm of upper-outer quadrant of right breast in female, estrogen receptor positive (HCC)  Patient is doing really well and pleased with her results.  We took pictures and put them in the chart with her permission.  She feels very comfortable with the implants at this time.  She understands that an ultrasound is recommended every 3 to 4 years.  She is fine with waiting until next year and talking about it some more.  If there were any concern we could certainly go to an MRI as well.  Continue with light massage and let me know if anything changes otherwise we will see her back in 1 year.  Pictures were obtained of the patient and placed in the chart with the patient's or guardian's permission.   Estefana RAMAN Sindy Mccune, DO    [1]  Current Outpatient Medications:    cholecalciferol (VITAMIN D3) 25 MCG (1000 UNIT) tablet, , Disp: , Rfl:    Fezolinetant  (VEOZAH ) 45 MG TABS, Take 1 tablet (45 mg total) by mouth daily., Disp: 30 tablet, Rfl: 12   levonorgestrel  (MIRENA ) 20 MCG/24HR IUD, 1 each by Intrauterine route once., Disp: , Rfl:    Multiple Vitamin (MULTIVITAMIN ADULT PO), , Disp: , Rfl:   "

## 2024-12-12 ENCOUNTER — Ambulatory Visit: Admitting: Plastic Surgery

## 2024-12-15 ENCOUNTER — Other Ambulatory Visit (HOSPITAL_COMMUNITY): Payer: Self-pay

## 2024-12-21 ENCOUNTER — Encounter: Payer: Self-pay | Admitting: Internal Medicine

## 2024-12-21 ENCOUNTER — Ambulatory Visit: Payer: Self-pay | Admitting: Internal Medicine

## 2024-12-21 VITALS — BP 130/84 | HR 76 | Temp 98.2°F | Ht 66.0 in | Wt 168.3 lb

## 2024-12-21 DIAGNOSIS — E785 Hyperlipidemia, unspecified: Secondary | ICD-10-CM | POA: Diagnosis not present

## 2024-12-21 DIAGNOSIS — Z9889 Other specified postprocedural states: Secondary | ICD-10-CM | POA: Diagnosis not present

## 2024-12-21 DIAGNOSIS — E559 Vitamin D deficiency, unspecified: Secondary | ICD-10-CM

## 2024-12-21 DIAGNOSIS — Z0000 Encounter for general adult medical examination without abnormal findings: Secondary | ICD-10-CM | POA: Diagnosis not present

## 2024-12-21 DIAGNOSIS — E538 Deficiency of other specified B group vitamins: Secondary | ICD-10-CM | POA: Diagnosis not present

## 2024-12-21 DIAGNOSIS — Z86000 Personal history of in-situ neoplasm of breast: Secondary | ICD-10-CM | POA: Diagnosis not present

## 2024-12-21 LAB — LIPID PANEL
Cholesterol: 207 mg/dL — ABNORMAL HIGH (ref 28–200)
HDL: 38.6 mg/dL — ABNORMAL LOW
LDL Cholesterol: 130 mg/dL — ABNORMAL HIGH (ref 10–99)
NonHDL: 168.86
Total CHOL/HDL Ratio: 5
Triglycerides: 194 mg/dL — ABNORMAL HIGH (ref 10.0–149.0)
VLDL: 38.8 mg/dL (ref 0.0–40.0)

## 2024-12-21 LAB — COMPREHENSIVE METABOLIC PANEL WITH GFR
ALT: 15 U/L (ref 3–35)
AST: 18 U/L (ref 5–37)
Albumin: 4.8 g/dL (ref 3.5–5.2)
Alkaline Phosphatase: 67 U/L (ref 39–117)
BUN: 14 mg/dL (ref 6–23)
CO2: 25 meq/L (ref 19–32)
Calcium: 9.8 mg/dL (ref 8.4–10.5)
Chloride: 106 meq/L (ref 96–112)
Creatinine, Ser: 0.71 mg/dL (ref 0.40–1.20)
GFR: 98.54 mL/min
Glucose, Bld: 115 mg/dL — ABNORMAL HIGH (ref 70–99)
Potassium: 3.8 meq/L (ref 3.5–5.1)
Sodium: 140 meq/L (ref 135–145)
Total Bilirubin: 0.5 mg/dL (ref 0.2–1.2)
Total Protein: 7.5 g/dL (ref 6.0–8.3)

## 2024-12-21 LAB — CBC WITH DIFFERENTIAL/PLATELET
Basophils Absolute: 0.1 K/uL (ref 0.0–0.1)
Basophils Relative: 0.9 % (ref 0.0–3.0)
Eosinophils Absolute: 0.2 K/uL (ref 0.0–0.7)
Eosinophils Relative: 2.2 % (ref 0.0–5.0)
HCT: 41 % (ref 36.0–46.0)
Hemoglobin: 13.9 g/dL (ref 12.0–15.0)
Lymphocytes Relative: 31.7 % (ref 12.0–46.0)
Lymphs Abs: 2.2 K/uL (ref 0.7–4.0)
MCHC: 33.8 g/dL (ref 30.0–36.0)
MCV: 90.7 fl (ref 78.0–100.0)
Monocytes Absolute: 0.6 K/uL (ref 0.1–1.0)
Monocytes Relative: 8.5 % (ref 3.0–12.0)
Neutro Abs: 4 K/uL (ref 1.4–7.7)
Neutrophils Relative %: 56.7 % (ref 43.0–77.0)
Platelets: 318 K/uL (ref 150.0–400.0)
RBC: 4.52 Mil/uL (ref 3.87–5.11)
RDW: 13.1 % (ref 11.5–15.5)
WBC: 7.1 K/uL (ref 4.0–10.5)

## 2024-12-21 LAB — HEMOGLOBIN A1C: Hgb A1c MFr Bld: 5.6 % (ref 4.6–6.5)

## 2024-12-21 LAB — VITAMIN B12: Vitamin B-12: 327 pg/mL (ref 211–911)

## 2024-12-21 LAB — TSH: TSH: 3.35 u[IU]/mL (ref 0.35–5.50)

## 2024-12-21 LAB — VITAMIN D 25 HYDROXY (VIT D DEFICIENCY, FRACTURES): VITD: 35.08 ng/mL (ref 30.00–100.00)

## 2024-12-21 NOTE — Progress Notes (Signed)
 "    Established Patient Office Visit     CC/Reason for Visit: Annual preventive exam  HPI: Megan [name] is a 54 y.o. female who is coming in today for the above mentioned reasons. Past Medical History is significant for: Right DCIS status post bilateral mastectomy with reconstruction in 2021.  Vitamin D  and B12 deficiencies.  Feeling well, no acute concerns or complaints.  Has routine eye and dental care.  Is due for COVID, pneumonia, shingles vaccines.  Will be seeing GYN later this year.   Past Medical/Surgical History: Past Medical History:  Diagnosis Date   Allergic rhinitis    Allergy    Breast cancer (HCC)    BIL BREAST   Family history of breast cancer    Family history of colon cancer    Family history of prostate cancer    GERD (gastroesophageal reflux disease)    MRSA infection 2017   left buttock   PONV (postoperative nausea and vomiting)    SVD (spontaneous vaginal delivery)    x 1   TB lung, latent    +  quantiferon, neg PPD - s/p treatment - completed    neg cxr    Past Surgical History:  Procedure Laterality Date   BILIARY DILATION  05/30/2020   Procedure: BILIARY DILATION;  Surgeon: Charlanne Groom, MD;  Location: [facility];  Service: Endoscopy;;   BREAST BIOPSY Right 08/24/2012   benign   BREAST RECONSTRUCTION WITH PLACEMENT OF TISSUE EXPANDER AND FLEX HD (ACELLULAR HYDRATED DERMIS) Bilateral 04/08/2020   Procedure: BILATERAL BREAST RECONSTRUCTION WITH PLACEMENT OF TISSUE EXPANDER AND FLEX HD (ACELLULAR HYDRATED DERMIS);  Surgeon: Lowery Estefana RAMAN, DO;  Location: [facility];  Service: Plastics;  Laterality: Bilateral;   CESAREAN SECTION     x 1   CHOLECYSTECTOMY N/A 06/14/2020   Procedure: LAPAROSCOPIC CHOLECYSTECTOMY WITH ATTEMPTED INTRAOPERATIVE CHOLANGIOGRAM;  Surgeon: Curvin Deward MOULD, MD;  Location: [facility];  Service: General;  Laterality: N/A;   DILATION AND CURETTAGE OF UTERUS     x 2   ENDOSCOPIC RETROGRADE  CHOLANGIOPANCREATOGRAPHY (ERCP) WITH PROPOFOL  N/A 05/30/2020   Procedure: ENDOSCOPIC RETROGRADE CHOLANGIOPANCREATOGRAPHY (ERCP) WITH PROPOFOL ;  Surgeon: Charlanne Groom, MD;  Location: [facility];  Service: Endoscopy;  Laterality: N/A;   NIPPLE SPARING MASTECTOMY WITH SENTINEL LYMPH NODE BIOPSY Bilateral 04/08/2020   Procedure: RIGHT NIPPLE SPARING MASTECTOMY WITH RIGHT SENTINEL LYMPH NODE BIOPSY, LEFT PROPHYLACTIC MASTECTOMY;  Surgeon: Curvin Deward MOULD, MD;  Location: [facility];  Service: General;  Laterality: Bilateral;   REMOVAL OF BILATERAL TISSUE EXPANDERS WITH PLACEMENT OF BILATERAL BREAST IMPLANTS Bilateral 07/10/2020   Procedure: REMOVAL OF BILATERAL TISSUE EXPANDERS WITH PLACEMENT OF BILATERAL BREAST IMPLANTS;  Surgeon: Lowery Estefana RAMAN, DO;  Location: [facility];  Service: Plastics;  Laterality: Bilateral;   REMOVAL OF STONES  05/30/2020   Procedure: REMOVAL OF STONES;  Surgeon: Charlanne Groom, MD;  Location: [facility];  Service: Endoscopy;;   SPHINCTEROTOMY  05/30/2020   Procedure: ANNETT;  Surgeon: Charlanne Groom, MD;  Location: [facility];  Service: Endoscopy;;   WISDOM TOOTH EXTRACTION      Social History:  reports that she has never smoked. She has never used smokeless tobacco. She reports that she does not currently use alcohol. She reports that she does not use drugs.  Allergies: Allergies[1]  Family History:  Family History  Problem Relation Age of Onset   Hypertension Mother    Breast cancer Mother 29   Hyperlipidemia Father    Diabetes  Father    Crohn's disease Father    Stroke Maternal Grandmother    Diabetes Maternal Grandmother    Prostate cancer Paternal Uncle    Colon cancer Paternal Grandmother    Breast cancer Other    Rectal cancer Neg Hx    Stomach cancer Neg Hx    Esophageal cancer Neg Hx     Current Medications[2]  Review of Systems:  Negative unless indicated in HPI.   Physical Exam: Vitals:   12/21/24  0711  BP: 130/84  Pulse: 76  Temp: 98.2 F (36.8 C)  TempSrc: Oral  SpO2: 98%  Weight: 168 lb 4.8 oz (76.3 kg)  Height: 5' 6 (1.676 m)    Body mass index is 27.16 kg/m.   Physical Exam Vitals reviewed.  Constitutional:      General: She is not in acute distress.    Appearance: Normal appearance. She is not ill-appearing, toxic-appearing or diaphoretic.  HENT:     Head: Normocephalic.     Right Ear: Tympanic membrane, ear canal and external ear normal. There is no impacted cerumen.     Left Ear: Tympanic membrane, ear canal and external ear normal. There is no impacted cerumen.     Nose: Nose normal.     Mouth/Throat:     Mouth: Mucous membranes are moist.     Pharynx: Oropharynx is clear. No oropharyngeal exudate or posterior oropharyngeal erythema.  Eyes:     General: No scleral icterus.       Right eye: No discharge.        Left eye: No discharge.     Conjunctiva/sclera: Conjunctivae normal.     Pupils: Pupils are equal, round, and reactive to light.  Neck:     Vascular: No carotid bruit.  Cardiovascular:     Rate and Rhythm: Normal rate and regular rhythm.     Pulses: Normal pulses.     Heart sounds: Normal heart sounds.  Pulmonary:     Effort: Pulmonary effort is normal. No respiratory distress.     Breath sounds: Normal breath sounds.  Abdominal:     General: Abdomen is flat. Bowel sounds are normal.     Palpations: Abdomen is soft.  Musculoskeletal:        General: Normal range of motion.     Cervical back: Normal range of motion.  Skin:    General: Skin is warm and dry.  Neurological:     General: No focal deficit present.     Mental Status: She is alert and oriented to person, place, and time. Mental status is at baseline.  Psychiatric:        Mood and Affect: Mood normal.        Behavior: Behavior normal.        Thought Content: Thought content normal.        Judgment: Judgment normal.      Impression and Plan:  Encounter for preventive health  examination  Vitamin D  deficiency -     VITAMIN D  25 Hydroxy (Vit-D Deficiency, Fractures); Future  Hyperlipidemia, unspecified hyperlipidemia type -     Lipid panel; Future  Vitamin B12 deficiency -     Vitamin B12; Future  History of ductal carcinoma in situ (DCIS) of breast -     CBC with Differential/Platelet; Future -     Comprehensive metabolic panel with GFR; Future -     TSH; Future -     Hemoglobin A1c; Future  S/P breast reconstruction, bilateral   -  Recommend routine eye and dental care. -Healthy lifestyle discussed in detail. -Labs to be updated today. -Prostate cancer screening: Not applicable Health Maintenance  Topic Date Due   Hepatitis B Vaccine (1 of 3 - 19+ 3-dose series) Never done   Zoster (Shingles) Vaccine (1 of 2) Never done   Pap with HPV screening  04/25/2018   Pneumococcal Vaccine for age over 43 (1 of 1 - PCV) Never done   COVID-19 Vaccine (3 - 2025-26 season) 08/14/2024   DTaP/Tdap/Td vaccine (2 - Td or Tdap) 01/22/2025   Colon Cancer Screening  12/01/2028   Flu Shot  Completed   Hepatitis C Screening  Completed   HIV Screening  Completed   HPV Vaccine  Aged Out   Meningitis B Vaccine  Aged Out   Breast Cancer Screening  Discontinued     -Declines vaccines today but will obtain at a later date.    Tully Theophilus Andrews, MD [facility]     [1] No Known Allergies [2]  Current Outpatient Medications:    Biotin 5 MG TABS, Take by mouth., Disp: , Rfl:    cholecalciferol (VITAMIN D3) 25 MCG (1000 UNIT) tablet, , Disp: , Rfl:    Fezolinetant  (VEOZAH ) 45 MG TABS, Take 1 tablet (45 mg total) by mouth daily., Disp: 30 tablet, Rfl: 12   fluticasone  (FLONASE ) 50 MCG/ACT nasal spray, Place into both nostrils daily., Disp: , Rfl:    levonorgestrel  (MIRENA ) 20 MCG/24HR IUD, 1 each by Intrauterine route once., Disp: , Rfl:    Multiple Vitamin (MULTIVITAMIN ADULT PO), , Disp: , Rfl:   "

## 2024-12-25 ENCOUNTER — Ambulatory Visit: Payer: Self-pay | Admitting: Internal Medicine

## 2024-12-25 DIAGNOSIS — E785 Hyperlipidemia, unspecified: Secondary | ICD-10-CM

## 2024-12-25 MED ORDER — ROSUVASTATIN CALCIUM 5 MG PO TABS: 5.0000 mg | ORAL_TABLET | Freq: Every day | ORAL | 1 refills | Status: AC

## 2025-11-30 ENCOUNTER — Ambulatory Visit: Admitting: Plastic Surgery
